# Patient Record
Sex: Male | Born: 1935 | Race: White | Hispanic: No | Smoking: Never smoker
Health system: Southern US, Community
[De-identification: ages and names within clinical notes are randomized; demographics above are authoritative.]

## PROBLEM LIST (undated history)

## (undated) DIAGNOSIS — I6529 Occlusion and stenosis of unspecified carotid artery: Secondary | ICD-10-CM

## (undated) DIAGNOSIS — C7951 Secondary malignant neoplasm of bone: Secondary | ICD-10-CM

## (undated) DIAGNOSIS — K589 Irritable bowel syndrome without diarrhea: Secondary | ICD-10-CM

## (undated) DIAGNOSIS — N400 Enlarged prostate without lower urinary tract symptoms: Secondary | ICD-10-CM

## (undated) DIAGNOSIS — L570 Actinic keratosis: Secondary | ICD-10-CM

## (undated) DIAGNOSIS — K635 Polyp of colon: Secondary | ICD-10-CM

## (undated) DIAGNOSIS — C61 Malignant neoplasm of prostate: Secondary | ICD-10-CM

## (undated) DIAGNOSIS — Z8546 Personal history of malignant neoplasm of prostate: Secondary | ICD-10-CM

## (undated) DIAGNOSIS — H908 Mixed conductive and sensorineural hearing loss, unspecified: Secondary | ICD-10-CM

## (undated) DIAGNOSIS — M1990 Unspecified osteoarthritis, unspecified site: Secondary | ICD-10-CM

## (undated) DIAGNOSIS — E785 Hyperlipidemia, unspecified: Secondary | ICD-10-CM

## (undated) DIAGNOSIS — R972 Elevated prostate specific antigen [PSA]: Secondary | ICD-10-CM

## (undated) HISTORY — DX: Polyp of colon: K63.5

## (undated) HISTORY — DX: Unspecified osteoarthritis, unspecified site: M19.90

## (undated) HISTORY — DX: Irritable bowel syndrome, unspecified: K58.9

## (undated) HISTORY — DX: Benign prostatic hyperplasia without lower urinary tract symptoms: N40.0

## (undated) HISTORY — DX: Actinic keratosis: L57.0

## (undated) HISTORY — DX: Hyperlipidemia, unspecified: E78.5

## (undated) HISTORY — DX: Mixed conductive and sensorineural hearing loss, unspecified: H90.8

## (undated) HISTORY — PX: CARDIOVASCULAR STRESS TEST: SHX262

## (undated) HISTORY — DX: Elevated prostate specific antigen (PSA): R97.20

---

## 2000-07-25 ENCOUNTER — Encounter: Payer: Self-pay | Admitting: Urology

## 2000-07-29 ENCOUNTER — Encounter (INDEPENDENT_AMBULATORY_CARE_PROVIDER_SITE_OTHER): Payer: Self-pay | Admitting: Specialist

## 2000-07-29 ENCOUNTER — Inpatient Hospital Stay (HOSPITAL_COMMUNITY): Admission: RE | Admit: 2000-07-29 | Discharge: 2000-07-31 | Payer: Self-pay | Admitting: Urology

## 2000-07-29 HISTORY — PX: TRANSURETHRAL RESECTION OF PROSTATE: SHX73

## 2004-09-26 ENCOUNTER — Encounter: Admission: RE | Admit: 2004-09-26 | Discharge: 2004-09-26 | Payer: Self-pay | Admitting: Urology

## 2004-10-08 ENCOUNTER — Encounter: Admission: RE | Admit: 2004-10-08 | Discharge: 2004-10-08 | Payer: Self-pay | Admitting: Urology

## 2005-03-19 ENCOUNTER — Ambulatory Visit: Payer: Self-pay | Admitting: Internal Medicine

## 2005-03-29 ENCOUNTER — Ambulatory Visit: Payer: Self-pay | Admitting: Internal Medicine

## 2005-09-20 ENCOUNTER — Ambulatory Visit: Payer: Self-pay | Admitting: Internal Medicine

## 2005-09-27 ENCOUNTER — Ambulatory Visit: Payer: Self-pay | Admitting: Internal Medicine

## 2006-01-17 ENCOUNTER — Ambulatory Visit: Payer: Self-pay | Admitting: Internal Medicine

## 2006-02-11 ENCOUNTER — Inpatient Hospital Stay (HOSPITAL_COMMUNITY): Admission: RE | Admit: 2006-02-11 | Discharge: 2006-02-15 | Payer: Self-pay | Admitting: Orthopedic Surgery

## 2006-02-11 HISTORY — PX: TOTAL KNEE ARTHROPLASTY: SHX125

## 2006-03-20 ENCOUNTER — Encounter
Admission: RE | Admit: 2006-03-20 | Discharge: 2006-06-18 | Payer: Self-pay | Admitting: Physical Medicine & Rehabilitation

## 2006-03-20 ENCOUNTER — Ambulatory Visit: Payer: Self-pay | Admitting: Physical Medicine & Rehabilitation

## 2006-05-13 ENCOUNTER — Ambulatory Visit: Payer: Self-pay | Admitting: Internal Medicine

## 2006-05-20 ENCOUNTER — Ambulatory Visit: Payer: Self-pay | Admitting: Internal Medicine

## 2007-01-01 ENCOUNTER — Ambulatory Visit: Payer: Self-pay | Admitting: Internal Medicine

## 2007-01-01 LAB — CONVERTED CEMR LAB
ALT: 17 units/L (ref 0–40)
AST: 21 units/L (ref 0–37)
Albumin: 4.1 g/dL (ref 3.5–5.2)
Alkaline Phosphatase: 67 units/L (ref 39–117)
BUN: 25 mg/dL — ABNORMAL HIGH (ref 6–23)
GFR calc non Af Amer: 68 mL/min
Glomerular Filtration Rate, Af Am: 82 mL/min/{1.73_m2}
HCT: 39.2 % (ref 39.0–52.0)
MCHC: 35.4 g/dL (ref 30.0–36.0)
Potassium: 4.1 meq/L (ref 3.5–5.1)
RDW: 12.9 % (ref 11.5–14.6)
Sodium: 137 meq/L (ref 135–145)
Total Bilirubin: 0.7 mg/dL (ref 0.3–1.2)
Total Protein: 6.8 g/dL (ref 6.0–8.3)
WBC: 5.9 10*3/uL (ref 4.5–10.5)

## 2007-01-02 ENCOUNTER — Ambulatory Visit: Payer: Self-pay | Admitting: Internal Medicine

## 2007-05-12 ENCOUNTER — Inpatient Hospital Stay (HOSPITAL_COMMUNITY): Admission: RE | Admit: 2007-05-12 | Discharge: 2007-05-15 | Payer: Self-pay | Admitting: Neurosurgery

## 2007-05-12 HISTORY — PX: OTHER SURGICAL HISTORY: SHX169

## 2007-07-07 ENCOUNTER — Ambulatory Visit: Payer: Self-pay | Admitting: Internal Medicine

## 2007-07-08 LAB — CONVERTED CEMR LAB
AST: 21 units/L (ref 0–37)
Albumin: 4.4 g/dL (ref 3.5–5.2)
Amylase: 109 units/L (ref 27–131)
CK-MB: 3.7 ng/mL (ref 0.3–4.0)
Total Bilirubin: 0.8 mg/dL (ref 0.3–1.2)
Total Protein: 7 g/dL (ref 6.0–8.3)

## 2007-07-09 ENCOUNTER — Encounter: Payer: Self-pay | Admitting: Internal Medicine

## 2007-08-19 ENCOUNTER — Ambulatory Visit: Payer: Self-pay | Admitting: Internal Medicine

## 2007-08-19 DIAGNOSIS — M545 Low back pain: Secondary | ICD-10-CM

## 2007-08-19 DIAGNOSIS — N400 Enlarged prostate without lower urinary tract symptoms: Secondary | ICD-10-CM

## 2007-08-19 DIAGNOSIS — E785 Hyperlipidemia, unspecified: Secondary | ICD-10-CM

## 2007-08-19 DIAGNOSIS — K219 Gastro-esophageal reflux disease without esophagitis: Secondary | ICD-10-CM

## 2007-08-19 HISTORY — DX: Hyperlipidemia, unspecified: E78.5

## 2007-12-17 ENCOUNTER — Encounter: Payer: Self-pay | Admitting: Internal Medicine

## 2007-12-18 ENCOUNTER — Ambulatory Visit: Payer: Self-pay | Admitting: Internal Medicine

## 2007-12-18 LAB — CONVERTED CEMR LAB: LDL Goal: 160 mg/dL

## 2007-12-25 ENCOUNTER — Telehealth (INDEPENDENT_AMBULATORY_CARE_PROVIDER_SITE_OTHER): Payer: Self-pay | Admitting: *Deleted

## 2007-12-29 ENCOUNTER — Encounter: Payer: Self-pay | Admitting: Internal Medicine

## 2008-01-13 ENCOUNTER — Telehealth: Payer: Self-pay | Admitting: Internal Medicine

## 2008-01-15 ENCOUNTER — Telehealth: Payer: Self-pay | Admitting: Internal Medicine

## 2008-04-15 ENCOUNTER — Telehealth: Payer: Self-pay | Admitting: Internal Medicine

## 2008-04-15 ENCOUNTER — Ambulatory Visit: Payer: Self-pay | Admitting: Internal Medicine

## 2008-05-31 ENCOUNTER — Ambulatory Visit: Payer: Self-pay | Admitting: Internal Medicine

## 2008-05-31 LAB — CONVERTED CEMR LAB
HDL: 39 mg/dL (ref >39.0)
PSA: 7.88 ng/mL — ABNORMAL HIGH (ref 0.10–4.00)
Total CHOL/HDL Ratio: 4
Triglycerides: 77 mg/dL (ref 0–149)
VLDL: 15 mg/dL (ref 0–40)

## 2008-06-13 ENCOUNTER — Ambulatory Visit: Payer: Self-pay | Admitting: Internal Medicine

## 2008-06-13 DIAGNOSIS — L570 Actinic keratosis: Secondary | ICD-10-CM

## 2008-06-13 HISTORY — DX: Actinic keratosis: L57.0

## 2008-06-16 ENCOUNTER — Encounter: Payer: Self-pay | Admitting: Internal Medicine

## 2008-06-23 ENCOUNTER — Encounter: Payer: Self-pay | Admitting: Internal Medicine

## 2008-10-13 ENCOUNTER — Ambulatory Visit: Payer: Self-pay | Admitting: Internal Medicine

## 2008-12-16 ENCOUNTER — Encounter: Payer: Self-pay | Admitting: Internal Medicine

## 2009-02-10 ENCOUNTER — Ambulatory Visit: Payer: Self-pay | Admitting: Internal Medicine

## 2009-02-10 DIAGNOSIS — C44300 Unspecified malignant neoplasm of skin of unspecified part of face: Secondary | ICD-10-CM | POA: Insufficient documentation

## 2009-06-07 ENCOUNTER — Ambulatory Visit: Payer: Self-pay | Admitting: Internal Medicine

## 2009-06-07 LAB — CONVERTED CEMR LAB
ALT: 21 units/L (ref 0–53)
AST: 24 units/L (ref 0–37)
Albumin: 3.9 g/dL (ref 3.5–5.2)
BUN: 24 mg/dL — ABNORMAL HIGH (ref 6–23)
Basophils Relative: 0 % (ref 0.0–3.0)
Chloride: 111 meq/L (ref 96–112)
Cholesterol: 134 mg/dL (ref 0–200)
Creatinine, Ser: 1 mg/dL (ref 0.4–1.5)
Eosinophils Relative: 1.8 % (ref 0.0–5.0)
GFR calc non Af Amer: 75.49 mL/min (ref >60)
Glucose, Bld: 89 mg/dL (ref 70–99)
HCT: 37.5 % — ABNORMAL LOW (ref 39.0–52.0)
Lymphs Abs: 1.3 10*3/uL (ref 0.7–4.0)
MCHC: 34.8 g/dL (ref 30.0–36.0)
MCV: 90.3 fL (ref 78.0–100.0)
Monocytes Absolute: 0.4 10*3/uL (ref 0.1–1.0)
Neutro Abs: 2.6 10*3/uL (ref 1.4–7.7)
Potassium: 4.1 meq/L (ref 3.5–5.1)
RBC: 4.15 M/uL — ABNORMAL LOW (ref 4.22–5.81)
Triglycerides: 70 mg/dL (ref 0.0–149.0)
WBC: 4.4 10*3/uL — ABNORMAL LOW (ref 4.5–10.5)

## 2009-06-14 ENCOUNTER — Ambulatory Visit: Payer: Self-pay | Admitting: Internal Medicine

## 2009-06-14 LAB — CONVERTED CEMR LAB
LDL Goal: 130 mg/dL
PSA, Free Pct: 8 — ABNORMAL LOW (ref >25)
PSA, Free: 0.8 ng/mL

## 2009-07-25 ENCOUNTER — Telehealth: Payer: Self-pay | Admitting: Internal Medicine

## 2009-07-25 DIAGNOSIS — H908 Mixed conductive and sensorineural hearing loss, unspecified: Secondary | ICD-10-CM | POA: Insufficient documentation

## 2009-07-25 HISTORY — DX: Mixed conductive and sensorineural hearing loss, unspecified: H90.8

## 2009-08-09 ENCOUNTER — Telehealth: Payer: Self-pay | Admitting: *Deleted

## 2009-08-11 ENCOUNTER — Encounter: Payer: Self-pay | Admitting: Internal Medicine

## 2009-11-07 ENCOUNTER — Encounter (INDEPENDENT_AMBULATORY_CARE_PROVIDER_SITE_OTHER): Payer: Self-pay | Admitting: *Deleted

## 2009-12-04 ENCOUNTER — Ambulatory Visit: Payer: Self-pay | Admitting: Internal Medicine

## 2009-12-04 ENCOUNTER — Encounter (INDEPENDENT_AMBULATORY_CARE_PROVIDER_SITE_OTHER): Payer: Self-pay | Admitting: *Deleted

## 2009-12-04 LAB — CONVERTED CEMR LAB
Bilirubin, Direct: 0.2 mg/dL (ref 0.0–0.3)
HDL: 42.8 mg/dL (ref >39.00)
PSA: 10.03 ng/mL — ABNORMAL HIGH (ref 0.10–4.00)
Total Bilirubin: 1.1 mg/dL (ref 0.3–1.2)
Total CHOL/HDL Ratio: 3
Total Protein: 6.8 g/dL (ref 6.0–8.3)
Triglycerides: 89 mg/dL (ref 0.0–149.0)
VLDL: 17.8 mg/dL (ref 0.0–40.0)

## 2009-12-27 ENCOUNTER — Ambulatory Visit: Payer: Self-pay | Admitting: Internal Medicine

## 2010-02-23 ENCOUNTER — Telehealth (INDEPENDENT_AMBULATORY_CARE_PROVIDER_SITE_OTHER): Payer: Self-pay | Admitting: *Deleted

## 2010-06-05 ENCOUNTER — Telehealth: Payer: Self-pay | Admitting: Internal Medicine

## 2010-06-08 ENCOUNTER — Ambulatory Visit: Payer: Self-pay | Admitting: Internal Medicine

## 2010-06-08 LAB — CONVERTED CEMR LAB
Albumin: 4.2 g/dL (ref 3.5–5.2)
Bilirubin, Direct: 0.1 mg/dL (ref 0.0–0.3)
CO2: 31 meq/L (ref 19–32)
Calcium: 9.6 mg/dL (ref 8.4–10.5)
Creatinine, Ser: 1.1 mg/dL (ref 0.4–1.5)
GFR calc non Af Amer: 68.18 mL/min (ref >60)
HDL: 45.9 mg/dL (ref >39.00)
Total CHOL/HDL Ratio: 3
Total Protein: 6.6 g/dL (ref 6.0–8.3)
Triglycerides: 84 mg/dL (ref 0.0–149.0)

## 2010-06-15 ENCOUNTER — Ambulatory Visit: Payer: Self-pay | Admitting: Internal Medicine

## 2010-06-15 DIAGNOSIS — R972 Elevated prostate specific antigen [PSA]: Secondary | ICD-10-CM

## 2010-06-15 HISTORY — DX: Elevated prostate specific antigen (PSA): R97.20

## 2010-06-18 ENCOUNTER — Telehealth: Payer: Self-pay | Admitting: Internal Medicine

## 2010-06-27 ENCOUNTER — Encounter: Payer: Self-pay | Admitting: Internal Medicine

## 2010-09-07 ENCOUNTER — Encounter: Payer: Self-pay | Admitting: Internal Medicine

## 2010-09-10 ENCOUNTER — Telehealth: Payer: Self-pay | Admitting: Internal Medicine

## 2010-09-10 DIAGNOSIS — R0609 Other forms of dyspnea: Secondary | ICD-10-CM

## 2010-09-10 DIAGNOSIS — R0989 Other specified symptoms and signs involving the circulatory and respiratory systems: Secondary | ICD-10-CM

## 2010-09-19 ENCOUNTER — Telehealth (INDEPENDENT_AMBULATORY_CARE_PROVIDER_SITE_OTHER): Payer: Self-pay | Admitting: *Deleted

## 2010-09-20 ENCOUNTER — Encounter: Payer: Self-pay | Admitting: Internal Medicine

## 2010-09-20 ENCOUNTER — Ambulatory Visit: Payer: Self-pay | Admitting: Internal Medicine

## 2010-09-20 ENCOUNTER — Encounter (HOSPITAL_COMMUNITY): Admission: RE | Admit: 2010-09-20 | Discharge: 2010-11-30 | Payer: Self-pay | Admitting: Internal Medicine

## 2010-09-20 ENCOUNTER — Ambulatory Visit: Payer: Self-pay

## 2010-09-21 ENCOUNTER — Encounter: Admission: RE | Admit: 2010-09-21 | Discharge: 2010-09-21 | Payer: Self-pay | Admitting: Neurology

## 2010-09-21 ENCOUNTER — Encounter: Payer: Self-pay | Admitting: Internal Medicine

## 2010-09-24 ENCOUNTER — Encounter: Payer: Self-pay | Admitting: Internal Medicine

## 2010-10-02 ENCOUNTER — Ambulatory Visit: Payer: Self-pay | Admitting: Internal Medicine

## 2010-10-02 DIAGNOSIS — R42 Dizziness and giddiness: Secondary | ICD-10-CM

## 2010-10-04 ENCOUNTER — Encounter: Payer: Self-pay | Admitting: Internal Medicine

## 2010-10-22 ENCOUNTER — Telehealth: Payer: Self-pay | Admitting: Internal Medicine

## 2010-10-23 ENCOUNTER — Telehealth: Payer: Self-pay | Admitting: Internal Medicine

## 2010-11-05 ENCOUNTER — Ambulatory Visit: Payer: Self-pay | Admitting: Internal Medicine

## 2010-12-07 ENCOUNTER — Ambulatory Visit: Payer: Self-pay | Admitting: Internal Medicine

## 2011-01-22 ENCOUNTER — Encounter: Payer: Self-pay | Admitting: *Deleted

## 2011-01-28 ENCOUNTER — Other Ambulatory Visit: Payer: Self-pay | Admitting: Internal Medicine

## 2011-01-28 ENCOUNTER — Ambulatory Visit
Admission: RE | Admit: 2011-01-28 | Discharge: 2011-01-28 | Payer: Self-pay | Source: Home / Self Care | Attending: Internal Medicine | Admitting: Internal Medicine

## 2011-01-28 LAB — BASIC METABOLIC PANEL
BUN: 35 mg/dL — ABNORMAL HIGH (ref 6–23)
Chloride: 101 mEq/L (ref 96–112)
Glucose, Bld: 85 mg/dL (ref 70–99)
Potassium: 4.1 mEq/L (ref 3.5–5.1)

## 2011-01-28 LAB — LIPID PANEL
HDL: 42.9 mg/dL (ref >39.00)
VLDL: 22.2 mg/dL (ref 0.0–40.0)

## 2011-01-28 LAB — HEPATIC FUNCTION PANEL
Bilirubin, Direct: 0.1 mg/dL (ref 0.0–0.3)
Total Bilirubin: 0.7 mg/dL (ref 0.3–1.2)

## 2011-01-29 NOTE — Letter (Signed)
Summary: [facility]  [facility]   Imported By: Maryln Gottron 07/03/2010 09:55:13  _____________________________________________________________________  External Attachment:    Type:   Image     Comment:   External Document

## 2011-01-29 NOTE — Miscellaneous (Signed)
Summary: Appointment Canceled  Appointment status changed to canceled by [employer] on 09/13/2010 10:07 AM.  Cancellation Comments --------------------- crs/ wt 152/780.2/syncope/mt  Appointment Information ----------------------- Appt Type:  CARDIOLOGY NUCLEAR TESTING      Date:  Wednesday, September 19, 2010      Time:  8:30 AM for 15 min   Urgency:  Routine   Made By:  Pearson Grippe  To Visit:  [id number]    Reason:  crs/ wt 152/780.2/syncope/mt  Appt Comments ------------- -- 09/13/10 10:07: (CEMR) CANCELED -- crs/ wt 152/780.2/syncope/mt -- 09/12/10 15:01: (CEMR) BOOKED -- Routine CARDIOLOGY NUCLEAR TESTING at 09/19/2010 8:30 AM for 15 min crs/ wt 152/780.2/syncope/mt -- 09/12/10 14:57: (CEM[facility]) BOOKED -- Routine CARDIOLO

## 2011-01-29 NOTE — Progress Notes (Signed)
  Phone Note Call from Patient   Summary of Call: Refill Jaylyn to [facility]. Initial call taken by: [facility] CMA AAMA,  October 23, 2010 8:57 AM    Prescriptions: JALYN 0.5-0.4 MG CAPS (DUTASTERIDE-TAMSULOSIN HCL) one by mouth daily  #90 x 3   Entered by:   Lynann Beaver CMA AAMA   Authorized by:   Stacie Glaze MD   Signed by:   Lynann Beaver CMA AAMA on 10/23/2010   Method used:   Faxed to ...       [facility] (mail-order)             , [place]         Ph:        Fax: [phone]   RxID:   [id number]  Per pt request.

## 2011-01-29 NOTE — Assessment & Plan Note (Signed)
Summary: 1 month rov/njr   Vital Signs:  Patient profile:   77 year old male Height:      63 inches Weight:      150 pounds BMI:     26.67 Temp:     98.2 degrees F oral Pulse rate:   72 / minute Resp:     14 per minute BP sitting:   130 / 70  (left arm)  Vitals Entered By: Willy Eddy, LPN (November 05, 2010 4:18 PM) CC: roa Is Patient Diabetic? No   Primary Care Provider:  Stacie Glaze MD  CC:  roa.  History of Present Illness: follow up labs for lipids, he has been follow for chronic recurrent "dizzyness" possibly due to medications back pain stable GERD stable prostate symptoms did not respoend to jaylyn and dizzyness worsened  Hyperlipidemia Follow-Up      This is an 77 year old man who presents for Hyperlipidemia follow-up.  The patient denies muscle aches, GI upset, abdominal pain, flushing, itching, constipation, diarrhea, and fatigue.  The patient denies the following symptoms: chest pain/pressure, exercise intolerance, dypsnea, palpitations, syncope, and pedal edema.  Compliance with medications (by patient report) has been near 100%.  Dietary compliance has been good.  The patient reports exercising 3-4X per week.  Adjunctive measures currently used by the patient include fish oil supplements and limiting alcohol consumpton.    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) 7)  Actinic Keratosis, Ear, Left  (ICD-702.0) 8)  Acute Bronchitis  (ICD-466.0) 9)  Benign Prostatic Hypertrophy  (ICD-600.00) 10)  Low Back Pain  (ICD-724.2) 11)  Hyperlipidemia   (ICD-272.4) 12)  Gerd  (ICD-530.81)  Current Problems (verified): 1)  Dizziness, Chronic  (ICD-780.4) 2)  Dyspnea On Exertion  (ICD-786.09) 3)  Syncope  (ICD-780.2) 4)  Prostate Specific Antigen, Elevated, 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 Take 1 Tablet By Mouth Once A Day 2)  Pantoprazole Sodium 40 Mg Tbec (Pantoprazole Sodium) .... One Po  Daily 3)  Celebrex 200 Mg Caps (Celecoxib) .... One By Mouth Daily 4)  Reglan 5 Mg  Tabs (Metoclopramide Hcl) .... Add With Midrin For Headaches As Needed 5)  Midrin 325-65-100 Mg  Caps (Apap-Isometheptene-Dichloral) .... Add With Reglan As Needed Headache 6)  Meclizine Hcl 25 Mg Tabs (Meclizine Hcl) .... One By Mouth Three Times A Day 7)  Carisoprodol 350 Mg Tabs (Carisoprodol) .... One By Mouth Q 8 Hours As Needed Spasm 8)  Jalyn 0.5-0.4 Mg Caps (Dutasteride-Tamsulosin Hcl) .... One By Mouth Daily  Current Medications (verified): 1)  Celebrex 200 Mg Caps (Celecoxib) .... One By Mouth Daily 2)  Midrin 325-65-100 Mg  Caps (Apap-Isometheptene-Dichloral) .... Add With Reglan As  Needed Headache 3)  Meclizine Hcl 25 Mg Tabs (Meclizine Hcl) .... One By Mouth Three Times A Day 4)  Carisoprodol 350 Mg Tabs (Carisoprodol) .... One By Mouth Q 8 Hours As Needed Spasm 5)  Jalyn 0.5-0.4 Mg Caps (Dutasteride-Tamsulosin Hcl) .... One By Mouth Daily  Allergies (verified): No Known Drug Allergies  Past History:  Family History: Last updated: 08/03/2007 Family History of Arthritis Family History Other cancer-Colon, Prostate  Social History: Last updated:  08/03/2007 Never Smoked Alcohol use-no Drug use-no Retired  Risk Factors: Smoking Status: never (11/05/2010)  Past medical, surgical, family and social histories (including risk factors) reviewed, and no changes noted (except as noted below).  Past Medical History: Reviewed history from 10/13/2008 and no changes required. Elevated Psa  ( john wren) Migraine Headaches Lumbar disc dz GERD Hyperlipidemia Low back pain hearing loss prostate cancer  Past Surgical History: Reviewed history from 08/03/2007 and no changes required. Colonoscopy-07/05/2004 Total knee replacement-lt 02/11/2006 Transurethral resection of prostate-1980  Family History: Reviewed history from 08/03/2007 and no changes required. Family History of Arthritis Family History Other cancer-Colon, Prostate  Social History: Reviewed history from 08/03/2007 and no changes required. Never Smoked Alcohol use-no Drug use-no Retired  Review of Systems  The patient denies anorexia, fever, weight loss, weight gain, vision loss, decreased hearing, hoarseness, chest pain, syncope, dyspnea on exertion, peripheral edema, prolonged cough, headaches, hemoptysis, abdominal pain, melena, hematochezia, severe indigestion/heartburn, hematuria, incontinence, genital sores, muscle weakness, suspicious skin lesions, transient blindness, difficulty walking, depression, unusual weight change, abnormal bleeding, enlarged lymph nodes, angioedema, breast masses, and testicular masses.    Physical Exam  General:  Well-developed,well-nourished,in no acute distress; alert,appropriate and cooperative throughout examination Head:  normocephalic and atraumatic.   Eyes:  pupils equal and pupils round.   Ears:  R ear normal and L ear normal.   Nose:  no external deformity and no nasal discharge.   Neck:  No deformities, masses, or tenderness noted. Lungs:  Normal respiratory effort, chest expands symmetrically. Lungs are clear to  auscultation, no crackles or wheezes. Heart:  Normal rate and regular rhythm. S1 and S2 normal without gallop, murmur, click, rub or other extra sounds.   Impression & Recommendations:  Problem # 1:  DIZZINESS, CHRONIC (ICD-780.4)  improved His updated medication list for this problem includes:    Meclizine Hcl 25 Mg Tabs (Meclizine hcl) ..... One by mouth three times a day  Demonstrated maneuvers to self-treat vertigo. Patient to call to be seen if no improvement in 10-14 days, sooner if worse.   Problem # 2:  HYPERLIPIDEMIA (ICD-272.4) Assessment: Unchanged  The following medications were removed from the medication list:    Lipitor 20 Mg Tabs (Atorvastatin calcium) .Marland Kitchen... Take 1 tablet by mouth once a day  Labs Reviewed: SGOT: 21 (06/08/2010)   SGPT: 17 (06/08/2010)  Lipid Goals: Chol Goal: 200 (06/14/2009)   HDL Goal: 40 (06/14/2009)   LDL Goal: 130 (06/14/2009)   TG Goal: 150 (06/14/2009)  Prior 10 Yr Risk Heart Disease: 11 % (06/15/2010)   HDL:45.90 (06/08/2010), 42.80 (12/04/2009)  LDL:94 (06/08/2010), 86 (12/04/2009)  Chol:157 (06/08/2010), 147 (12/04/2009)  Trig:84.0 (06/08/2010), 89.0 (12/04/2009)  Problem # 3:  HYPERLIPIDEMIA (ICD-272.4)  The following medications were removed from the medication list:    Lipitor 20 Mg Tabs (Atorvastatin calcium) .Marland Kitchen... Take 1 tablet by mouth once a day  Labs Reviewed: SGOT: 21 (06/08/2010)   SGPT: 17 (06/08/2010)  Lipid Goals: Chol Goal: 200 (06/14/2009)   HDL Goal: 40 (06/14/2009)  LDL Goal: 130 (06/14/2009)   TG Goal: 150 (06/14/2009)  Prior 10 Yr Risk Heart Disease: 11 % (06/15/2010)   HDL:45.90 (06/08/2010), 42.80 (12/04/2009)  LDL:94 (06/08/2010), 86 (12/04/2009)  Chol:157 (06/08/2010), 147 (12/04/2009)  Trig:84.0 (06/08/2010), 89.0 (12/04/2009)  Problem # 4:  GERD (ICD-530.81)  The following medications were removed from the medication list:    Pantoprazole Sodium 40 Mg Tbec (Pantoprazole sodium) ..... One po   daily  Problem # 5:  LOW BACK PAIN (ICD-724.2)  His updated medication list for this problem includes:    Celebrex 200 Mg Caps (Celecoxib) ..... One by mouth daily    Carisoprodol 350 Mg Tabs (Carisoprodol) ..... One by mouth q 8 hours as needed spasm  Complete Medication List: 1)  Celebrex 200 Mg Caps (Celecoxib) .... One by mouth daily 2)  Midrin 325-65-100 Mg Caps (Apap-isometheptene-dichloral) .... Add with reglan as needed headache 3)  Meclizine Hcl 25 Mg Tabs (Meclizine hcl) .... One by mouth three times a day 4)  Carisoprodol 350 Mg Tabs (Carisoprodol) .... One by mouth q 8 hours as needed spasm 5)  Jalyn 0.5-0.4 Mg Caps (Dutasteride-tamsulosin hcl) .... One by mouth daily  Patient Instructions: 1)  Please schedule a follow-up appointment in 3 months.  Medicare wellness exam fasting 2)  BMP prior to visit, ICD-9:401.90 3)  Hepatic Panel prior to visit, ICD-9:995.20 4)  Lipid Panel prior to visit, ICD-9:272.4 5)  TSH prior to visit, ICD-9:272.4   Orders Added: 1)  Est. Patient Level IV [16109]

## 2011-01-29 NOTE — Letter (Signed)
Summary: [facility]  [facility]   Imported By: Maryln Gottron 09/24/2010 12:35:18  _____________________________________________________________________  External Attachment:    Type:   Image     Comment:   External Document

## 2011-01-29 NOTE — Progress Notes (Signed)
Summary: labs  Phone Note Call from Patient   Caller: Patient Call For: Stacie Glaze MD Complaint: Urinary/GYN Problems Summary of Call: Pt. wants to add a PSA to his labs for Friday, and have it all done at [facility]. Initial call taken by: Lynann Beaver CMA,  June 05, 2010 1:42 PM  Follow-up for Phone Call        please add psa with code of 600.00 Follow-up by: Willy Eddy, LPN,  June 05, 6212 1:46 PM  Additional Follow-up for Phone Call Additional follow up Details #1::        Contacted pt to confirm and appt was changed to [facility] lab for this Friday, 06/08/2010 at 10 am... with PSA added to requested labs.  Additional Follow-up by: Debbra Riding,  June 05, 2010 3:15 PM

## 2011-01-29 NOTE — Progress Notes (Signed)
Summary: copy of labs  Phone Note Call from Patient   Caller: Patient Call For: Stacie Glaze MD Summary of Call: Needs copy of lab results, please. Initial call taken by: Lynann Beaver CMA,  June 18, 2010 10:43 AM  Follow-up for Phone Call        was given at [facility] left message on machine  Follow-up by: Willy Eddy, LPN,  June 18, 2010 12:55 PM

## 2011-01-29 NOTE — Letter (Signed)
Summary: [facility]  [facility]   Imported By: Sherian Rein 11/14/2010 10:00:01  _____________________________________________________________________  External Attachment:    Type:   Image     Comment:   External Document

## 2011-01-29 NOTE — Progress Notes (Signed)
Summary: stress test  Phone Note Call from Patient Call back at Home Phone [phone]   Caller: Patient Call For: Stacie Glaze MD Reason for Call: Refill Medication Summary of Call: Pt wants to see Dr. Lovell Sheehan the week of the 19 th. to discuss scheduling a stress test per Dr. Cherie Ouch suggestion. Initial call taken by: Lynann Beaver CMA,  September 10, 2010 1:48 PM  Follow-up for Phone Call        per dr Tenny Craw- talk with pt and find out his dx and his problem- left message on machine to return cal  Willy Eddy, LPN  September 11, 2010 5:02 PM  Follow-up by: Willy Eddy, LPN,  September 11, 2010 5:02 PM  Additional Follow-up for Phone Call Additional follow up Details #1::        pt is c/o exertional syncope and exertional sob and discussed these signs with dr lewitt and he feels he needs a stress test-pt states he can walk on treadmill-per drjenkins- may have stress myoview Additional Follow-up by: Willy Eddy, LPN,  September 12, 2010 9:07 AM  New Problems: DYSPNEA ON EXERTION (ICD-786.09) SYNCOPE (ICD-780.2)   New Problems: DYSPNEA ON EXERTION (ICD-786.09) SYNCOPE (ICD-780.2)

## 2011-01-29 NOTE — Assessment & Plan Note (Signed)
Summary: follow up/cjr   Vital Signs:  Patient profile:   77 year old male Height:      63 inches Weight:      149 pounds BMI:     26.49 Temp:     98.2 degrees F oral Pulse rate:   72 / minute Resp:     14 per minute BP sitting:   130 / 80  (left arm)  Vitals Entered By: Willy Eddy, LPN (October 02, 2010 4:54 PM) CC: roa--discuss test Is Patient Diabetic? No   Primary Care Octavian Godek:  Stacie Glaze MD  CC:  roa--discuss test.  History of Present Illness: the pt had a slight hypotensive episode post a normal cardiolyte but he is on HCTZ and terozosin for the prostate ( BPH) The pt has episodic dizzyness and vertigo the pt is on hytrin and this may be causing the dizzyness the cardiolyte was to help evaluate the casued of syncopy and presycope     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) 7)  Actinic Keratosis, Ear, Left  (ICD-702.0) 8)  Acute Bronchitis  (ICD-466.0) 9)  Benign Prostatic Hypertrophy  (ICD-600.00) 10)  Low Back Pain  (ICD-724.2) 11)  Hyperlipidemia  (ICD-272.4) 12)  Gerd  (ICD-530.81)  Current Problems (verified): 1)  Dizziness, Chronic  (ICD-780.4) 2)  Dyspnea On Exertion  (ICD-786.09) 3)  Syncope  (ICD-780.2) 4)  Prostate Specific Antigen, Elevated, 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 Take 1 Tablet By Mouth Once A Day 2)  Lipitor 20 Mg  Tabs (Atorvastatin Calcium) .... Take 1 Tablet By Mouth Once A Day 3)  Hytrin 10 Mg  Caps (Terazosin Hcl) .... Take 1 Tablet By Mouth Once A Day 4)  Proscar 5 Mg  Tabs (Finasteride) .... Once Daily 5)  Pantoprazole Sodium 40 Mg Tbec (Pantoprazole Sodium) .... One Po  Daily 6)  Celebrex 200 Mg Caps (Celecoxib) .... One By Mouth Daily 7)  Reglan 5 Mg  Tabs (Metoclopramide Hcl) .... Add With Midrin For Headaches As Needed 8)  Midrin 325-65-100 Mg  Caps (Apap-Isometheptene-Dichloral) .... Add With Reglan As Needed Headache 9)  Terazosin Hcl 10 Mg Caps (Terazosin Hcl) .Marland Kitchen.. 1 Once Daily 10)  Hydroxyzine Hcl 10 Mg Tabs (Hydroxyzine Hcl) .Marland Kitchen.. 1 Once Daily 11)  Carisoprodol 350 Mg Tabs (Carisoprodol) .... One By Mouth Q 8 Hours As Needed Spasm  Current Medications (verified): 1)  Lipitor 20 Mg  Tabs (Atorvastatin Calcium) .... Take 1 Tablet By Mouth Once A Day 2)  Pantoprazole Sodium 40 Mg Tbec (Pantoprazole Sodium) .... One Po  Daily 3)  Celebrex 200 Mg Caps (Celecoxib) .... One By Mouth Daily 4)  Reglan 5 Mg  Tabs (Metoclopramide Hcl) .Marland KitchenMarland KitchenMarland Kitchen  Add With Midrin For Headaches As Needed 5)  Midrin 325-65-100 Mg  Caps (Apap-Isometheptene-Dichloral) .... Add With Reglan As Needed Headache 6)  Meclizine Hcl 25 Mg Tabs (Meclizine Hcl) .... One By Mouth Three Times A Day 7)  Carisoprodol 350 Mg Tabs (Carisoprodol) .... One By Mouth Q 8 Hours As Needed Spasm 8)  Jalyn 0.5-0.4 Mg Caps (Dutasteride-Tamsulosin Hcl) .... One By Mouth Daily  Allergies (verified): No Known Drug Allergies  Past History:  Family History: Last updated: 08/03/2007 Family History of Arthritis Family History Other cancer-Colon, Prostate  Social History: Last updated: 08/03/2007 Never Smoked Alcohol use-no Drug  use-no Retired  Risk Factors: Smoking Status: never (10/02/2010)  Past medical, surgical, family and social histories (including risk factors) reviewed, and no changes noted (except as noted below).  Past Medical History: Reviewed history from 10/13/2008 and no changes required. Elevated Psa  ( john wren) Migraine Headaches Lumbar disc dz GERD Hyperlipidemia Low back pain hearing loss prostate cancer  Past Surgical History: Reviewed history from 08/03/2007 and no changes required. Colonoscopy-07/05/2004 Total knee replacement-lt 02/11/2006 Transurethral resection of prostate-1980  Family History: Reviewed history from 08/03/2007 and no changes required. Family History of Arthritis Family History Other cancer-Colon, Prostate  Social History: Reviewed history from 08/03/2007 and no changes required. Never Smoked Alcohol use-no Drug use-no Retired  Review of Systems  The patient denies anorexia, fever, weight loss, weight gain, vision loss, decreased hearing, hoarseness, chest pain, syncope, dyspnea on exertion, peripheral edema, prolonged cough, headaches, hemoptysis, abdominal pain, melena, hematochezia, severe indigestion/heartburn, hematuria, incontinence, genital sores, muscle weakness, suspicious skin lesions, transient blindness, difficulty walking, depression, unusual weight change, abnormal bleeding, enlarged lymph nodes, angioedema, breast masses, and testicular masses.    Physical Exam  General:  Well-developed,well-nourished,in no acute distress; alert,appropriate and cooperative throughout examination Head:  normocephalic and atraumatic.   Eyes:  pupils equal and pupils round.   Ears:  R ear normal and L ear normal.   Neck:  No deformities, masses, or tenderness noted. Lungs:  Normal respiratory effort, chest expands symmetrically. Lungs are clear to auscultation, no crackles or wheezes. Heart:  Normal rate and regular rhythm. S1 and S2 normal without gallop,  murmur, click, rub or other extra sounds. Abdomen:  soft, non-tender, and normal bowel sounds.   Msk:  no joint swelling and no joint warmth.   Extremities:  No clubbing, cyanosis, edema, or deformity noted with normal full range of motion of all joints.   Neurologic:  alert & oriented X3 and cranial nerves II-XII intact.     Impression & Recommendations:  Problem # 1:  BENIGN PROSTATIC HYPERTROPHY (ICD-600.00)  The following medications were removed from the medication list:    Hytrin 10 Mg Caps (Terazosin hcl) .Marland Kitchen... Take 1 tablet by mouth once a day    Proscar 5 Mg Tabs (Finasteride) ..... Once daily    Terazosin Hcl 10 Mg Caps (Terazosin hcl) .Marland Kitchen... 1 once daily His updated medication list for this problem includes:    Jalyn 0.5-0.4 Mg Caps (Dutasteride-tamsulosin hcl) ..... One by mouth daily  PSA: 12.36 (06/08/2010)   PSA Free: 0.8 (06/14/2009)     Problem # 2:  DIZZINESS, CHRONIC (ICD-780.4)  Demonstrated maneuvers to self-treat vertigo. Patient to call to be seen if no improvement in 10-14 days, sooner if worse.   His updated medication list for this problem includes:    Meclizine Hcl 25 Mg Tabs (Meclizine hcl) ..... One by mouth three times a day  Problem # 3:  HYPERLIPIDEMIA (  ICD-272.4) consider stopping the lipitor, the pt notes the dizzyness  stpped when he stopped the lipitor His updated medication list for this problem includes:    Lipitor 20 Mg Tabs (Atorvastatin calcium) .Marland Kitchen... Take 1 tablet by mouth once a day  Labs Reviewed: SGOT: 21 (06/08/2010)   SGPT: 17 (06/08/2010)  Lipid Goals: Chol Goal: 200 (06/14/2009)   HDL Goal: 40 (06/14/2009)   LDL Goal: 130 (06/14/2009)   TG Goal: 150 (06/14/2009)  Prior 10 Yr Risk Heart Disease: 11 % (06/15/2010)   HDL:45.90 (06/08/2010), 42.80 (12/04/2009)  LDL:94 (06/08/2010), 86 (12/04/2009)  Chol:157 (06/08/2010), 147 (12/04/2009)  Trig:84.0 (06/08/2010), 89.0 (12/04/2009)  Complete Medication List: 1)  Lipitor 20 Mg Tabs  (Atorvastatin calcium) .... Take 1 tablet by mouth once a day 2)  Pantoprazole Sodium 40 Mg Tbec (Pantoprazole sodium) .... One po  daily 3)  Celebrex 200 Mg Caps (Celecoxib) .... One by mouth daily 4)  Reglan 5 Mg Tabs (Metoclopramide hcl) .... Add with midrin for headaches as needed 5)  Midrin 325-65-100 Mg Caps (Apap-isometheptene-dichloral) .... Add with reglan as needed headache 6)  Meclizine Hcl 25 Mg Tabs (Meclizine hcl) .... One by mouth three times a day 7)  Carisoprodol 350 Mg Tabs (Carisoprodol) .... One by mouth q 8 hours as needed spasm 8)  Jalyn 0.5-0.4 Mg Caps (Dutasteride-tamsulosin hcl) .... One by mouth daily  Patient Instructions: 1)  stop the HCTZ and stop the lipitor 2)  change the hytrin adn prosacr to [employer]. 3)  [place] the meclizine three time s day if the dizzyness persists after this intervention 4)  Please schedule a follow-up appointment in 1 month.

## 2011-01-29 NOTE — Progress Notes (Signed)
Summary: leg cramps  Phone Note Call from Patient   Caller: Patient Call For: Stacie Glaze MD Summary of Call: C/o worsening leg cramps. Wants to try cutting Lipitor to every other day- ok with you? ph [phone], lv msg Initial call taken by: Raechel Ache, RN,  February 23, 2010 1:58 PM  Follow-up for Phone Call        may take 1/2 of lipitor-per dr Lovell Sheehan Follow-up by: Willy Eddy, LPN,  February 23, 2010 3:26 PM  Additional Follow-up for Phone Call Additional follow up Details #1::        Phone Call Completed Additional Follow-up by: Raechel Ache, RN,  February 23, 2010 3:34 PM

## 2011-01-29 NOTE — Progress Notes (Signed)
Summary: Nuclear Pre-Procedure  Phone Note Outgoing Call Call back at [facility] Phone [phone]   Call placed by: Stanton Kidney, EMT-P,  September 19, 2010 2:27 PM Call placed to: Patient Action Taken: Phone Call Completed Summary of Call: Reviewed information on [employer] Information Sheet (see scanned document for further details).  Spoke with the Patient.    Nuclear Med Background Indications for Stress Test: Evaluation for Ischemia     Symptoms: DOE, Syncope    Nuclear Pre-Procedure Cardiac Risk Factors: Lipids Height (in): 65

## 2011-01-29 NOTE — Assessment & Plan Note (Signed)
Summary: Cardiology Nuclear Testing  Nuclear Med Background Indications for Stress Test: Evaluation for Ischemia     Symptoms: DOE, Syncope    Nuclear Pre-Procedure Cardiac Risk Factors: Lipids Caffeine/Decaff Intake: None NPO After: 6:00 PM Lungs: clear IV 0.9% NS with Angio Cath: 20g     IV Site: R Hand IV Started by: Doyne Keel, CNMT Chest Size (in) 38     Height (in): 63 Weight (lb): 147 BMI: 26.13 Tech Comments: The patient had some drop in his BP. No other problems with the test. DOD Dr.McLean was consulted and stated that he could go.  Nuclear Med Study 1 or 2 day study:  1 day     Stress Test Type:  Stress Reading MD:  Arvilla Meres, MD     Referring MD:  J.Jenkins Resting Radionuclide:  Technetium 61m Tetrofosmin     Resting Radionuclide Dose:  11.0 mCi  Stress Radionuclide:  Technetium 44m Tetrofosmin     Stress Radionuclide Dose:  32.5 mCi   Stress Protocol Exercise Time (min):  3:00 min     Max HR:  122 bpm     Predicted Max HR:  134 bpm  Max Systolic BP: 177 mm Hg     Percent Max HR:  91.04 %     METS: 4.6 Rate Pressure Product:  16109    Stress Test Technologist:  Milana Na, EMT-P     Nuclear Technologist:  Domenic Polite, CNMT  Rest Procedure  Myocardial perfusion imaging was performed at rest 45 minutes following the intravenous administration of Technetium 46m Tetrofosmin.  Stress Procedure  The patient exercised for 3:00. The patient stopped due to fatigue and denied any chest pain.  There were no significant ST-T wave changes and rare pvcs.  Technetium 89m Tetrofosmin was injected at peak exercise and myocardial perfusion imaging was performed after a brief delay.  QPS Raw Data Images:  Normal; no motion artifact; normal heart/lung ratio. Stress Images:  Normal homogeneous uptake in all areas of the myocardium. Rest Images:  Normal homogeneous uptake in all areas of the myocardium. Subtraction (SDS):  Normal Transient Ischemic  Dilatation:  1.05  (Normal <1.22)  Lung/Heart Ratio:  .22  (Normal <0.45)  Quantitative Gated Spect Images QGS EDV:  78 ml QGS ESV:  25 ml QGS EF:  68 % QGS cine images:  Normal  Findings Normal nuclear study      Overall Impression  Exercise Capacity: Poor exercise capacity. Clinical Symptoms: No chest pain ECG Impression: No significant ST segment change suggestive of ischemia. Overall Impression: Normal stress nuclear study.

## 2011-01-29 NOTE — Assessment & Plan Note (Signed)
Summary: 6 month rov/nrj   Vital Signs:  Patient profile:   77 year old male Height:      65 inches Weight:      152 pounds BMI:     25.39 Temp:     98.2 degrees F oral Pulse rate:   72 / minute Resp:     14 per minute BP sitting:   140 / 64  (left arm)  Vitals Entered By: Willy Eddy, LPN (June 15, 2010 10:48 AM) CC: roa labs, Lipid Management   CC:  roa labs and Lipid Management.  History of Present Illness: pt has been monitering for lipid monitering of labs   Lipid Management History:      Positive NCEP/ATP III risk factors include male age 28 years old or older.  Negative NCEP/ATP III risk factors include non-tobacco-user status, non-hypertensive, no ASHD (atherosclerotic heart disease), no prior stroke/TIA, no peripheral vascular disease, and no history of aortic aneurysm.     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 Take 1 Tablet By Mouth Once A Day 2)  Lipitor 20 Mg  Tabs (Atorvastatin Calcium) .... Take 1 Tablet By Mouth Once A Day 3)  Hytrin 10 Mg  Caps (Terazosin Hcl) .... Take 1 Tablet By Mouth Once A Day 4)  Proscar 5 Mg  Tabs (Finasteride) .... Once Daily 5)  Pantoprazole Sodium 40 Mg Tbec (Pantoprazole Sodium) .... One Po  Daily 6)  Celebrex 200 Mg Caps (Celecoxib) .... One By Mouth Daily 7)  Reglan 5 Mg  Tabs (Metoclopramide Hcl) .... Add With Midrin For Headaches As Needed 8)  Midrin 325-65-100 Mg  Caps (Apap-Isometheptene-Dichloral) .... Add With  Reglan As Needed Headache 9)  Terazosin Hcl 10 Mg Caps (Terazosin Hcl) .Marland Kitchen.. 1 Once Daily 10)  Hydroxyzine Hcl 10 Mg Tabs (Hydroxyzine Hcl) .Marland Kitchen.. 1 Once Daily 11)  Carisoprodol 350 Mg Tabs (Carisoprodol) .... One By Mouth Q 8 Hours As Needed Spasm  Allergies (verified): No Known Drug Allergies  Past History:  Family History: Last updated: 08/03/2007 Family History of Arthritis Family History Other cancer-Colon, Prostate  Social History: Last updated: 08/03/2007 Never Smoked Alcohol use-no Drug use-no Retired  Risk Factors: Smoking Status: never (06/15/2010)  Past medical, surgical, family and social histories (including risk factors) reviewed, and no changes noted (except as noted below).  Past Medical History: Reviewed history from 10/13/2008 and no changes required. Elevated Psa  ( Aron Inge wren) Migraine Headaches Lumbar disc dz GERD Hyperlipidemia Low back pain hearing loss prostate cancer  Past Surgical History: Reviewed history from 08/03/2007 and no changes required. Colonoscopy-07/05/2004 Total knee replacement-lt 02/11/2006 Transurethral resection of prostate-1980  Family History: Reviewed history from 08/03/2007 and no changes required. Family History of Arthritis Family History Other cancer-Colon, Prostate  Social History: Reviewed history from 08/03/2007 and no changes required. Never Smoked Alcohol use-no Drug use-no Retired  Review of Systems  The patient denies anorexia, fever, weight loss, weight gain, vision loss, decreased hearing, hoarseness, chest pain, syncope, dyspnea on exertion, peripheral edema, prolonged cough, headaches, hemoptysis,  abdominal pain, melena, hematochezia, severe indigestion/heartburn, hematuria, incontinence, genital sores, muscle weakness, suspicious skin lesions, transient blindness, difficulty walking, depression, unusual weight change, abnormal bleeding, enlarged lymph nodes, angioedema, and breast masses.    Physical  Exam  General:  Well-developed,well-nourished,in no acute distress; alert,appropriate and cooperative throughout examination Head:  Normocephalic and atraumatic without obvious abnormalities. No apparent alopecia or balding. Eyes:  pupils equal and pupils round.   Ears:  R ear normal and L ear normal.   Nose:  External nasal examination shows no deformity or inflammation. Nasal mucosa are pink and moist without lesions or exudates. Mouth:  Oral mucosa and oropharynx without lesions or exudates.  Teeth in good repair. Neck:  No deformities, masses, or tenderness noted. Lungs:  Normal respiratory effort, chest expands symmetrically. Lungs are clear to auscultation, no crackles or wheezes. Heart:  Normal rate and regular rhythm. S1 and S2 normal without gallop, murmur, click, rub or other extra sounds. Abdomen:  soft, non-tender, and normal bowel sounds.   Msk:  no joint swelling and no joint warmth.   Pulses:  R and L carotid,radial,femoral,dorsalis pedis and posterior tibial pulses are full and equal bilaterally Extremities:  No clubbing, cyanosis, edema, or deformity noted with normal full range of motion of all joints.   Neurologic:  alert & oriented X3 and cranial nerves II-XII intact.     Impression & Recommendations:  Problem # 1:  HYPERLIPIDEMIA (ICD-272.4) Assessment Unchanged  His updated medication list for this problem includes:    Lipitor 20 Mg Tabs (Atorvastatin calcium) .Marland Kitchen... Take 1 tablet by mouth once a day  pt is talking every other day with good results  Labs Reviewed: SGOT: 21 (06/08/2010)   SGPT: 17 (06/08/2010)  Lipid Goals: Chol Goal: 200 (06/14/2009)   HDL Goal: 40 (06/14/2009)   LDL Goal: 130 (06/14/2009)   TG Goal: 150 (06/14/2009)  10 Yr Risk Heart Disease: 11 % Prior 10 Yr Risk Heart Disease: 9 % (06/14/2009)   HDL:45.90 (06/08/2010), 42.80 (12/04/2009)  LDL:94 (06/08/2010), 86 (12/04/2009)  Chol:157 (06/08/2010), 147 (12/04/2009)  Trig:84.0 (06/08/2010),  89.0 (12/04/2009)  Problem # 2:  GERD (ICD-530.81) Assessment: Unchanged  His updated medication list for this problem includes:    Pantoprazole Sodium 40 Mg Tbec (Pantoprazole sodium) ..... One po  daily  Labs Reviewed: Hgb: 13.0 (06/07/2009)   Hct: 37.5 (06/07/2009)  Problem # 3:  PROSTATE SPECIFIC ANTIGEN, ELEVATED, CHRONIC (ICD-790.93) has an appointment when [facility]   PSA: 12.36 (06/08/2010)   PSA Free: 0.8 (06/14/2009)     Problem # 4:  BENIGN PROSTATIC HYPERTROPHY (ICD-600.00)  His updated medication list for this problem includes:    Hytrin 10 Mg Caps (Terazosin hcl) .Marland Kitchen... Take 1 tablet by mouth once a day    Proscar 5 Mg Tabs (Finasteride) ..... Once daily    Terazosin Hcl 10 Mg Caps (Terazosin hcl) .Marland Kitchen... 1 once daily  Complete Medication List: 1)  Hydrochlorothiazide 12.5 Mg Tabs (Hydrochlorothiazide) .... Take 1 tablet by mouth once a day 2)  Lipitor 20 Mg Tabs (Atorvastatin calcium) .... Take 1 tablet by mouth once a day 3)  Hytrin 10 Mg Caps (Terazosin hcl) .... Take 1 tablet by mouth once a day 4)  Proscar 5 Mg Tabs (Finasteride) .... Once daily 5)  Pantoprazole Sodium 40 Mg Tbec (Pantoprazole sodium) .... One po  daily 6)  Celebrex 200 Mg Caps (Celecoxib) .... One by mouth daily 7)  Reglan 5 Mg Tabs (Metoclopramide hcl) .... Add with midrin for headaches as needed  8)  Midrin 325-65-100 Mg Caps (Apap-isometheptene-dichloral) .... Add with reglan as needed headache 9)  Terazosin Hcl 10 Mg Caps (Terazosin hcl) .Marland Kitchen.. 1 once daily 10)  Hydroxyzine Hcl 10 Mg Tabs (Hydroxyzine hcl) .Marland Kitchen.. 1 once daily 11)  Carisoprodol 350 Mg Tabs (Carisoprodol) .... One by mouth q 8 hours as needed spasm  Lipid Assessment/Plan:      Based on NCEP/ATP III, the patient's risk factor category is "0-1 risk factors".  The patient's lipid goals are as follows: Total cholesterol goal is 200; LDL cholesterol goal is 130; HDL cholesterol goal is 40; Triglyceride goal is 150.  His LDL cholesterol goal  has been met.    Patient Instructions: 1)  Please schedule a follow-up appointment in 6 months. Prescriptions: TERAZOSIN HCL 10 MG CAPS (TERAZOSIN HCL) 1 once daily  #90 x 3   Entered by:   Willy Eddy, LPN   Authorized by:   Stacie Glaze MD   Signed by:   Willy Eddy, LPN on 16/09/9603   Method used:   Electronically to        [facility]* (retail)             ,          Ph: [phone]       Fax: [phone]   RxID:   [id number] CELEBREX 200 MG CAPS (CELECOXIB) one by mouth daily  #90 x 3   Entered by:   Willy Eddy, LPN   Authorized by:   Stacie Glaze MD   Signed by:   Willy Eddy, LPN on 32/44/0102   Method used:   Electronically to        [facility]* (retail)             ,          Ph: [phone]       Fax: [phone]   RxID:   [id number] PROSCAR 5 MG  TABS (FINASTERIDE) once daily  #90 x 3   Entered by:   Willy Eddy, LPN   Authorized by:   Stacie Glaze MD   Signed by:   Willy Eddy, LPN on 06/28/1600   Method used:   Electronically to        [facility]* (retail)             ,          Ph: [phone]       Fax: [phone]   RxID:   [id number]

## 2011-01-29 NOTE — Progress Notes (Signed)
Summary: [name]  Phone Note Call from Patient Call back at Home Phone [phone]   Caller: vm Call For: bonnye Reason for Call: Acute Illness Complaint: Breathing Problems, Urinary/GYN Problems Summary of Call: Leonard samples working great.  Request another [name].  Appt not available.  About 10 left.  Please call me.    Initial call taken by: Rudy Jew, RN,  October 22, 2010 9:11 AM  Follow-up for Phone Call        none available today pt informed Follow-up by: Willy Eddy, LPN,  October 22, 2010 1:44 PM  Additional Follow-up for Phone Call Additional follow up Details #1::        refilled thru [facility] on 10-25 Additional Follow-up by: Willy Eddy, LPN,  October 30, 2010 3:32 PM

## 2011-02-04 ENCOUNTER — Ambulatory Visit (INDEPENDENT_AMBULATORY_CARE_PROVIDER_SITE_OTHER): Payer: Medicare Other | Admitting: Internal Medicine

## 2011-02-04 ENCOUNTER — Encounter: Payer: Self-pay | Admitting: Internal Medicine

## 2011-02-04 VITALS — BP 122/80 | HR 76 | Temp 98.1°F | Resp 14 | Ht 67.0 in | Wt 154.0 lb

## 2011-02-04 DIAGNOSIS — T887XXA Unspecified adverse effect of drug or medicament, initial encounter: Secondary | ICD-10-CM

## 2011-02-04 DIAGNOSIS — R972 Elevated prostate specific antigen [PSA]: Secondary | ICD-10-CM

## 2011-02-04 DIAGNOSIS — E785 Hyperlipidemia, unspecified: Secondary | ICD-10-CM

## 2011-02-04 DIAGNOSIS — R42 Dizziness and giddiness: Secondary | ICD-10-CM

## 2011-02-04 DIAGNOSIS — T50905A Adverse effect of unspecified drugs, medicaments and biological substances, initial encounter: Secondary | ICD-10-CM

## 2011-02-04 MED ORDER — ROSUVASTATIN CALCIUM 20 MG PO TABS: 20.0000 mg | ORAL_TABLET | ORAL | Status: DC

## 2011-02-04 NOTE — Progress Notes (Signed)
  Subjective:    Patient ID: [name], male    DOB: January 30, 1922, 77 y.o.   MRN: [id number]  HPI Patient is an 77 year old white male who presents for followup of chronic medical problems in addition to acute labyrinthitis type symptoms with positional dizziness.  The dizzy this has largely resolved the patient presents today for followup on blood work including cholesterol liver panel basic metabolic panel LDL C. and TSH.  Of note is that the PSA rise appears to have stabilized the last PSA 8 months ago was 12.36 and this PSA is 11.88 this indicates a gradual rise in the PSA may or may not be neoplastic in etiology but because of the slowness of the rise it would be appropriate to continue watchful waiting and no urology intervention is indicated.  The patient expressed understanding of this plan.  The patient's liver functions are normal he is glomerular duration is normal for age his cholesterol however is elevated at 225 with an elevated LDL C. of 170.9 we'll discuss treatment of hyperlipidemia and go over his options at this visit.     Review of Systems  Constitutional: Negative for fever and fatigue.  HENT: Negative for hearing loss, congestion, neck pain and postnasal drip.   Eyes: Negative for discharge, redness and visual disturbance.  Respiratory: Negative for cough, shortness of breath and wheezing.   Cardiovascular: Negative for leg swelling.  Gastrointestinal: Negative for abdominal pain, constipation and abdominal distention.  Genitourinary: Negative for urgency and frequency.  Musculoskeletal: Negative for joint swelling and arthralgias.  Skin: Negative for color change and rash.  Neurological: Positive for dizziness. Negative for weakness and light-headedness.  Hematological: Negative for adenopathy.  Psychiatric/Behavioral: Negative for behavioral problems.       Objective:   Physical Exam    vital signs were reviewed his blood pressure was 122/80 his pulse  was 76 his temperature was 98.1 respiratory rate 17 he is in no apparent distress HEENT reveals pupils are equal round reactive to light and accommodation his neck appears supple his lung field are clear to auscultation and percussion heart examination reveals sinus bradycardia with a regular rate and rhythm there are no carotid bruits. His abdomen is soft nontender no masses appreciated extremity examination reveals the edema is appropriate to person place and time and interaction showing no cognitive deficit.     Assessment & Plan:  One. Elevated PSA the patient has a history of elevated PSAs has seen the urologist in the past his velocity is negative at this time with the last PSA being 12.36 and his current PSA being 11.88 we'll continue monitoring this on a six-month basis his liver functions are normal his creatinine is stable at 1.3 his TSH is stable at 1.94 however his cholesterol is elevated at 225 with an LDL C. of 170.9 which warrants intervention. 2. Hyperlipidemia the patient stopped taking the Lipitor and his cholesterol has risen there is risk of course for heart attack and stroke given his overall risk factors I would recommend that we intervene in some way she was intolerant of the Lipitor we may try pulse therapy with Crestor twice a week. Begin Crestor 20 mg every Monday and Friday samples will be given and monitoring in 3 month. 3. Hypothyroidism he is currently stable on replacement 4 chronic intermittent cough which is requesting when necessary medications.

## 2011-03-05 ENCOUNTER — Telehealth: Payer: Self-pay | Admitting: Internal Medicine

## 2011-03-05 NOTE — Telephone Encounter (Signed)
Pt needs  new rx call to [facility] [phone] celebrex 200mg  #90 with 3 refills

## 2011-03-06 MED ORDER — CELECOXIB 200 MG PO CAPS: 200.0000 mg | ORAL_CAPSULE | Freq: Every day | ORAL | Status: DC

## 2011-03-06 NOTE — Telephone Encounter (Signed)
Sent in electronically .  

## 2011-04-30 ENCOUNTER — Other Ambulatory Visit (INDEPENDENT_AMBULATORY_CARE_PROVIDER_SITE_OTHER): Payer: Medicare Other | Admitting: Internal Medicine

## 2011-04-30 DIAGNOSIS — E785 Hyperlipidemia, unspecified: Secondary | ICD-10-CM

## 2011-04-30 DIAGNOSIS — T50905A Adverse effect of unspecified drugs, medicaments and biological substances, initial encounter: Secondary | ICD-10-CM

## 2011-04-30 DIAGNOSIS — T887XXA Unspecified adverse effect of drug or medicament, initial encounter: Secondary | ICD-10-CM

## 2011-04-30 LAB — HEPATIC FUNCTION PANEL
AST: 24 U/L (ref 0–37)
Alkaline Phosphatase: 65 U/L (ref 39–117)
Total Bilirubin: 0.8 mg/dL (ref 0.3–1.2)

## 2011-05-01 LAB — LDL CHOLESTEROL, DIRECT: Direct LDL: 97.3 mg/dL

## 2011-05-07 ENCOUNTER — Ambulatory Visit (INDEPENDENT_AMBULATORY_CARE_PROVIDER_SITE_OTHER): Payer: Medicare Other | Admitting: Internal Medicine

## 2011-05-07 ENCOUNTER — Encounter: Payer: Self-pay | Admitting: Internal Medicine

## 2011-05-07 VITALS — BP 144/72 | HR 72 | Temp 98.2°F | Resp 16 | Ht 62.0 in | Wt 152.0 lb

## 2011-05-07 DIAGNOSIS — R42 Dizziness and giddiness: Secondary | ICD-10-CM

## 2011-05-07 DIAGNOSIS — K219 Gastro-esophageal reflux disease without esophagitis: Secondary | ICD-10-CM

## 2011-05-07 DIAGNOSIS — E785 Hyperlipidemia, unspecified: Secondary | ICD-10-CM

## 2011-05-07 NOTE — Progress Notes (Signed)
Subjective:    Patient ID: [name], male    DOB: 09-28-1922, 77 y.o.   MRN: [id number]  HPI patient is followed for hyperlipidemia with a cholesterol and a liver function for monitoring purposes. Current medications Crestor 20 mg on Monday and Friday evenings.  Prior to this intervention his LDL cholesterol was 170 after this intervention his LDL or bad cholesterol was 97 liver functions are the same.  He has no muscle skeletal side effects from the Crestor.  He is having an increase in his dizziness. Attacks have increased to every couple of days. The episodes the episodes last for approximately one day or less.  They come on without warning he does take a meclizine which seems to help these episodes he feels wiped out, fatigue during and after an episode. He is not vomiting with these episodes if he gets the meclizine. The episodes may be related to reactive hypoglycemia      Review of Systems  Constitutional: Negative for fever and fatigue.  HENT: Negative for hearing loss, congestion, neck pain and postnasal drip.   Eyes: Negative for discharge, redness and visual disturbance.  Respiratory: Negative for cough, shortness of breath and wheezing.   Cardiovascular: Negative for leg swelling.  Gastrointestinal: Negative for abdominal pain, constipation and abdominal distention.  Genitourinary: Negative for urgency and frequency.  Musculoskeletal: Negative for joint swelling and arthralgias.  Skin: Negative for color change and rash.  Neurological: Positive for dizziness, weakness and light-headedness.  Hematological: Negative for adenopathy.  Psychiatric/Behavioral: Negative for behavioral problems.   Past Medical History  Diagnosis Date  . OTH MALIG NEOPLASM SKIN OTH\T\UNSPEC PARTS FACE 02/10/2009  . HYPERLIPIDEMIA 08/19/2007  . 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 GERD 08/19/2007  . BENIGN PROSTATIC HYPERTROPHY 08/19/2007  . ACTINIC KERATOSIS, EAR, LEFT  06/13/2008  . LOW BACK PAIN 08/19/2007  . SYNCOPE 09/10/2010  . DIZZINESS, CHRONIC 10/02/2010  . DYSPNEA ON EXERTION 09/10/2010  . PROSTATE SPECIFIC ANTIGEN, ELEVATED, CHRONIC 06/15/2010   Past Surgical History  Procedure Date  . Total knee arthroplasty 2007    left knee  . Transurethral resection of prostate     reports that he has never smoked. He has never used smokeless tobacco. He reports that he does not drink alcohol or use illicit drugs. family history includes Arthritis in his mother; Cancer in his father; and Prostate cancer in his father. No Known Allergies     Objective:   Physical Exam  Constitutional: He is oriented to person, place, and time. He appears well-developed and well-nourished.  HENT:  Head: Normocephalic and atraumatic.  Eyes: Conjunctivae are normal. Pupils are equal, round, and reactive to light.  Neck: Normal range of motion. Neck supple.  Cardiovascular: Normal rate and regular rhythm.   Pulmonary/Chest: Effort normal and breath sounds normal.  Abdominal: Soft. Bowel sounds are normal.  Musculoskeletal: He exhibits no edema and no tenderness.  Neurological: He is alert and oriented to person, place, and time. He displays normal reflexes. No cranial nerve deficit.  Skin: Skin is warm and dry.          Assessment & Plan:  These episodes of dizziness may be multifactorial he does relate them partially to stress they may be related to food I do not believe they're related to medications although he does have at least 2 medications which have dizziness as a side effect but these been on these chronically. The episodic nature of these could be related to stress and anxiety over  coming into town with a combination of hypoglycemia.  We do recommend that he bring with him some sort of a protein bar when he is traveling when he experienced these these episodes he should eat something with the Antivert when he takes it and see if this makes these episodes go away  more quickly. Blood pressure is very stable today and cholesterol is excellently controlled. He had a workup of his carotids and his CNS vasculature with an MRI which did not show any occlusive disease.

## 2011-05-15 ENCOUNTER — Telehealth: Payer: Self-pay | Admitting: *Deleted

## 2011-05-15 DIAGNOSIS — R42 Dizziness and giddiness: Secondary | ICD-10-CM

## 2011-05-15 NOTE — Telephone Encounter (Signed)
OK PER DR Lovell Sheehan

## 2011-05-15 NOTE — Telephone Encounter (Signed)
Vertigo is getting worse.  Would like a referral to vestibular PT.

## 2011-05-15 NOTE — Telephone Encounter (Signed)
SENT REFERRAL TO [facility]

## 2011-05-17 NOTE — Discharge Summary (Signed)
NAME:  [name], [name] NO.:  [id number]   MEDICAL RECORD NO.:  [id number]          PATIENT TYPE:  INP   LOCATION:  5148                         FACILITY:  [facility]   PHYSICIAN:  Payton Doughty, M.D.      DATE OF BIRTH:  Feb 08, 1922   DATE OF ADMISSION:  05/12/2007  DATE OF DISCHARGE:  05/15/2007                               DISCHARGE SUMMARY   ADMISSION DIAGNOSIS:  Spondylosis L2-3 and L3-4.   DISCHARGE DIAGNOSIS:  Spondylosis L2-3 and L3-4.   PROCEDURE:  L2-3, L3-4 laminotomy foraminotomy done bilaterally.   COMPLICATIONS:  None.   CONDITION ON DISCHARGE:  Well.   HISTORY OF PRESENT ILLNESS:  This is an 77 year old gentleman, whose  history and physical is located on the chart.  He has got claudicatory  complaints and spinal stenosis on an MR.   PAST MEDICAL HISTORY:  Benign.   ALLERGIES:  NONE.   PHYSICAL EXAMINATION:  GENERAL:  Intact.  NEUROLOGIC EXAM:  Intact with claudicatory complaints.  He was admitted  after ascertaining normal laboratory values and underwent a L2-3, L3-4  laminotomy foraminotomy done bilaterally.  Postoperatively he has done  well.  He did not require a Foley.  Legs feel much better.  He is able  to be up and about without difficulty.  His incisions are well-healing.  He had several bouts of nausea which spontaneously resolved.  He is now  being discharged home in the care of his family with full strength,  eating, and voiding normally.  He is to followup with me in the [facility] in about 10 days for sutures.           ______________________________  Payton Doughty, M.D.     MWR/MEDQ  D:  05/15/2007  T:  05/15/2007  Job:  [id number]

## 2011-05-17 NOTE — Group Therapy Note (Signed)
REASON FOR CONSULTATION:  Left knee pain.   HISTORY OF PRESENT ILLNESS:  An 77 year old, semi-retired Public relations account executive  professor who underwent left total knee replacement February 11, 2005,  complains of postoperative left knee pain.  He states that his pain has  actually been improving with time and went down from 12 Vicodin per down to  2 Vicodin per day.  He is doing well with his physical therapy, and is going  twice a week at the outpatient PT Clinic associated with [facility].  He is quite pleased at his progress with that.  His main  concern is pain that his around 4-5/10 in his left lower extremity.  He  states that it interferes with sleep such that his sleep is fair at that  time.  For this reason, he is looking at other pain management options.  He  uses a cane.  He is able to climb steps.  He is able to drive.  He can walk  45 minutes at a time.   PAST MEDICAL HISTORY:  1.  Hypertension.  2.  Benign prostatic hypertrophy.  3.  Hyperlipidemia.   MEDICATIONS:  1.  Hydrochlorothiazide.  2.  Lipitor.  3.  Proscar.  4.  Wellbutrin.  5.  Vicodin b.i.d.   SOCIAL HISTORY:  He is married and semi-retired.   PHYSICAL EXAMINATION:  VITAL SIGNS:  Blood pressure 145/67, pulse 83,  respirations 17, O2 saturation 97% on room air.  GENERAL APPEARANCE:  No acute distress.  Mood and affect appropriate.  EXTREMITIES:  His left knee can be extended fully and his flexion is greater  than 93.  He has a mild effusion of the left knee, and only minimal  quadriceps atrophy.  He has normal sensation in the lower extremities with  exception just around the knee incision, he had some reduction to sensation  to light touch.   There is no erythema and no significant tenderness with palpation of his  left knee.   IMPRESSION:  Left knee OA status post total knee replacement with  postoperative pain.  He is looking for treatment options other than narcotic  analgesics, and for that  reason, we will have a trial of acupuncture.  I did  indicate to patient that usually need about six visits, either once a week  times six weeks or twice a week for three weeks to fully assess the  efficacy.  He would like to proceed with this.  He has been instructed that  this procedure is not covered by Medicare, although, initial consultation  is.   ADDENDUM:  Acupuncture treatment, bilateral KI3, bilateral SP6, bilateral  BL60, as well as, left ST9 were utilized.  Electrical stimulation between  KI3 and SP6 bilaterally at 4 Hertz x20 minutes.  The patient tolerated the  procedure well.  Return next week for two visits.      Erick Colace, M.D.  Electronically Signed     AEK/MedQ  D:  03/20/2006 17:06:45  T:  03/21/2006 11:12:39  Job #:  [id number]   cc:   Madlyn Frankel Charlann Boxer, M.D.  Fax: [phone]

## 2011-05-17 NOTE — Op Note (Signed)
NAME:  [name], BENYO NO.:  [id number]   MEDICAL RECORD NO.:  [id number]          PATIENT TYPE:  INP   LOCATION:  5148                         FACILITY:  [facility]   PHYSICIAN:  Payton Doughty, M.D.      DATE OF BIRTH:  02/03/1922   DATE OF PROCEDURE:  05/12/2007  DATE OF DISCHARGE:                               OPERATIVE REPORT   PREOPERATIVE DIAGNOSES:  Spondylosis with spinal stenosis at L2-3 and L3-  4.   POSTOPERATIVE DIAGNOSES:  Spondylosis with spinal stenosis at L2-3 and  L3-4.   OPERATIVE PROCEDURES:  Laminotomy and foraminotomy done bilaterally.   SURGEON:  Payton Doughty, M.D.   SERVICE:  Neurosurgery.   ANESTHESIA:  General endotracheal.  __________  with alcohol wipe.   COMPLICATIONS:  None.   ASSISTANT:  [place].   BODY OF TEXT:  This is an 77 year old gentleman with neurogenic  claudication and significant spinal stenosis.  He is taken to the  operating room __________  intubated; placed prone on the operating  table.  Following shave, prep and drape in the usual sterile fashion,  the skin was infiltrated with 1% lidocaine with 1:400,000 epinephrine.  Skin was incised from the top of L2 to the top of L4, and the lamina of  L2, L3 and the top of L4 were exposed bilaterally in a subperiosteal  plane.  Intraoperative x-ray confirmed correctness of the level; having  been confirmed, correctness of hemi semilaminectomy was carried out with  a high-speed drill bilaterally.  at each level.  L3-4 had much tighter  stenosis.  Undercutting of the spinous process was accomplished, as well  as a bilateral lateral recess decompression and removal of ligamentum  flavum.  Following complete decompression at each level, the  neuroforamen were carefully explored and found to be open.  Central  canal was also decompressed.  Wound was irrigated.  Hemostasis assured.  Depo-Medrol was placed in the laminotomy defects.  Successive layers of  0 Vicryl, 2-0 Vicryl and  3-0 nylon were used to close.  Betadine and  Telfa dressing was applied.  The patient returned to the recovery room  in good condition.           ______________________________  Payton Doughty, M.D.     MWR/MEDQ  D:  05/12/2007  T:  05/13/2007  Job:  [id number]

## 2011-05-17 NOTE — Op Note (Signed)
[place]  Patient:    [name]                    MRN: [id number] Proc. Date: 07/29/00 Adm. Date:  [id number] Disc. Date: [id number] Attending:  Evlyn Clines CC:         Excell Seltzer. Annabell Howells, M.D.             Stacie Glaze, M.D. [facility]                           Operative Report  PROCEDURE:  Transurethral resection of the prostate.  PREOPERATIVE DIAGNOSIS:  Benign prostatic hypertrophy with outlet obstruction.  POSTOPERATIVE DIAGNOSIS:  Benign prostatic hypertrophy with outlet obstruction.  SURGEON:  Dr. Bjorn Pippin.  ANESTHESIA:  General.  SPECIMEN:  Prostate tissue.  DRAIN:  22 French 3 way Foley catheter.  COMPLICATIONS:  None.  INDICATIONS:  [name] is a 77 year old white male with a history of benign prostatic hypertrophy and progressive symptoms on Hytrin.  Office evaluation revealed an obstructing prostate gland with some nodularity at the anterior bladder neck.  With this nodularity he was felt not to be a good candidate for microwave or tumor therapy.  It was felt that TURP was indicated.  FINDINGS AT PROCEDURE:  The patient was taken to the operating room where a general anesthetic was induced.  He was given p.o. levaquin preoperatively. He was placed in a lithotomy position.  His peritoneum and genitalia were prepped with Betadine solution.  He was draped in the usual sterile fashion.  His urethra was dilated to 30 [place] with [employer] sounds and a 28 french continuous flow resectoscope sheath was inserted without difficulty.  This was fitted with an Wandra Scot handle with a 12 degree lens and 24 loop.  The cutting was set on blend 1.  Examination revealed the ureteral orifices away from the bladder neck.  There was moderate trabeculation in the bladder prior to office cystoscopy that revealed no bladder lesions.  The prostatic urethra was approximately  3 cm in length with bilobar hyperplasia with obstruction.   There is a nodular BPH at the anterior bladder neck that contributed to the obstruction.  Resection was initiated at the bladder neck.  The fibers were exposed from 5 to 7 oclock.  The floor of the prostate was then resected out to and along side the verumontanum.  The right lobe of the prostate was resected from bladder neck to apex out to the capsular fibers.  This was then repeated on the left.  Some residual apical and anterior tissue was resected, the nodularity at the anterior bladder neck was resected as well.  Once the prostate had been thoroughly resected the chips were evacuated and hemostasis was achieved.  Final inspection revealed no active bleeding, no retained chips.  The ureteral orifices were intact as was the external sphincter.  The bladder was left full, the scope was removed.  Pressure on the bladder produced a good stream.  A 22 French 3 way Foley catheter was inserted with the aid of a catheter guide.  This was connected to continuous irrigation and hand irrigated until clear and connected to straight drainage.  At this point the patients anesthetic was reversed and he was taken down from lithotomy position and went to the recovery room in stable condition.  There were no complications. DD:  07/29/00 TD:  07/30/00 Job: [id number]

## 2011-05-17 NOTE — Discharge Summary (Signed)
NAME:  LEMMIE, VANLANEN            ACCOUNT NO.:  [id number]   MEDICAL RECORD NO.:  [id number]          PATIENT TYPE:  INP   LOCATION:  1515                         [facility]ACILITY:  [facility]   PHYSICIAN:  Madlyn Frankel. Charlann Boxer, M.D.  DATE OF BIRTH:  Feb 03, 1922   DATE OF ADMISSION:  02/11/2006  DATE OF DISCHARGE:  02/15/2006                                 DISCHARGE SUMMARY   ADMISSION DIAGNOSES:  1.  Severe osteoarthritis, end-stage, left knee.  2.  Hypertension.  3.  Benign prostatic hypertrophy.   DISCHARGE DIAGNOSES:  1.  Severe osteoarthritis, end-stage, left knee.  2.  Hypertension.  3.  Benign prostatic hypertrophy.  4.  Mild postoperative anemia.   OPERATION:  On February 11, 2006, the patient underwent a left total knee  replacement arthroplasty with all three components cemented.  Jenne Campus, PA-C, assisted.   BRIEF HISTORY:  This 77 year old gentleman was seen by [place] for left knee pain  which had been going on for at least 20 years.  He had tricompartmental  osteophytes with bone-on-bone articulation.  Conservative care at an outside  facility did not provide him with relief and after much discussion,  including the risks and benefits of surgery, he decided to go ahead with the  above procedure.   HOSPITAL COURSE:  The patient tolerated the surgical procedure quite well.  Was placed on Coumadin protocol for the prevention of DVT after surgery.  He  had a considerable amount of bloody drainage postoperatively; however, it  was noted it was leaking primarily from the Hemovac portal.  This was DC'd,  and the portal closed over.  No further bleeding was noted.  Mild  hyponatremia at 133 was seen as well.  It was felt the patient might benefit  from rehabilitation program; however, after he began activities, which he  was doing quite well, he was not a candidate for an inpatient program and  could be managed at home.  Our direction was towards that ultimate setting.   The patient  remained awake, alert and oriented.  Left lower extremity  remained stable without evidence of any neurovascular compromise.  He did  have some mild anemia, but it was totally asymptomatic.  The wound remained  dry without any problems.   Laboratory values in the hospital show a urinalysis negative, and blood  chemistries essentially within normal limits.  His hemoglobin was 13.5 on  admission, and hematocrit was 39.  Final hemoglobin 9.0 with a hematocrit of  25.5.  Blood chemistries were essentially normal, and there was very mild  hyponatremia at 133.   No electrocardiogram or chest x-ray seen on his chart.   CONDITION ON DISCHARGE:  Improved, stable.   PLAN:  The patient was discharged to his home to continue with total knee  protocol.  Weightbearing as tolerated.  Continue with Coumadin for four  weeks after the date of surgery under the direction of the pharmacy.  Vicodin is used for discomfort.  Robaxin as a muscle relaxant.  He will have  Trinsicon as an iron supplement for his anemia.  He is to follow up  with Dr.  Lovell Sheehan here in [place] for any medical problems.      Dooley L. Cherlynn June.      Madlyn Frankel Charlann Boxer, M.D.  Electronically Signed    DLU/MEDQ  D:  02/24/2006  T:  02/24/2006  Job:  [id number]   cc:   Stacie Glaze, M.D. [facility]  [address] [place]  [place] [address]

## 2011-05-17 NOTE — Procedures (Signed)
NAME:  [name], [name] NO.:  [id number]   MEDICAL RECORD NO.:  [id number]          PATIENT TYPE:  REC   LOCATION:  TPC                          FACILITY:  [facility]   PHYSICIAN:  Erick Colace, M.D.DATE OF BIRTH:  [birth year]   DATE OF PROCEDURE:  04/08/2006  DATE OF DISCHARGE:                                 OPERATIVE REPORT   MEDICAL RECORD NUMBER:  [id number].   PROCEDURE:  Acupuncture treatment, #2, his last treatment approximately two  weeks ago. He complains of left knee pain status post knee replacement eight  weeks ago. Also complains of depression and anxiety.   Functionally, he is independent. He is able walk 30 minutes at a time but  does not climb steps. 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   His left knee has a well healed incision. No hypersensitivity.   Needles placed superolateral and superomedial aspect of the bilateral  patella and inferior lateral aspect of left patella. Also points placed at  [facility]. In addition, points placed at [facility] and DU24.5. Electrical stimulation  between the above points. Also put an ear tack in the left thalamus area,  auricular point.   Treatment 30 minutes. See him back on a scheduled appointment, next on April 11, 2006.      Erick Colace, M.D.  Electronically Signed     AEK/MEDQ  D:  04/08/2006 13:32:40  T:  04/08/2006 16:19:55  Job:  [id number]

## 2011-05-17 NOTE — H&P (Signed)
NAME:  [name], [name]            ACCOUNT NO.:  [id number]   MEDICAL RECORD NO.:  [id number]          PATIENT TYPE:  INP   LOCATION:  NA                           FACILITY:  [facility]   PHYSICIAN:  Madlyn Frankel. Charlann Boxer, M.D.  DATE OF BIRTH:  03-24-1922   DATE OF ADMISSION:  02/11/2006  DATE OF DISCHARGE:                                HISTORY & PHYSICAL   This history and physical was performed on February 03, 2006 in Dr. Molli Hazard  Olin's office.   CHIEF COMPLAINT:  Pain in my left knee.   HISTORY OF PRESENT ILLNESS:  This 77 year old male seen by [place] for continuing  problems concerning pain into his left knee.  He has had at least a 20 year  history of increasing pain over the knee.  He had a knee arthroscopy in the  past as well as corticosteroid injections and a Synvisc series. He is a very  active gentleman despite his age and having increasing problems concerning  ambulation and going about his day to day activity.  He is trying to stay  active. It is interfering with his walking program and he has pain at night.  Nonsteroidal anti-inflammatories are used on an occasional basis and really  have not helped his comfort.  We offered further conservative measures,  however, he is quite frustrated with his current situation and is highly  desirable for knee replacement.  The risks and benefits of surgery have been  described to the patient, all questions were encouraged and answered.  He  has decided to go ahead with total knee replacement arthroplasty of the left  knee.   PAST MEDICAL HISTORY:  This gentleman has been in relatively good health  throughout his lifetime.  He had a transurethral resection and an  arthroscope to the left in the past.  Dr. Darryll Capers is his medical  physician and has cleared him for surgery.  Dr. Lovell Sheehan treated him for  benign prostatic hypertrophy and hypertension.   CURRENT MEDICATIONS:  1.  Lipitor 20 mg daily.  2.  Hydrochlorothiazide 12.5 mg daily.  3.  Finasteride 5 mg daily.  4.  Terazosin 10 mg daily.  5.  Etodolac 400 mg b.i.d., (has stopped on February 01, 2006).  6.  He takes Colace three times daily.   ALLERGIES:  He has no medical allergies.   SOCIAL HISTORY:  Noncontributory.   FAMILY HISTORY:  Noncontributory.   REVIEW OF SYSTEMS:  CNS:  No seizure disorder, paralysis, numbness, double  vision. RESPIRATORY:  No productive cough, no hemoptysis, no shortness of  breath. CARDIOVASCULAR:  No chest pain, no angina, no orthopnea.  GASTROINTESTINAL:  Has occasional constipation.  Does well with the Colace.  No melena or bloody stools. GENITOURINARY:  No discharge, dysuria or  hematuria.  MUSCULOSKELETAL:  Primarily in history of present illness.   PHYSICAL EXAMINATION:  GENERAL APPEARANCE: Alert and cooperative, friendly,  full alert and oriented X66 77 year old white male who goes by the name of  [name].  VITAL SIGNS: Blood pressure 130/52, pulse 80, respirations 12.  HEENT:  Normocephalic. Pupils equal, round, reactive to light  and  accommodation.  Extraocular movements intact.  Oropharynx is clear.  CHEST:  Clear to auscultation with no rhonchi, rales or wheezes.  HEART:  With regular rate and rhythm, no murmurs are heard.  ABDOMEN:  Soft, nontender. Liver and spleen not felt.  GENITOURINARY:  Genitalia and rectal examination's not done, not pertinent  to present illness.  EXTREMITIES:  Patient has pain with crepitus on range of motion of the left  knee.   ADMISSION DIAGNOSES:  1.  Severe osteoarthritis, end-stage, left knee.  2.  Hypertension.  3.  Benign prostatic hypertrophy.   PLAN:  Patient will undergo left total knee replacement arthroplasty.  In  discussing with the department of anesthesia what kind of anesthesia to use,  nerve block and spinal have been recommended.  He will probably be in the  hospital for about three days and then have [place] has home health.  During  his physical, all questions were  encouraged and answered concerning his  upcoming surgery and perioperative course.  This history and physical was  performed in our office on February 03, 2006.      Dooley L. Cherlynn June.      Madlyn Frankel Charlann Boxer, M.D.  Electronically Signed    DLU/MEDQ  D:  02/04/2006  T:  02/04/2006  Job:  [id number]   cc:   Stacie Glaze, M.D. [facility]  [address] [place]  [place] [address]

## 2011-05-17 NOTE — Op Note (Signed)
NAME:  [name], [name]            ACCOUNT NO.:  [id number]   MEDICAL RECORD NO.:  [id number]          PATIENT TYPE:  INP   LOCATION:  1515                         FACILITY:  [facility]   PHYSICIAN:  Madlyn Frankel. Charlann Boxer, M.D.  DATE OF BIRTH:  02-Dec-1922   DATE OF PROCEDURE:  02/11/2006  DATE OF DISCHARGE:                                 OPERATIVE REPORT   PREOPERATIVE DIAGNOSIS:  End-stage left knee osteoarthritis.   POSTOPERATIVE DIAGNOSIS:  End-stage left knee osteoarthritis.   PROCEDURE:  Left total knee replacement.   COMPONENTS USED:  DePuy rotating platform posterior stabilized knee system  with size 3 femur, size 3 tibia, 12.5 mm posterior stabilized insert and a  38 patella button.   SURGEON:  Madlyn Frankel. Charlann Boxer, M.D.   ASSISTANTDruscilla Brownie. Cherlynn June.   ANESTHESIA:  Spinal plus MAC.   ESTIMATED BLOOD LOSS:  Minimal.   TOURNIQUET TIME:  50 minutes at 250 mmHg.   COMPLICATIONS:  None.   DRAINS:  Drains x1.   INDICATIONS FOR PROCEDURE:  [name] is a pleasant 77 year old gentleman  who presented to the office with a 20 year history of left knee pain.  Radiographs at that time had indicated bone on bone articulation medially  with tricompartmental osteophytes. After further discussion, he had gone  through conservative care in an outside facility and wished to proceed with  surgical intervention. The risks and benefits were reviewed including the  postoperative recovery including infection, DVT, component dislocation and  failure. Consent was obtained based off these discussions.   DESCRIPTION OF PROCEDURE:  The patient was brought to the operative theater.  Once adequate anesthesia and preoperative antibiotics, 1 gram of Ancef, were  administered, the patient was positioned supine on the operating table. A  proximal thigh tourniquet was placed. The left lower extremity was then  prepped and draped in a sterile fashion following a prescrub.   A midline incision was  made followed by a modified medial parapatellar  (trivector) approach to the knee. Knee exposure was obtained in routine  fashion without eversion of the patella. Osteophytes were debrided to allow  for decompression of the joint. Intramedullary guide was passed and 10 mm of  bone was resected off the distal femur at 5 degrees valgus. I then incised  the femur to be a size 3. The size 3 cutting block was placed. Anterior,  posterior and chamfer cuts were then made. Final box preparation was carried  out placing the femur in the lateral aspect of the distal femur.   At this point, attention was directed to the tibia. Tibial exposure was  obtained in routine fashion. The patient was noted to have significant  ebronated bone in the osteophytes medially. 10 mm of bone was dissected off  the lateral tibia, this amounted to approximately 4 mm of bone medially.  This cut was made and debridement of osteophytes was carried out medially as  well as at this point off of the posterior aspect of the femur both medial  and laterally. Trial reduction was then carried out at this point with a  size 3  femur and initially a size 10 mm posterior stabilized insert. The  balance appeared to be adequate but I put in a 12.5 and the knee came to  full extension and was more stable all the way through to flexion. At this  point, tibial rotation was marked and attnention was directed to the  patella. The patella precut measurement was approximately 24 mm and I  resected down to 14 mm. A 38 patella button was then positioned and drill  holes placed. The patella tracked without any application of pressure. At  this point, the final components were brought onto the field, final tibial  preparation was carried out with the rotation marked with a fixed bearing  prosthesis trial. This included drilling and keel punch preparation for the  rotating platform knee system. Final components were brought into the field  and  cement was mixed. In this interval, I did inject the knee with a  combination of 60 mL of Marcaine with epinephrine and Toradol. I then  irrigated the knee copiously. Once the cement was ready, the components were  cemented in position with the tibia, femur and patella. A 12 mm spacer was  placed in the knee, the knee brought into extension and warm saline was  placed in the knee. The tourniquet was let down at 50 minutes. Excessive  cement was then removed. Once the cement had cured, excessive cement was  debrided. The final 12.5 poly was then positioned following reexamination of  the knee stability and extension through flexion.   At this point, a medium hemovac drain was placed deep, the extensor  mechanism was reapproximated using #1 PDS. The remainder of the wound was  closed in layers with a 4-0 Monocryl. The patient tolerated the procedure  well without complications and was awoken and brought into the recovery room  in stable condition.      Madlyn Frankel Charlann Boxer, M.D.  Electronically Signed     MDO/MEDQ  D:  02/11/2006  T:  02/11/2006  Job:  [id number]

## 2011-05-17 NOTE — H&P (Signed)
[name], [name] NO.:  [id number]   MEDICAL RECORD NO.:  [id number]          PATIENT TYPE:  INP   LOCATION:  2899                         FACILITY:  [facility]   PHYSICIAN:  Payton Doughty, M.D.      DATE OF BIRTH:  [birth year]   DATE OF ADMISSION:  05/12/2007  DATE OF DISCHARGE:                              HISTORY & PHYSICAL   ADMITTING DIAGNOSES:  Spondylosis L2-3, L3-4.   This is a very nice 77 year old right-handed white gentleman, pain and  spasm his buttocks, down his leg and got worse in August 2007, had some  intermittent back pain.  After August, has been able to go to  integrative therapy.  He has got a  Medrol Dosepak, helped a little bit.  Epidural is helpful a little bit.  Visited several places, sought me as  an opinion.  He visited with minimal invasive spine surgeon, was not  very tickled with their ideas and is now admitted for laminotomy,  foraminotomy bilaterally.   MEDICAL HISTORY:  Is benign.  He uses Hytrin, Proscar, Celebrex,  Wellbutrin, hydrochlorothiazide and Lipitor.   ALLERGIES:  None.   SURGICAL HISTORY:  Is a TURP in 1980 and a knee replacement in 2007.   SOCIAL HISTORY:  Does not smoke or drink.  He is a retired Art gallery manager.   FAMILY HISTORY:  Father died of prostate cancer.  Mom died of colon  cancer.   REVIEW OF SYSTEMS:  Remarkable for ulcers in 1952, difficulty in  starting his stream and prostate cancer in the past.  HEENT: Exam normal limits.  He has reasonable range of motion in his  neck.  CHEST:  Clear.  CARDIAC:  Regular rate and rhythm.  ABDOMEN:  Nontender, no hepatosplenomegaly.  EXTREMITIES:  Without clubbing or  cyanosis.  GU:  Exam is deferred.  PULSE:  Peripheral pulses are good.  NEUROLOGICAL:  He is awake, alert and oriented.  His cranial nerves are  intact.  MOTOR: Exam shows 5/5 strength throughout the upper and lower  extremities.  SENSORY: __________ described and non-dermatomal.  Reflexes are two at the  knees, two at the ankles.  Toes downgoing  bilaterally.  Worst position is sitting.  He  has not tried bending  forward to seek relief.   __________ MR that shows severe spinal stenosis at L3-4 and significant  stenosis at 2-3.   CLINICAL IMPRESSION:  Neurogenic claudication secondary to lumbar  spondylosis.   PLAN:  Is for bilateral laminotomy and foraminotomy, L2-3 and L3-4.  The  risks and benefits has been discussed with him.  He wishes to proceed.          ______________________________  Payton Doughty, M.D.    MWR/MEDQ  D:  05/12/2007  T:  05/12/2007  Job:  [id number]

## 2011-06-03 ENCOUNTER — Other Ambulatory Visit: Payer: Self-pay | Admitting: *Deleted

## 2011-06-03 DIAGNOSIS — E785 Hyperlipidemia, unspecified: Secondary | ICD-10-CM

## 2011-06-03 MED ORDER — ROSUVASTATIN CALCIUM 20 MG PO TABS: 20.0000 mg | ORAL_TABLET | ORAL | Status: DC

## 2011-06-10 ENCOUNTER — Ambulatory Visit: Payer: Medicare Other | Attending: Internal Medicine | Admitting: Rehabilitative and Restorative Service Providers"

## 2011-06-10 DIAGNOSIS — R269 Unspecified abnormalities of gait and mobility: Secondary | ICD-10-CM | POA: Insufficient documentation

## 2011-06-10 DIAGNOSIS — R42 Dizziness and giddiness: Secondary | ICD-10-CM | POA: Insufficient documentation

## 2011-06-10 DIAGNOSIS — IMO0001 Reserved for inherently not codable concepts without codable children: Secondary | ICD-10-CM | POA: Insufficient documentation

## 2011-06-25 ENCOUNTER — Ambulatory Visit: Payer: Medicare Other | Admitting: Rehabilitative and Restorative Service Providers"

## 2011-07-05 ENCOUNTER — Ambulatory Visit: Payer: Medicare Other | Admitting: Rehabilitative and Restorative Service Providers"

## 2011-07-09 ENCOUNTER — Ambulatory Visit: Payer: Medicare Other | Attending: Internal Medicine | Admitting: Rehabilitative and Restorative Service Providers"

## 2011-07-09 DIAGNOSIS — IMO0001 Reserved for inherently not codable concepts without codable children: Secondary | ICD-10-CM | POA: Insufficient documentation

## 2011-07-09 DIAGNOSIS — R42 Dizziness and giddiness: Secondary | ICD-10-CM | POA: Insufficient documentation

## 2011-07-09 DIAGNOSIS — R269 Unspecified abnormalities of gait and mobility: Secondary | ICD-10-CM | POA: Insufficient documentation

## 2011-07-26 ENCOUNTER — Other Ambulatory Visit: Payer: Self-pay | Admitting: *Deleted

## 2011-07-26 MED ORDER — DUTASTERIDE-TAMSULOSIN HCL 0.5-0.4 MG PO CAPS: ORAL_CAPSULE | ORAL | Status: DC

## 2011-07-29 ENCOUNTER — Telehealth: Payer: Self-pay | Admitting: *Deleted

## 2011-07-30 NOTE — Telephone Encounter (Signed)
Opened encounter in error  

## 2011-08-12 ENCOUNTER — Other Ambulatory Visit (INDEPENDENT_AMBULATORY_CARE_PROVIDER_SITE_OTHER): Payer: Medicare Other

## 2011-08-12 DIAGNOSIS — E785 Hyperlipidemia, unspecified: Secondary | ICD-10-CM

## 2011-08-12 DIAGNOSIS — T887XXA Unspecified adverse effect of drug or medicament, initial encounter: Secondary | ICD-10-CM

## 2011-08-12 LAB — HEPATIC FUNCTION PANEL
AST: 22 U/L (ref 0–37)
Total Bilirubin: 0.9 mg/dL (ref 0.3–1.2)

## 2011-08-12 LAB — LIPID PANEL
HDL: 48.9 mg/dL (ref >39.00)
Total CHOL/HDL Ratio: 4

## 2011-08-12 LAB — BASIC METABOLIC PANEL
CO2: 27 mEq/L (ref 19–32)
Calcium: 9.3 mg/dL (ref 8.4–10.5)
Creatinine, Ser: 1.1 mg/dL (ref 0.4–1.5)
Glucose, Bld: 98 mg/dL (ref 70–99)

## 2011-08-14 ENCOUNTER — Encounter: Payer: Self-pay | Admitting: Internal Medicine

## 2011-08-14 ENCOUNTER — Ambulatory Visit (INDEPENDENT_AMBULATORY_CARE_PROVIDER_SITE_OTHER): Payer: Medicare Other | Admitting: Internal Medicine

## 2011-08-14 DIAGNOSIS — R42 Dizziness and giddiness: Secondary | ICD-10-CM

## 2011-08-14 DIAGNOSIS — M545 Low back pain, unspecified: Secondary | ICD-10-CM

## 2011-08-14 DIAGNOSIS — N400 Enlarged prostate without lower urinary tract symptoms: Secondary | ICD-10-CM

## 2011-08-14 DIAGNOSIS — E785 Hyperlipidemia, unspecified: Secondary | ICD-10-CM

## 2011-08-14 MED ORDER — CARISOPRODOL 350 MG PO TABS: 350.0000 mg | ORAL_TABLET | Freq: Four times a day (QID) | ORAL | Status: DC | PRN

## 2011-08-14 MED ORDER — ROSUVASTATIN CALCIUM 20 MG PO TABS: 20.0000 mg | ORAL_TABLET | ORAL | Status: DC

## 2011-08-14 NOTE — Assessment & Plan Note (Signed)
Controlled with physical therapy intervention

## 2011-08-14 NOTE — Progress Notes (Signed)
  Subjective:    Patient ID: [name], male    DOB: [birth year], 77 y.o.   MRN: [id number]  HPI  Follow up of lipids and liver labs Follow up dizziness after the PT consult. Much improved reviewed back pain  Stable with soma at night Mood stable crestor tolerated   Review of Systems  Constitutional: Negative for fever and fatigue.  HENT: Negative for hearing loss, congestion, neck pain and postnasal drip.   Eyes: Negative for discharge, redness and visual disturbance.  Respiratory: Negative for cough, shortness of breath and wheezing.   Cardiovascular: Negative for leg swelling.  Gastrointestinal: Negative for abdominal pain, constipation and abdominal distention.  Genitourinary: Negative for urgency and frequency.  Musculoskeletal: Negative for joint swelling and arthralgias.  Skin: Negative for color change and rash.  Neurological: Negative for weakness and light-headedness.  Hematological: Negative for adenopathy.  Psychiatric/Behavioral: Negative for behavioral problems.   Past Medical History  Diagnosis Date  . OTH MALIG NEOPLASM SKIN OTH\T\UNSPEC PARTS FACE 02/10/2009  . HYPERLIPIDEMIA 08/19/2007  . 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 GERD 08/19/2007  . BENIGN PROSTATIC HYPERTROPHY 08/19/2007  . ACTINIC KERATOSIS, EAR, LEFT 06/13/2008  . LOW BACK PAIN 08/19/2007  . SYNCOPE 09/10/2010  . DIZZINESS, CHRONIC 10/02/2010  . DYSPNEA ON EXERTION 09/10/2010  . PROSTATE SPECIFIC ANTIGEN, ELEVATED, CHRONIC 06/15/2010   Past Surgical History  Procedure Date  . Total knee arthroplasty 2007    left knee  . Transurethral resection of prostate     reports that he has never smoked. He has never used smokeless tobacco. He reports that he does not drink alcohol or use illicit drugs. family history includes Arthritis in his mother; Cancer in his father; and Prostate cancer in his father. No Known Allergies      Objective:   Physical Exam  Nursing note and vitals  reviewed. Constitutional: He appears well-developed and well-nourished.  HENT:  Head: Normocephalic and atraumatic.  Eyes: Conjunctivae are normal. Pupils are equal, round, and reactive to light.  Neck: Normal range of motion. Neck supple.  Cardiovascular: Normal rate and regular rhythm.   Pulmonary/Chest: Effort normal and breath sounds normal.  Abdominal: Soft. Bowel sounds are normal.          Assessment & Plan:  The patient is an 77 year old white male who presents for followup of blood work drawn prior to the visit his lipid and LDL direct is at goal on Crestor twice weekly he is tolerating the medication without side effect this is a marked improvement from his prior lipid profile.  The dizziness has improved dramatically with physical therapy he is doing home physical therapy right now and maintaining.  His chronic back pain is stable he takes soma at nighttime and does back stretching exercises

## 2011-08-14 NOTE — Assessment & Plan Note (Signed)
Seeing [name] for elevated PSA

## 2011-08-14 NOTE — Assessment & Plan Note (Signed)
We're on Crestor 20 mg twice weekly with a significant decline in both total cholesterol and LDL direct he is now at goal for his LDL direct

## 2011-08-14 NOTE — Assessment & Plan Note (Signed)
Back pain stable /improved

## 2011-09-09 ENCOUNTER — Other Ambulatory Visit: Payer: Self-pay | Admitting: Internal Medicine

## 2011-09-11 ENCOUNTER — Other Ambulatory Visit: Payer: Self-pay | Admitting: Internal Medicine

## 2011-09-12 ENCOUNTER — Telehealth: Payer: Self-pay | Admitting: *Deleted

## 2011-09-12 NOTE — Telephone Encounter (Signed)
Already sent in by     E-script

## 2011-09-12 NOTE — Telephone Encounter (Signed)
Pt states he needs a refill of Cipro called to [facility] to have on hand for prostatitis.

## 2012-02-11 ENCOUNTER — Encounter: Payer: Self-pay | Admitting: Internal Medicine

## 2012-02-11 ENCOUNTER — Ambulatory Visit (INDEPENDENT_AMBULATORY_CARE_PROVIDER_SITE_OTHER): Payer: Medicare Other | Admitting: Internal Medicine

## 2012-02-11 VITALS — BP 152/78 | HR 72 | Temp 98.1°F | Ht 62.5 in | Wt 154.0 lb

## 2012-02-11 DIAGNOSIS — E785 Hyperlipidemia, unspecified: Secondary | ICD-10-CM

## 2012-02-11 DIAGNOSIS — R0609 Other forms of dyspnea: Secondary | ICD-10-CM

## 2012-02-11 DIAGNOSIS — Z0000 Encounter for general adult medical examination without abnormal findings: Secondary | ICD-10-CM

## 2012-02-11 DIAGNOSIS — I10 Essential (primary) hypertension: Secondary | ICD-10-CM

## 2012-02-11 DIAGNOSIS — T887XXA Unspecified adverse effect of drug or medicament, initial encounter: Secondary | ICD-10-CM

## 2012-02-11 LAB — LIPID PANEL
Cholesterol: 179 mg/dL (ref 0–200)
HDL: 46.4 mg/dL (ref >39.00)
LDL Cholesterol: 115 mg/dL — ABNORMAL HIGH (ref 0–99)
Total CHOL/HDL Ratio: 4
Triglycerides: 90 mg/dL (ref 0.0–149.0)

## 2012-02-11 LAB — HEPATIC FUNCTION PANEL
Bilirubin, Direct: 0.1 mg/dL (ref 0.0–0.3)
Total Bilirubin: 0.9 mg/dL (ref 0.3–1.2)
Total Protein: 6.7 g/dL (ref 6.0–8.3)

## 2012-02-11 LAB — BASIC METABOLIC PANEL
BUN: 22 mg/dL (ref 6–23)
CO2: 29 mEq/L (ref 19–32)
Chloride: 105 mEq/L (ref 96–112)
Creatinine, Ser: 1 mg/dL (ref 0.4–1.5)
Glucose, Bld: 97 mg/dL (ref 70–99)
Potassium: 4.3 mEq/L (ref 3.5–5.1)

## 2012-02-11 LAB — CBC WITH DIFFERENTIAL/PLATELET
Basophils Absolute: 0 10*3/uL (ref 0.0–0.1)
Eosinophils Absolute: 0.1 10*3/uL (ref 0.0–0.7)
Hemoglobin: 14.4 g/dL (ref 13.0–17.0)
Lymphocytes Relative: 32.8 % (ref 12.0–46.0)
Lymphs Abs: 1.8 10*3/uL (ref 0.7–4.0)
MCHC: 33.5 g/dL (ref 30.0–36.0)
MCV: 90.8 fl (ref 78.0–100.0)
Monocytes Absolute: 0.6 10*3/uL (ref 0.1–1.0)
Neutro Abs: 3 10*3/uL (ref 1.4–7.7)
RDW: 13.1 % (ref 11.5–14.6)

## 2012-02-11 NOTE — Patient Instructions (Signed)
The patient is instructed to continue all medications as prescribed. Schedule followup with check out clerk upon leaving the clinic  

## 2012-02-11 NOTE — Progress Notes (Signed)
Subjective:    Patient ID: [name], male    DOB: [birth year], 78 y.o.   MRN: [id number]  HPI  Patient is an 78 year old male who presents for a annual Medicare exam this is his first annual Medicare exam.  He is also followed for hyperlipidemia and we will be monitoring his cholesterol today for gastroesophageal reflux a history of intermittent dizziness which has recently been stable and resolved.  He also has a history of chronic elevation of his PSA which we monitor on a quarterly basis with the urology consult. Current medications are Celebrex and Crestor and he uses soma as needed for muscle relaxant as needed which is rarely The urologist has placed him on jalyn.    Review of Systems  Constitutional: Negative for fever and fatigue.  HENT: Negative for hearing loss, congestion, neck pain and postnasal drip.   Eyes: Negative for discharge, redness and visual disturbance.  Respiratory: Negative for cough, shortness of breath and wheezing.   Cardiovascular: Negative for leg swelling.  Gastrointestinal: Negative for abdominal pain, constipation and abdominal distention.  Genitourinary: Negative for urgency and frequency.  Musculoskeletal: Negative for joint swelling and arthralgias.  Skin: Negative for color change and rash.  Neurological: Negative for weakness and light-headedness.  Hematological: Negative for adenopathy.  Psychiatric/Behavioral: Negative for behavioral problems.   Past Medical History  Diagnosis Date  . OTH MALIG NEOPLASM SKIN OTH\T\UNSPEC PARTS FACE 02/10/2009  . HYPERLIPIDEMIA 08/19/2007  . 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 GERD 08/19/2007  . BENIGN PROSTATIC HYPERTROPHY 08/19/2007  . ACTINIC KERATOSIS, EAR, LEFT 06/13/2008  . LOW BACK PAIN 08/19/2007  . SYNCOPE 09/10/2010  . DIZZINESS, CHRONIC 10/02/2010  . DYSPNEA ON EXERTION 09/10/2010  . PROSTATE SPECIFIC ANTIGEN, ELEVATED, CHRONIC 06/15/2010    History   Social History  . Marital  Status: Married    Spouse Name: N/A    Number of Children: N/A  . Years of Education: N/A   Occupational History  . Not on file.   Social History Main Topics  . Smoking status: Never Smoker   . Smokeless tobacco: Never Used  . Alcohol Use: No  . Drug Use: No  . Sexually Active: Not Currently   Other Topics Concern  . Not on file   Social History Narrative  . No narrative on file    Past Surgical History  Procedure Date  . Total knee arthroplasty 2007    left knee  . Transurethral resection of prostate     Family History  Problem Relation Age of Onset  . Arthritis Mother   . Cancer Father   . Prostate cancer Father     No Known Allergies  Current Outpatient Prescriptions on File Prior to Visit  Medication Sig Dispense Refill  . carisoprodol (SOMA) 350 MG tablet Take 1 tablet (350 mg total) by mouth 4 (four) times daily as needed.  50 tablet  1  . CELEBREX 200 MG capsule TAKE 1 CAPSULE DAILY  90 capsule  3  . rosuvastatin (CRESTOR) 20 MG tablet Take 1 tablet (20 mg total) by mouth as directed. Every Monday night and every Friday night  30 tablet  3    BP 152/78  Pulse 72  Temp(Src) 98.1 F (36.7 C) (Oral)  Ht 5' 2.5" (1.588 m)  Wt 154 lb (69.854 kg)  BMI 27.72 kg/m2       Objective:   Physical Exam  Nursing note and vitals reviewed. Constitutional: He appears well-developed and well-nourished.  HENT:  Head: Normocephalic and atraumatic.  Eyes: Conjunctivae are normal. Pupils are equal, round, and reactive to light.  Neck: Normal range of motion. Neck supple.  Cardiovascular: Normal rate and regular rhythm.   Pulmonary/Chest: Effort normal and breath sounds normal.  Abdominal: Soft. Bowel sounds are normal.          Assessment & Plan:  We will draw a basic metabolic panel today to make sure that his kidney is tolerating the use of the anti-inflammatory.  We will draw liver panel today to monitor the effect of Crestor all of the liver as well as  other medications we'll draw a lipid panel to monitor the effect of Crestor and reaching his prevention goal for hyperlipidemia.  A CBC differential should be drawn for affective drug screening.   Subjective:    [name] is a 78 y.o. male who presents for Medicare Initial preventive examination.   Preventive Screening-Counseling & Management  Tobacco History  Smoking status  . Never Smoker   Smokeless tobacco  . Never Used    Problems Prior to Visit 1.   Current Problems (verified) Patient Active Problem List  Diagnoses  . HYPERLIPIDEMIA  . MIXED HEARING LOSS UNILATERAL  . GERD  . BENIGN PROSTATIC HYPERTROPHY  . ACTINIC KERATOSIS, EAR, LEFT  . LOW BACK PAIN  . DIZZINESS, CHRONIC  . DYSPNEA ON EXERTION  . PROSTATE SPECIFIC ANTIGEN, ELEVATED, CHRONIC  . OTH MALIG NEOPLASM SKIN OTH&UNSPEC PARTS FACE    Medications Prior to Visit Current Outpatient Prescriptions on File Prior to Visit  Medication Sig Dispense Refill  . carisoprodol (SOMA) 350 MG tablet Take 1 tablet (350 mg total) by mouth 4 (four) times daily as needed.  50 tablet  1  . CELEBREX 200 MG capsule TAKE 1 CAPSULE DAILY  90 capsule  3  . rosuvastatin (CRESTOR) 20 MG tablet Take 1 tablet (20 mg total) by mouth as directed. Every Monday night and every Friday night  30 tablet  3    Current Medications (verified) Current Outpatient Prescriptions  Medication Sig Dispense Refill  . carisoprodol (SOMA) 350 MG tablet Take 1 tablet (350 mg total) by mouth 4 (four) times daily as needed.  50 tablet  1  . CELEBREX 200 MG capsule TAKE 1 CAPSULE DAILY  90 capsule  3  . rosuvastatin (CRESTOR) 20 MG tablet Take 1 tablet (20 mg total) by mouth as directed. Every Monday night and every Friday night  30 tablet  3     Allergies (verified) Review of patient's allergies indicates no known allergies.   PAST HISTORY  Family History Family History  Problem Relation Age of Onset  . Arthritis Mother   . Cancer  Father   . Prostate cancer Father     Social History History  Substance Use Topics  . Smoking status: Never Smoker   . Smokeless tobacco: Never Used  . Alcohol Use: No    Are there smokers in your home (other than you)?  No  Risk Factors Current exercise habits: The patient does not participate in regular exercise at present.  Dietary issues discussed: none identified   Cardiac risk factors: advanced age (older than 64 for men, 66 for women).  Depression Screen (Note: if answer to either of the following is "Yes", a more complete depression screening is indicated)   Q1: Over the past two weeks, have you felt down, depressed or hopeless? nlo  Q2: Over the past two weeks, have you felt little interest or pleasure  in doing things? no  Have you lost interest or pleasure in daily life? no  Do you often feel hopeless? no  Do you cry easily over simple problems? no  Activities of Daily Living In your present state of health, do you have any difficulty performing the following activities?:  Driving? No Managing money?  No Feeding yourself? No Getting from bed to chair? No Climbing a flight of stairs? No Preparing food and eating?: No Bathing or showering? No Getting dressed: No Getting to the toilet? No Using the toilet:No Moving around from place to place: No In the past year have you fallen or had a near fall?:No   Are you sexually active?  No  Do you have more than one partner?  No  Hearing Difficulties: No Do you often ask people to speak up or repeat themselves? No Do you experience ringing or noises in your ears? No Do you have difficulty understanding soft or whispered voices? No   Do you feel that you have a problem with memory? Yes  Do you often misplace items? No  Do you feel safe at home?  Yes  Cognitive Testing  Alert? Yes  Normal Appearance?Yes  Oriented to person? Yes  Place? Yes   Time? Yes  Recall of three objects?  Yes  Can perform simple  calculations? Yes  Displays appropriate judgment?Yes  Can read the correct time from a watch face?Yes   Advanced Directives have been discussed with the patient? Yes   List the Names of Other Physician/Practitioners you currently use: 1.    Indicate any recent Medical Services you may have received from other than [facility] providers in the past year (date may be approximate).  Immunization History  Administered Date(s) Administered  . Influenza Whole 12/30/2000, 10/13/2008  . Pneumococcal Polysaccharide 12/30/2000  . Td 12/30/2005  . Zoster 10/13/2008    Screening Tests Health Maintenance  Topic Date Due  . Influenza Vaccine  09/30/2011  . Colonoscopy  07/05/2014  . Tetanus/tdap  12/31/2015  . Pneumococcal Polysaccharide Vaccine Age 7 And Over  Completed  . Zostavax  Completed    All answers were reviewed with the patient and necessary referrals were made:  Carrie Mew, MD   02/11/2012   History reviewed: allergies, current medications, past family history, past medical history, past social history, past surgical history and problem list  Review of Systems A comprehensive review of systems was negative.    Objective:     Vision by Snellen chart: right eye:20/20, left eye:20/20 corrected lenses Blood pressure 152/78, pulse 72, temperature 98.1 F (36.7 C), temperature source Oral, height 5' 2.5" (1.588 m), weight 154 lb (69.854 kg). Body mass index is 27.72 kg/(m^2).  BP 152/78  Pulse 72  Temp(Src) 98.1 F (36.7 C) (Oral)  Ht 5' 2.5" (1.588 m)  Wt 154 lb (69.854 kg)  BMI 27.72 kg/m2  General Appearance:    Alert, cooperative, no distress, appears stated age  Head:    Normocephalic, without obvious abnormality, atraumatic  Eyes:    PERRL, conjunctiva/corneas clear, EOM's intact, fundi    benign, both eyes       Ears:    Normal TM's and external ear canals, both ears  Nose:   Nares normal, septum midline, mucosa normal, no drainage    or sinus tenderness    Throat:   Lips, mucosa, and tongue normal; teeth and gums normal  Neck:   Supple, symmetrical, trachea midline, no adenopathy;       thyroid:  No enlargement/tenderness/nodules; no carotid   bruit or JVD  Back:     Symmetric, no curvature, ROM normal, no CVA tenderness  Lungs:     Clear to auscultation bilaterally, respirations unlabored  Chest wall:    No tenderness or deformity  Heart:    Regular rate and rhythm, S1 and S2 normal, no murmur, rub   or gallop  Abdomen:     Soft, non-tender, bowel sounds active all four quadrants,    no masses, no organomegaly  Genitalia:    Normal male without lesion, discharge or tenderness  Rectal:    Normal tone, normal prostate, no masses or tenderness;   guaiac negative stool  Extremities:   Extremities normal, atraumatic, no cyanosis or edema  Pulses:   2+ and symmetric all extremities  Skin:   Skin color, texture, turgor normal, no rashes or lesions  Lymph nodes:   Cervical, supraclavicular, and axillary nodes normal  Neurologic:   CNII-XII intact. Normal strength, sensation and reflexes      throughout       Assessment:     Patient presents for yearly preventative medicine examination.   all immunizations and health maintenance protocols were reviewed with the patient and they are up to date with these protocols.   screening laboratory values were reviewed with the patient including screening of hyperlipidemia PSA renal function and hepatic function.   There medications past medical history social history problem list and allergies were reviewed in detail.   Goals were established with regard to weight loss exercise diet in compliance with medications      Plan:     During the course of the visit the patient was educated and counseled about appropriate screening and preventive services including:    Influenza vaccine  Td vaccine  Prostate cancer screening  Diet review for nutrition referral? Yes ____  Not Indicated  ____   Patient Instructions (the written plan) was given to the patient.  Medicare Attestation I have personally reviewed: The patient's medical and social history Their use of alcohol, tobacco or illicit drugs Their current medications and supplements The patient's functional ability including ADLs,fall risks, home safety risks, cognitive, and hearing and visual impairment Diet and physical activities Evidence for depression or mood disorders  The patient's weight, height, BMI, and visual acuity have been recorded in the chart.  I have made referrals, counseling, and provided education to the patient based on review of the above and I have provided the patient with a written personalized care plan for preventive services.     Carrie Mew, MD   02/11/2012

## 2012-02-11 NOTE — Progress Notes (Signed)
Addended by: Rita Ohara R on: 02/11/2012 11:15 AM   Modules accepted: Orders

## 2012-04-27 ENCOUNTER — Ambulatory Visit (INDEPENDENT_AMBULATORY_CARE_PROVIDER_SITE_OTHER): Payer: Medicare Other | Admitting: Internal Medicine

## 2012-04-27 ENCOUNTER — Encounter: Payer: Self-pay | Admitting: Internal Medicine

## 2012-04-27 VITALS — BP 140/70 | HR 72 | Temp 98.6°F | Resp 16 | Ht 63.0 in | Wt 152.0 lb

## 2012-04-27 DIAGNOSIS — R413 Other amnesia: Secondary | ICD-10-CM | POA: Insufficient documentation

## 2012-04-27 DIAGNOSIS — F3289 Other specified depressive episodes: Secondary | ICD-10-CM

## 2012-04-27 DIAGNOSIS — E785 Hyperlipidemia, unspecified: Secondary | ICD-10-CM

## 2012-04-27 DIAGNOSIS — F329 Major depressive disorder, single episode, unspecified: Secondary | ICD-10-CM

## 2012-04-27 MED ORDER — SERTRALINE HCL 25 MG PO TABS: 25.0000 mg | ORAL_TABLET | Freq: Every day | ORAL | Status: DC

## 2012-04-27 NOTE — Patient Instructions (Addendum)
You  passed all memory tests with extreme precision your score was 100% and there is absolutely no evidence of a memory disorder Alzheimer's There is absolutely no clinical reason for you to take Aricept or Namenda.  I do believe that you have situational depression. And would like to recommend a very low dose antidepressant called Zoloft 25 mg  I believe that a trip away from [place] to visit with your grandchildren would help with your spirits immensely. I do believe that depression sometimes makes it appear that we are losing  our memory when in reality we are simply closing down our emotions

## 2012-04-27 NOTE — Progress Notes (Signed)
Subjective:    Patient ID: [name], male    DOB: 03/08/1922, 78 y.o.   MRN: [id number]  HPI The patient is under an extreme amount of stress still with his wife who has obsessive-compulsive disorder.  She has diagnosed him with Alzheimer's and wants him to be on both Aricept and Namenda.  We gave him a mental status examination that he passed 100% of all tests including reversal of numbers memory of 3 objects at 5 minutes the clock tests and interpretation of proverbs.  I see no evidence of a memory disorder this gentleman has excellent recently but is under severe stress we talked about the effect of stress and depression   Review of Systems  Constitutional: Negative for fever and fatigue.  HENT: Negative for hearing loss, congestion, neck pain and postnasal drip.   Eyes: Negative for discharge, redness and visual disturbance.  Respiratory: Negative for cough, shortness of breath and wheezing.   Cardiovascular: Negative for leg swelling.  Gastrointestinal: Negative for abdominal pain, constipation and abdominal distention.  Genitourinary: Negative for urgency and frequency.  Musculoskeletal: Negative for joint swelling and arthralgias.  Skin: Negative for color change and rash.  Neurological: Negative for weakness and light-headedness.  Hematological: Negative for adenopathy.  Psychiatric/Behavioral: Negative for behavioral problems.       Past Medical History  Diagnosis Date  . OTH MALIG NEOPLASM SKIN OTH\T\UNSPEC PARTS FACE 02/10/2009  . HYPERLIPIDEMIA 08/19/2007  . 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 GERD 08/19/2007  . BENIGN PROSTATIC HYPERTROPHY 08/19/2007  . ACTINIC KERATOSIS, EAR, LEFT 06/13/2008  . LOW BACK PAIN 08/19/2007  . SYNCOPE 09/10/2010  . DIZZINESS, CHRONIC 10/02/2010  . DYSPNEA ON EXERTION 09/10/2010  . PROSTATE SPECIFIC ANTIGEN, ELEVATED, CHRONIC 06/15/2010    History   Social History  . Marital Status: Married    Spouse Name: N/A   Number of Children: N/A  . Years of Education: N/A   Occupational History  . Not on file.   Social History Main Topics  . Smoking status: Never Smoker   . Smokeless tobacco: Never Used  . Alcohol Use: No  . Drug Use: No  . Sexually Active: Not Currently   Other Topics Concern  . Not on file   Social History Narrative  . No narrative on file    Past Surgical History  Procedure Date  . Total knee arthroplasty 2007    left knee  . Transurethral resection of prostate     Family History  Problem Relation Age of Onset  . Arthritis Mother   . Cancer Father   . Prostate cancer Father     No Known Allergies  Current Outpatient Prescriptions on File Prior to Visit  Medication Sig Dispense Refill  . carisoprodol (SOMA) 350 MG tablet Take 1 tablet (350 mg total) by mouth 4 (four) times daily as needed.  50 tablet  1  . CELEBREX 200 MG capsule TAKE 1 CAPSULE DAILY  90 capsule  3  . rosuvastatin (CRESTOR) 20 MG tablet Take 1 tablet (20 mg total) by mouth as directed. Every Monday night and every Friday night  30 tablet  3  . sertraline (ZOLOFT) 25 MG tablet Take 1 tablet (25 mg total) by mouth daily.  30 tablet  3    BP 140/70  Pulse 72  Temp 98.6 F (37 C)  Resp 16  Ht 5\' 3"  (1.6 m)  Wt 152 lb (68.947 kg)  BMI 26.93 kg/m2    Objective:   Physical  Exam  Nursing note and vitals reviewed. Constitutional: He appears well-developed and well-nourished.  HENT:  Head: Normocephalic and atraumatic.  Eyes: Conjunctivae are normal. Pupils are equal, round, and reactive to light.  Neck: Normal range of motion. Neck supple.  Cardiovascular: Normal rate and regular rhythm.   Pulmonary/Chest: Effort normal and breath sounds normal.  Abdominal: Soft. Bowel sounds are normal.          Assessment & Plan:  I have recommended that Zoloft 25 mg by mouth daily to help with his depression and anxiety have recommended that he is secured we'll cover needs to get away and have  recommended that he take a trip to visit his grandchildren to renew his perspective.  I have also completed psychiatric testing which shows no evidence of a memory disorder

## 2012-07-06 ENCOUNTER — Other Ambulatory Visit: Payer: Self-pay | Admitting: Urology

## 2012-07-07 ENCOUNTER — Ambulatory Visit: Payer: Medicare Other | Admitting: Internal Medicine

## 2012-07-09 ENCOUNTER — Encounter (HOSPITAL_BASED_OUTPATIENT_CLINIC_OR_DEPARTMENT_OTHER): Payer: Self-pay | Admitting: *Deleted

## 2012-07-10 ENCOUNTER — Encounter (HOSPITAL_BASED_OUTPATIENT_CLINIC_OR_DEPARTMENT_OTHER): Payer: Self-pay | Admitting: *Deleted

## 2012-07-10 NOTE — Progress Notes (Signed)
NPO AFTER MN. ARRIVES AT 0600. NEEDS HG . CURRENT EKG W/ CHART. REVIEWED [facility] GUIDELINES , WILL BRING MEDS. WILL DO FLEET ENEMA AM OF SURG.

## 2012-07-15 NOTE — H&P (Signed)
s Problems  1. Balanitis 607.1 2. Benign Prostatic Hypertrophy With Urinary Obstruction 600.01 3. 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 Nocturia 788.43 5. Prostate Cancer 185 6. Prostate Hard Area Or Nodule 600.10 7. PSA,Elevated 790.93 8. Urethral Stricture 598.9 9. Urinary Stream Is Smaller 788.62  History of Present Illness  Mr. [name] returns today in f/u.  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  He was originally placed on finasteride when the PSA got up to 19 and has since been switched to [place] to reduce his med count.  His PSA has been rising slowly on treatment with a PSADT of about 6 years and the PSA prior to his last in October was 13.24 and it was13.92 in April.  He is now complaining of some increased voiding difficulty with intermittancy with 2-4 voids before feeling complete.  He has some increased nocturia 2-3x.  He has no dysuria or hematuria.  Acid foods are causing some increased irritative symptoms.   Past Medical History Problems  1. History of  Arthritis V13.4 2. History of  Delays In Starting Urination (Hesitancy) 788.64 3. History of  Esophageal Reflux 530.81 4. History of  Peptic Ulcer V12.71 5. History of  Prostate Cancer V10.46 6. History of  Prostatitis 601.9 7. History of  Urinary Frequency  Surgical History Problems  1. History of  Back Surgery 2. History of  Biopsy Of The Prostate Needle 3. History of  Knee Replacement 4. History of  Transurethral Resection Of Prostate  Current Meds 1. CeleBREX 200 MG Oral Capsule; Therapy: 30Nov2012 to 2. Crestor 20 MG Oral Tablet; Therapy: 15Aug2012 to 3. Jalyn 0.5-0.4 MG Oral Capsule; Take one capsule q day; Therapy: 28Mar2012 to  (Evaluate:16Jul2013)  Requested for: 17Apr2013; Last Rx:17Apr2013  Allergies Medication  1. No Known Drug Allergies  Family History Problems  1. Family history of  Family Health Status Number Of  Children 3-sons  Social History Problems  1. Caffeine Use 1 per day 2. Marital History - Currently Married 3. Never A Smoker 4. Retired From Work Denied  5. Alcohol Use 6. Tobacco Use   Past and social history reviewed and updated.   Review of Systems  Gastrointestinal: constipation, but no diarrhea.  Constitutional: no recent weight loss.  Musculoskeletal: no bone pain.    Vitals Vital Signs [Data Includes: Last 1 Day]  05Jul2013 01:06PM  Blood Pressure: 130 / 65 Temperature: 98.5 F Heart Rate: 66  Physical Exam Constitutional: Well nourished and well developed . No acute distress.  Pulmonary: No respiratory distress and normal respiratory rhythm and effort.  Cardiovascular: Heart rate and rhythm are normal . No peripheral edema.    Results/Data  Urine [Data Includes: Last 1 Day]   05Jul2013  COLOR YELLOW   APPEARANCE CLEAR   SPECIFIC GRAVITY 1.010   pH 6.5   GLUCOSE NEG mg/dL  BILIRUBIN NEG   KETONE NEG mg/dL  BLOOD NEG   PROTEIN NEG mg/dL  UROBILINOGEN 0.2 mg/dL  NITRITE NEG   LEUKOCYTE ESTERASE NEG    Flow Rate: Voided 204 ml. A peak flow rate of 20ml/s, mean flow rate of 20ml/s and intermittant. flow curve . PSA Flowsheet Tempie Donning Includes: Last 3 Years]   10Apr2013 15Oct2012 09Nov2010  PSA 13.92 ng/mL 13.24 ng/mL 12.50 ng/mL  Free PSA     % Free PSA      03 Jul 2012 12:52 PM   UA With REFLEX  COLOR YELLOW       APPEARANCE CLEAR       SPECIFIC GRAVITY 1.010       pH 6.5       GLUCOSE NEG       BILIRUBIN NEG       KETONE NEG       BLOOD NEG       PROTEIN NEG       UROBILINOGEN 0.2       NITRITE NEG       LEUKOCYTE ESTERASE NEG   Procedure  Procedure: Cystoscopy   Indication: Lower Urinary Tract Symptoms.  Informed Consent: Risks, benefits, and potential adverse events were discussed and informed consent was obtained from the patient.  Prep: The patient was prepped with betadine.  Antibiotic prophylaxis: Ciprofloxacin.  Procedure Note:   Urethral meatus:. No abnormalities.  Anterior urethra:. A mild stricture was present in the membranous urethra and was dilated (to 77fr with scope from about 38fr).  Prostatic urethra:. There was visual obstruction of the prostatic urethra. He has evidence of his prior TURP but does have some regrowth at the bladder neck posteriorly and at the apex anteriorly which is somewhat obstructing.  Bladder: Visulization was clear. The ureteral orifices were in the normal anatomic position bilaterally and had clear efflux of urine. A systematic survey of the bladder demonstrated no bladder tumors or stones. The mucosa was smooth without abnormalities. Examination of the bladder demonstrated moderate trabeculation cellules. The patient tolerated the procedure well.  Complications: None.    Assessment Assessed  1. Benign Prostatic Hypertrophy With Urinary Obstruction 600.01 2. Prostate Cancer 185   He has progressive voiding symptoms with a mild membranous stricture and obstructing prostatic regrowth. His PSA has risen on [place] and he needs a repeat biopsy.   Plan  Bladder Neck Contracture (596.0)  1. Complex Uroflowmetry  Done: 05Jul2013 2. Cysto  Done: 05Jul2013 Urethral Stricture (598.9)  3. Follow-up Schedule Surgery Office  Follow-up  Requested for: 05Jul2013   I am going to set him up for [place] prostate biopsy and TURP and have reviewed the risks in detail today. He has had both procedures previously.

## 2012-07-15 NOTE — Anesthesia Preprocedure Evaluation (Addendum)
Anesthesia Evaluation  Patient identified by MRN, date of birth, ID band Patient awake    Reviewed: Allergy & Precautions, H&P , NPO status , Patient's Chart, lab work & pertinent test results  Airway Mallampati: II TM Distance: >3 FB Neck ROM: full    Dental No notable dental hx. (+) Teeth Intact and Dental Advisory Given   Pulmonary neg pulmonary ROS, shortness of breath and with exertion,  breath sounds clear to auscultation  Pulmonary exam normal       Cardiovascular Exercise Tolerance: Good negative cardio ROS  Rhythm:regular Rate:Normal     Neuro/Psych ICA stenosis 50% negative neurological ROS  negative psych ROS   GI/Hepatic negative GI ROS, Neg liver ROS, GERD-  Controlled,  Endo/Other  negative endocrine ROS  Renal/GU negative Renal ROS  negative genitourinary   Musculoskeletal   Abdominal   Peds  Hematology negative hematology ROS (+)   Anesthesia Other Findings   Reproductive/Obstetrics negative OB ROS                           Anesthesia Physical Anesthesia Plan  ASA: II  Anesthesia Plan: General   Post-op Pain Management:    Induction: Intravenous  Airway Management Planned: LMA  Additional Equipment:   Intra-op Plan:   Post-operative Plan:   Informed Consent: I have reviewed the patients History and Physical, chart, labs and discussed the procedure including the risks, benefits and alternatives for the proposed anesthesia with the patient or authorized representative who has indicated his/her understanding and acceptance.   Dental Advisory Given  Plan Discussed with: CRNA and Surgeon  Anesthesia Plan Comments:         Anesthesia Quick Evaluation

## 2012-07-16 ENCOUNTER — Ambulatory Visit (HOSPITAL_BASED_OUTPATIENT_CLINIC_OR_DEPARTMENT_OTHER): Payer: Medicare Other | Admitting: Anesthesiology

## 2012-07-16 ENCOUNTER — Encounter (HOSPITAL_BASED_OUTPATIENT_CLINIC_OR_DEPARTMENT_OTHER)
Admission: RE | Disposition: A | Discharge status: Home / Self Care | Payer: Self-pay | Source: Ambulatory Visit | Attending: Urology

## 2012-07-16 ENCOUNTER — Ambulatory Visit (HOSPITAL_BASED_OUTPATIENT_CLINIC_OR_DEPARTMENT_OTHER)
Admission: RE | Admit: 2012-07-16 | Discharge: 2012-07-17 | Disposition: A | Discharge status: Home / Self Care | Payer: Medicare Other | Source: Ambulatory Visit | Attending: Urology | Admitting: Urology

## 2012-07-16 ENCOUNTER — Encounter (HOSPITAL_BASED_OUTPATIENT_CLINIC_OR_DEPARTMENT_OTHER): Payer: Self-pay | Admitting: *Deleted

## 2012-07-16 ENCOUNTER — Encounter (HOSPITAL_BASED_OUTPATIENT_CLINIC_OR_DEPARTMENT_OTHER): Payer: Self-pay | Admitting: Anesthesiology

## 2012-07-16 DIAGNOSIS — N400 Enlarged prostate without lower urinary tract symptoms: Secondary | ICD-10-CM

## 2012-07-16 DIAGNOSIS — E785 Hyperlipidemia, unspecified: Secondary | ICD-10-CM

## 2012-07-16 DIAGNOSIS — R413 Other amnesia: Secondary | ICD-10-CM

## 2012-07-16 DIAGNOSIS — Z79899 Other long term (current) drug therapy: Secondary | ICD-10-CM | POA: Insufficient documentation

## 2012-07-16 DIAGNOSIS — C61 Malignant neoplasm of prostate: Secondary | ICD-10-CM | POA: Insufficient documentation

## 2012-07-16 DIAGNOSIS — M545 Low back pain: Secondary | ICD-10-CM

## 2012-07-16 DIAGNOSIS — N320 Bladder-neck obstruction: Secondary | ICD-10-CM | POA: Insufficient documentation

## 2012-07-16 DIAGNOSIS — N138 Other obstructive and reflux uropathy: Secondary | ICD-10-CM | POA: Insufficient documentation

## 2012-07-16 DIAGNOSIS — N401 Enlarged prostate with lower urinary tract symptoms: Secondary | ICD-10-CM | POA: Insufficient documentation

## 2012-07-16 HISTORY — DX: Personal history of malignant neoplasm of prostate: Z85.46

## 2012-07-16 HISTORY — DX: Occlusion and stenosis of unspecified carotid artery: I65.29

## 2012-07-16 HISTORY — PX: TRANSURETHRAL RESECTION OF PROSTATE: SHX73

## 2012-07-16 HISTORY — PX: PROSTATE BIOPSY: SHX241

## 2012-07-16 LAB — POCT HEMOGLOBIN-HEMACUE: Hemoglobin: 14.8 g/dL (ref 13.0–17.0)

## 2012-07-16 SURGERY — BIOPSY, PROSTATE
Anesthesia: General | Site: Prostate

## 2012-07-16 MED ORDER — FENTANYL CITRATE 0.05 MG/ML IJ SOLN
INTRAMUSCULAR | Status: DC | PRN
  Administered 2012-07-16 (×2): 25 ug via INTRAVENOUS

## 2012-07-16 MED ORDER — ZOLPIDEM TARTRATE 5 MG PO TABS: 5.0000 mg | ORAL_TABLET | Freq: Every evening | ORAL | Status: DC | PRN

## 2012-07-16 MED ORDER — SERTRALINE HCL 25 MG PO TABS: 25.0000 mg | ORAL_TABLET | Freq: Every day | ORAL | Status: DC

## 2012-07-16 MED ORDER — PROPOFOL 10 MG/ML IV EMUL
INTRAVENOUS | Status: DC | PRN
  Administered 2012-07-16: 200 mg via INTRAVENOUS

## 2012-07-16 MED ORDER — FLEET ENEMA 7-19 GM/118ML RE ENEM: 1.0000 | ENEMA | Freq: Once | RECTAL | Status: DC

## 2012-07-16 MED ORDER — HYDROCODONE-ACETAMINOPHEN 5-325 MG PO TABS: 1.0000 | ORAL_TABLET | Freq: Four times a day (QID) | ORAL | Status: AC | PRN

## 2012-07-16 MED ORDER — CIPROFLOXACIN HCL 250 MG PO TABS: 250.0000 mg | ORAL_TABLET | Freq: Two times a day (BID) | ORAL | Status: AC

## 2012-07-16 MED ORDER — BELLADONNA ALKALOIDS-OPIUM 16.2-60 MG RE SUPP
RECTAL | Status: DC | PRN
  Administered 2012-07-16: 1 via RECTAL

## 2012-07-16 MED ORDER — ONDANSETRON HCL 4 MG/2ML IJ SOLN: 4.0000 mg | INTRAMUSCULAR | Status: DC | PRN

## 2012-07-16 MED ORDER — KCL IN DEXTROSE-NACL 20-5-0.45 MEQ/L-%-% IV SOLN
INTRAVENOUS | Status: DC
  Administered 2012-07-16 (×2): via INTRAVENOUS

## 2012-07-16 MED ORDER — LACTATED RINGERS IV SOLN: INTRAVENOUS | Status: DC

## 2012-07-16 MED ORDER — CIPROFLOXACIN HCL 250 MG PO TABS
250.0000 mg | ORAL_TABLET | Freq: Two times a day (BID) | ORAL | Status: DC
  Administered 2012-07-16: 250 mg via ORAL

## 2012-07-16 MED ORDER — HYDROMORPHONE HCL PF 1 MG/ML IJ SOLN: 0.5000 mg | INTRAMUSCULAR | Status: DC | PRN

## 2012-07-16 MED ORDER — ACETAMINOPHEN 325 MG PO TABS: 650.0000 mg | ORAL_TABLET | ORAL | Status: DC | PRN

## 2012-07-16 MED ORDER — ATORVASTATIN CALCIUM 10 MG PO TABS: 10.0000 mg | ORAL_TABLET | Freq: Every day | ORAL | Status: DC

## 2012-07-16 MED ORDER — EPHEDRINE SULFATE 50 MG/ML IJ SOLN
INTRAMUSCULAR | Status: DC | PRN
  Administered 2012-07-16 (×2): 10 mg via INTRAVENOUS

## 2012-07-16 MED ORDER — HYOSCYAMINE SULFATE 0.125 MG SL SUBL: 0.1250 mg | SUBLINGUAL_TABLET | SUBLINGUAL | Status: DC | PRN

## 2012-07-16 MED ORDER — DEXTROSE 5 % IV SOLN
1.0000 g | Freq: Once | INTRAVENOUS | Status: AC
  Administered 2012-07-16: 07:00:00 via INTRAVENOUS

## 2012-07-16 MED ORDER — VITAMIN C 500 MG PO TABS: 500.0000 mg | ORAL_TABLET | Freq: Every day | ORAL | Status: DC

## 2012-07-16 MED ORDER — CIPROFLOXACIN IN D5W 400 MG/200ML IV SOLN
400.0000 mg | INTRAVENOUS | Status: AC
  Administered 2012-07-16: 400 mg via INTRAVENOUS

## 2012-07-16 MED ORDER — ONDANSETRON HCL 4 MG/2ML IJ SOLN
INTRAMUSCULAR | Status: DC | PRN
  Administered 2012-07-16: 4 mg via INTRAVENOUS

## 2012-07-16 MED ORDER — SODIUM CHLORIDE 0.9 % IR SOLN
Status: DC | PRN
  Administered 2012-07-16: 3000 mL via INTRAVESICAL

## 2012-07-16 MED ORDER — BISACODYL 10 MG RE SUPP: 10.0000 mg | Freq: Every day | RECTAL | Status: DC | PRN

## 2012-07-16 MED ORDER — HYDROCODONE-ACETAMINOPHEN 5-325 MG PO TABS
1.0000 | ORAL_TABLET | ORAL | Status: DC | PRN
  Administered 2012-07-16 – 2012-07-17 (×6): 1 via ORAL

## 2012-07-16 MED ORDER — LACTATED RINGERS IV SOLN
INTRAVENOUS | Status: DC
  Administered 2012-07-16: 100 mL/h via INTRAVENOUS

## 2012-07-16 MED ORDER — LIDOCAINE HCL (CARDIAC) 20 MG/ML IV SOLN
INTRAVENOUS | Status: DC | PRN
  Administered 2012-07-16: 50 mg via INTRAVENOUS

## 2012-07-16 MED ORDER — CARISOPRODOL 350 MG PO TABS: 350.0000 mg | ORAL_TABLET | Freq: Four times a day (QID) | ORAL | Status: DC | PRN

## 2012-07-16 MED ORDER — FENTANYL CITRATE 0.05 MG/ML IJ SOLN
25.0000 ug | INTRAMUSCULAR | Status: DC | PRN
  Administered 2012-07-16 (×2): 25 ug via INTRAVENOUS

## 2012-07-16 MED ORDER — DOCUSATE SODIUM 100 MG PO CAPS
100.0000 mg | ORAL_CAPSULE | Freq: Two times a day (BID) | ORAL | Status: DC
  Administered 2012-07-16: 100 mg via ORAL

## 2012-07-16 SURGICAL SUPPLY — 37 items
BAG DRAIN URO-CYSTO SKYTR STRL (DRAIN) ×3 IMPLANT
BAG DRN ANRFLXCHMBR STRAP LEK (BAG)
BAG DRN UROCATH (DRAIN) ×2
BAG URINE DRAINAGE (UROLOGICAL SUPPLIES) ×1 IMPLANT
BAG URINE LEG 19OZ MD ST LTX (BAG) IMPLANT
CANISTER SUCT LVC 12 LTR MEDI- (MISCELLANEOUS) ×2 IMPLANT
CATH FOLEY 2WAY SLVR  5CC 20FR (CATHETERS)
CATH FOLEY 2WAY SLVR  5CC 22FR (CATHETERS)
CATH FOLEY 2WAY SLVR 30CC 20FR (CATHETERS) ×3 IMPLANT
CATH FOLEY 2WAY SLVR 30CC 22FR (CATHETERS) ×1 IMPLANT
CATH FOLEY 2WAY SLVR 5CC 20FR (CATHETERS) IMPLANT
CATH FOLEY 2WAY SLVR 5CC 22FR (CATHETERS) IMPLANT
CATH HEMA 3WAY 30CC 24FR COUDE (CATHETERS) IMPLANT
CATH HEMA 3WAY 30CC 24FR RND (CATHETERS) IMPLANT
CLOTH BEACON ORANGE TIMEOUT ST (SAFETY) ×3 IMPLANT
DRAPE CAMERA CLOSED 9X96 (DRAPES) ×3 IMPLANT
DRESSING TELFA 8X3 (GAUZE/BANDAGES/DRESSINGS) ×3 IMPLANT
ELECT BUTTON HF 24-28F 2 30DE (ELECTRODE) IMPLANT
ELECT LOOP HF 26F 30D .35MM (CUTTING LOOP) IMPLANT
ELECT LOOP MED HF 24F 12D (CUTTING LOOP) ×3 IMPLANT
ELECT NEEDLE 45D HF 24-28F 12D (CUTTING LOOP) IMPLANT
ELECT REM PT RETURN 9FT ADLT (ELECTROSURGICAL) ×3
ELECTRODE REM PT RTRN 9FT ADLT (ELECTROSURGICAL) ×2 IMPLANT
GLOVE ECLIPSE 6.5 STRL STRAW (GLOVE) ×1 IMPLANT
GLOVE INDICATOR 6.5 STRL GRN (GLOVE) ×1 IMPLANT
GLOVE SURG SS PI 8.0 STRL IVOR (GLOVE) ×3 IMPLANT
GOWN STRL REIN XL XLG (GOWN DISPOSABLE) ×3 IMPLANT
GOWN SURGICAL LARGE (GOWNS) ×1 IMPLANT
HOLDER FOLEY CATH W/STRAP (MISCELLANEOUS) ×1 IMPLANT
KIT ASPIRATION TUBING (SET/KITS/TRAYS/PACK) ×1 IMPLANT
LOOP CUTTING 24FR OLYMPUS (CUTTING LOOP) IMPLANT
PACK CYSTOSCOPY (CUSTOM PROCEDURE TRAY) ×3 IMPLANT
PLUG CATH AND CAP STER (CATHETERS) IMPLANT
SET ASPIRATION TUBING (TUBING) IMPLANT
SURGILUBE 2OZ TUBE FLIPTOP (MISCELLANEOUS) ×3 IMPLANT
TOWEL OR 17X24 6PK STRL BLUE (TOWEL DISPOSABLE) ×3 IMPLANT
UNDERPAD 30X30 INCONTINENT (UNDERPADS AND DIAPERS) ×3 IMPLANT

## 2012-07-16 NOTE — Progress Notes (Signed)
Patient ID: [name], male   DOB: October 13, 1922, 78 y.o.   MRN: [id number]  He is doing well and his urine is light pink.  Foley out in am if the urine continues to clear.

## 2012-07-16 NOTE — Brief Op Note (Signed)
07/16/2012  8:32 AM  PATIENT:  [name]  78 y.o. male  PRE-OPERATIVE DIAGNOSIS:  BPH WITH BLADDER OUTLET OBSTRUCTION AND HISTORY OF PROSTATE CANCER  POST-OPERATIVE DIAGNOSIS:  BPH WITH BLADDER OUTLET OBSTRUCTION AND HISTORY OF PROSTATE CANCER  PROCEDURE:  Procedure(s) (LRB): PROSTATE [place] and BIOPSY (N/A) TRANSURETHRAL RESECTION OF THE PROSTATE (TURP) ()  SURGEON:  Surgeon(s) and Role:    * Anner Crete, MD - Primary  PHYSICIAN ASSISTANT:   ASSISTANTS: none   ANESTHESIA:   general  EBL:     BLOOD ADMINISTERED:none  DRAINS: Urinary Catheter (Foley)   LOCAL MEDICATIONS USED:  NONE  SPECIMEN:  Source of Specimen:  12 prostate cores and TUR chips.  DISPOSITION OF SPECIMEN:  PATHOLOGY  COUNTS:  YES  TOURNIQUET:  * No tourniquets in log *  DICTATION: .Other Dictation: Dictation Number [id number]  PLAN OF CARE: Admit for overnight observation  PATIENT DISPOSITION:  PACU - hemodynamically stable.   Delay start of Pharmacological VTE agent (>24hrs) due to surgical blood loss or risk of bleeding: yes

## 2012-07-16 NOTE — Anesthesia Procedure Notes (Signed)
Procedure Name: LMA Insertion Date/Time: 07/16/2012 7:36 AM Performed by: Lance Coon Pre-anesthesia Checklist: Patient identified, Timeout performed, Emergency Drugs available, Suction available and Patient being monitored Patient Re-evaluated:Patient Re-evaluated prior to inductionOxygen Delivery Method: Circle system utilized Preoxygenation: Pre-oxygenation with 100% oxygen Intubation Type: IV induction Ventilation: Mask ventilation without difficulty LMA: LMA inserted LMA Size: 4.0 Placement Confirmation: breath sounds checked- equal and bilateral and positive ETCO2 Dental Injury: Teeth and Oropharynx as per pre-operative assessment

## 2012-07-16 NOTE — Interval H&P Note (Signed)
History and Physical Interval Note:  07/16/2012 7:22 AM  [name]  has presented today for surgery, with the diagnosis of BPH WITH BLADDER OUTLET OBSTRUCTION AND HISTORY OF PROSTATE CANCER  The various methods of treatment have been discussed with the patient and family. After consideration of risks, benefits and other options for treatment, the patient has consented to  Procedure(s) (LRB): PROSTATE BIOPSY (N/A) TRANSURETHRAL RESECTION OF THE PROSTATE (TURP) (N/A) as a surgical intervention .  The patient's history has been reviewed, patient examined, no change in status, stable for surgery.  I have reviewed the patients' chart and labs.  Questions were answered to the patient's satisfaction.     [name] J

## 2012-07-16 NOTE — Transfer of Care (Signed)
Immediate Anesthesia Transfer of Care Note  Patient: [name]  Procedure(s) Performed: Procedure(s) (LRB): PROSTATE BIOPSY (N/A) TRANSURETHRAL RESECTION OF THE PROSTATE (TURP) ()  Patient Location: PACU  Anesthesia Type: General  Level of Consciousness: sedated  Airway & Oxygen Therapy: Patient Spontanous Breathing and Patient connected to face mask oxygen  Post-op Assessment: Report given to PACU RN and Post -op Vital signs reviewed and stable  Post vital signs: Reviewed and stable  Complications: No apparent anesthesia complications

## 2012-07-16 NOTE — Anesthesia Postprocedure Evaluation (Signed)
  Anesthesia Post-op Note  Patient: [name]  Procedure(s) Performed: Procedure(s) (LRB): PROSTATE BIOPSY (N/A) TRANSURETHRAL RESECTION OF THE PROSTATE (TURP) ()  Patient Location: PACU  Anesthesia Type: General  Level of Consciousness: awake and alert   Airway and Oxygen Therapy: Patient Spontanous Breathing  Post-op Pain: mild  Post-op Assessment: Post-op Vital signs reviewed, Patient's Cardiovascular Status Stable, Respiratory Function Stable, Patent Airway and No signs of Nausea or vomiting  Post-op Vital Signs: stable  Complications: No apparent anesthesia complications

## 2012-07-17 ENCOUNTER — Encounter (HOSPITAL_BASED_OUTPATIENT_CLINIC_OR_DEPARTMENT_OTHER): Payer: Self-pay | Admitting: Urology

## 2012-07-17 NOTE — Progress Notes (Signed)
Foley catheter D/C'd 700cc of pink tinged urine in drainage bag Pt tolerated procedure well.

## 2012-07-17 NOTE — Progress Notes (Signed)
Patient voided 300cc of pink tinged urine in bathroom.

## 2012-07-17 NOTE — Progress Notes (Signed)
Patient resting quietly HOH easy to arouse skin warm and dry resp non labored  Foley cath patent draining light pink urine. IV patent  Patient denies any pain or discomfort this far. Will continue to monitor.

## 2012-07-17 NOTE — Op Note (Signed)
NAME:  [name], [name]            ACCOUNT NO.:  [id number]  MEDICAL RECORD NO.:  [id number]  LOCATION:                                 FACILITY:  PHYSICIAN:  Excell Seltzer. Annabell Howells, M.D.    DATE OF BIRTH:  01/07/1922  DATE OF PROCEDURE: DATE OF DISCHARGE:                              OPERATIVE REPORT   PROCEDURE:  Prostate ultrasound with ultrasound-guided prostate biopsy, and transurethral resection of the prostate.  PREOPERATIVE DIAGNOSIS:  History of prostate cancer with bladder outlet obstruction.  POSTOPERATIVE DIAGNOSIS:  History of prostate cancer with bladder outlet obstruction.  SURGEON:  Excell Seltzer. Annabell Howells, MD  ANESTHESIA:  General.  SPECIMEN:  Twelve prostate biopsy cores and TUR chips.  DRAINS:  A 22-French Foley catheter.  ESTIMATED BLOOD LOSS:  Minimal.  COMPLICATIONS:  None.  INDICATIONS:  [name] is an 78 year old white male with a history of T1b Gleason 6 adenocarcinoma of the prostate diagnosed at TURP in 2001.  He has been managed with active surveillance and Jalyn, his PSA has risen is approximately 80.  He has had some increased voiding symptoms lately and an office endoscopy demonstrated prostatic regrowth with obstruction and after discussing the options, we have elected to proceed with repeat prostate biopsy and TURP findings.  PROCEDURE IN DETAIL:  He was given Cipro and Rocephin and was taken to the operating room, where general anesthetic was induced.  He was rolled in left lateral decubitus position on the holding room stretcher, and a prostate ultrasound was performed.  The 10 MHz probe was used. Inspection of prostate demonstrated a 24.6 mL prostate with a width of 4.8 cm, height of 2.27 cm, and a length of 4.31 cm.  There were some hypoechoic areas in the left posterior lateral base and left posterior lateral apex.  He did have some prostatic calcifications in the transitional zone and enlarged anterior cyst in the midline.  The seminal  vesicles were unremarkable.  No other significant abnormalities were noted.  Once diagnostic scan had been performed, a 12-core pattern biopsy was obtained with care taken to target hypoechoic areas.  He was then placed on the operating room table in the lithotomy position.  He had been fitted with PAS hose initially as well.  His perineum and genitalia were prepped with Betadine solution.  He was draped in usual sterile fashion.  Cystoscopy was performed using a 22-French scope and 12-degree lens. Examination revealed a normal urethra.  He had a history of a membranous stricture, but it was easily bypassed by the scope.  Inspection of the prostate revealed some anterior regrowth proximally with obstruction and inspection of bladder revealed mild trabeculation.  No tumors or stones were noted.  Ureteral orifices were unremarkable.  A 28-French continuous flow resectoscope sheath was then passed without difficulty.  This was fitted with Wandra Scot handle, 12-degree lens, gyrus loop, and saline was used as an irrigant.  Resection of the obstructing proximal anterior tissue as well as some lateral tissue that was performed, injury into the cyst occurred during the resection.  Once the prostatic urethra was widely patent, the bladder was evacuated free of the chips and hemostasis was secured.  At this point, the scope  was removed and pressure on the bladder produced good stream.  A 22-French Foley catheter was inserted.  Balloon was filled with 30 mL of sterile fluid.  The catheter was then placed to straight drainage.  The patient was taken down from lithotomy position. His anesthetic was reversed.  He was moved to recovery room in stable condition.  There were no complications.     Excell Seltzer. Annabell Howells, M.D.     JJW/MEDQ  D:  07/16/2012  T:  07/17/2012  Job:  [id number]

## 2012-07-17 NOTE — Discharge Summary (Signed)
Physician Discharge Summary  Patient ID: [name] MRN: [id number] DOB/AGE: 07-25-1922 78 y.o.  Admit date: 07/16/2012 Discharge date: 07/17/2012  Admission Diagnoses:  Prostate cancer and BPH with BOO.  Discharge Diagnoses: Same Active Problems:  * No active hospital problems. *    Discharged Condition: good  Hospital Course: Mr. [name] has a history of T1b Gleason 6 prostate cancer on active surveillance.  He has had some increased voiding difficulty and a rising PSA.  He had a prostate biopsy and TURP on 7/18.  His foley was removed this morning and he has voided 300cc of pink urine.  He still has some intermittancy but is ready for discharge.  Consults: None  Significant Diagnostic Studies: Prostate biopsy pending.  Treatments: surgery: as above  Discharge Exam: Blood pressure 137/64, pulse 70, temperature 98.5 F (36.9 C), temperature source Oral, resp. rate 18, height 5\' 4"  (1.626 m), weight 66.225 kg (146 lb), SpO2 95.00%. General appearance: alert and no distress  Disposition: To home  Discharge Orders    Future Orders Please Complete By Expires   Discontinue IV        Medication List  As of 07/17/2012  7:12 AM   STOP taking these medications         sertraline 25 MG tablet         TAKE these medications         carisoprodol 350 MG tablet   Commonly known as: SOMA   Take 1 tablet (350 mg total) by mouth 4 (four) times daily as needed.      CELEBREX 200 MG capsule   Generic drug: celecoxib   TAKE 1 CAPSULE DAILY      ciprofloxacin 250 MG tablet   Commonly known as: CIPRO   Take 1 tablet (250 mg total) by mouth 2 (two) times daily.      HYDROcodone-acetaminophen 5-325 MG per tablet   Commonly known as: NORCO/VICODIN   Take 1 tablet by mouth every 6 (six) hours as needed for pain.      JALYN 0.5-0.4 MG Caps   Generic drug: Dutasteride-Tamsulosin HCl   Take by mouth daily.      rosuvastatin 20 MG tablet   Commonly known as: CRESTOR   Take  1 tablet (20 mg total) by mouth as directed. Every Monday night and every Friday night      vitamin C 500 MG tablet   Commonly known as: ASCORBIC ACID   Take 500 mg by mouth daily.           Follow-up Information    Follow up with Anner Crete, MD. (as scheduled)    Contact information:   [address] [place] [address] [place] [address] [phone]          Signed: Anner Crete 07/17/2012, 7:12 AM

## 2012-07-22 ENCOUNTER — Encounter (HOSPITAL_BASED_OUTPATIENT_CLINIC_OR_DEPARTMENT_OTHER): Payer: Self-pay

## 2012-07-23 ENCOUNTER — Other Ambulatory Visit: Payer: Self-pay | Admitting: Urology

## 2012-07-23 DIAGNOSIS — C61 Malignant neoplasm of prostate: Secondary | ICD-10-CM

## 2012-07-27 ENCOUNTER — Encounter (HOSPITAL_COMMUNITY)
Admission: RE | Admit: 2012-07-27 | Discharge: 2012-07-27 | Disposition: A | Discharge status: Home / Self Care | Payer: Medicare Other | Source: Ambulatory Visit | Attending: Urology | Admitting: Urology

## 2012-07-27 DIAGNOSIS — Z006 Encounter for examination for normal comparison and control in clinical research program: Secondary | ICD-10-CM | POA: Insufficient documentation

## 2012-07-27 DIAGNOSIS — Z96659 Presence of unspecified artificial knee joint: Secondary | ICD-10-CM | POA: Insufficient documentation

## 2012-07-27 DIAGNOSIS — C61 Malignant neoplasm of prostate: Secondary | ICD-10-CM | POA: Insufficient documentation

## 2012-07-27 DIAGNOSIS — M948X9 Other specified disorders of cartilage, unspecified sites: Secondary | ICD-10-CM | POA: Insufficient documentation

## 2012-07-27 MED ORDER — FLUDEOXYGLUCOSE F - 18 (FDG) INJECTION
10.8000 | Freq: Once | INTRAVENOUS | Status: AC | PRN
  Administered 2012-07-27: 10.8 via INTRAVENOUS

## 2012-08-10 ENCOUNTER — Ambulatory Visit: Payer: Medicare Other | Admitting: Internal Medicine

## 2012-08-12 ENCOUNTER — Ambulatory Visit
Admission: RE | Admit: 2012-08-12 | Discharge: 2012-08-12 | Disposition: A | Discharge status: Home / Self Care | Payer: Medicare Other | Source: Ambulatory Visit | Attending: Radiation Oncology | Admitting: Radiation Oncology

## 2012-08-12 ENCOUNTER — Encounter: Payer: Self-pay | Admitting: Radiation Oncology

## 2012-08-12 VITALS — BP 131/66 | HR 71 | Temp 98.1°F | Resp 18 | Ht 62.0 in | Wt 146.9 lb

## 2012-08-12 DIAGNOSIS — C61 Malignant neoplasm of prostate: Secondary | ICD-10-CM

## 2012-08-12 DIAGNOSIS — I6529 Occlusion and stenosis of unspecified carotid artery: Secondary | ICD-10-CM | POA: Insufficient documentation

## 2012-08-12 DIAGNOSIS — Z85828 Personal history of other malignant neoplasm of skin: Secondary | ICD-10-CM | POA: Insufficient documentation

## 2012-08-12 DIAGNOSIS — Z96659 Presence of unspecified artificial knee joint: Secondary | ICD-10-CM | POA: Insufficient documentation

## 2012-08-12 DIAGNOSIS — I658 Occlusion and stenosis of other precerebral arteries: Secondary | ICD-10-CM | POA: Insufficient documentation

## 2012-08-12 DIAGNOSIS — N401 Enlarged prostate with lower urinary tract symptoms: Secondary | ICD-10-CM | POA: Insufficient documentation

## 2012-08-12 DIAGNOSIS — R350 Frequency of micturition: Secondary | ICD-10-CM | POA: Insufficient documentation

## 2012-08-12 DIAGNOSIS — N138 Other obstructive and reflux uropathy: Secondary | ICD-10-CM | POA: Insufficient documentation

## 2012-08-12 DIAGNOSIS — E785 Hyperlipidemia, unspecified: Secondary | ICD-10-CM | POA: Insufficient documentation

## 2012-08-12 NOTE — Progress Notes (Signed)
HERE TODAY FOR CONSULT OF PROSTATE BIOPSY.  HAS FREQUENCY OF URINATION AND NOCTURIA.  GLEASON SCORE..6 AND 7 FROM MULTIPLE CORES    PSA...790.93.  HE WAS GIVEN FIRMAGON 240 MG AND WILL BE ON A 80 MG MAINTENANCE DOSE EVERY 28 DAYS.  SAYS HE IS LOSING WEIGHT AND NOT TRYING, ABOUT 2 LBS A MONTH OVER THE LAST 3 MONTHS FOR A TOTAL LOSS OF 6 LBS   MARRIED  NEVER SMOKED  NO ALCOHOL  RETIRED ENGINEER.

## 2012-08-12 NOTE — Progress Notes (Signed)
Radiation Oncology         [phone]) [phone] ________________________________  Initial outpatient Consultation  Name: [name] MRN: [id number]  Date: 08/12/2012  DOB: 1922/06/16  FA:[id number],HQIO EDWARD, MD  Leonard Crete, MD   REFERRING PHYSICIAN: Anner Crete, MD  DIAGNOSIS: The encounter diagnosis was Malignant neoplasm of prostate.  HISTORY OF PRESENT ILLNESS::[name] is a 78 y.o. male who is seen out of the courtesy of Dr. Bjorn Pippin  for consideration for radiation therapy as part of management of patient's recently diagnosed locally advanced prostate cancer. The patient has a prior history of stage TI B. Gleason's 6 adenocarcinoma prostate diagnosed by TURP in 2001. This was managed with active surveillance and Jalyn.  More recently the patient's PSA had risen to 13. In addition the patient is having increasing voiding problems. The patient was taken to the operating room by Dr. Annabell Howells on 07/16/2012. The patient underwent prostatic ultrasound and Ultrasound guided prostate biopsy as well  transurethral resection of the prostate. The prostate measured 24.6 cubic centimeters on ultrasound.  Pathology showed 8/12 specimens showing adenocarcinoma with total score of 6 or 7 Gleason's. in addition the prostate chips from the TURP revealed adenocarcinoma with a Gleason score 7. Some of the biopsy sites showed up to 80% involvement.  After the patient's TURP his urinary symptoms have begun to slowly improve.  given the above findings patient proceeded to undergo sodium chloride PET scan is no obvious evidence of metastatic spread.  Given the above findings and the patient's excellent performance status he's been referred to radiation oncology for consideration for treatment. It was also recommended patient be considered for short-term androgen ablation.   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    PAST SURGICAL HISTORY: Past Surgical History  Procedure Date  . Total knee arthroplasty 02-11-2006     left knee  oa  . Transurethral resection of prostate 07-29-2000    bph w/ boo  . Lumbar laminotomy and foraminotomy bilaterally 05-12-2007    L2 - 3 ;  SPONDYLOSIS W/ STENOSIS  . Cardiovascular stress test 09-20-2010  DR BENSIMHON    NORMAL NUCLEAR STUDY/ NO ISCHEMIA/ EF 68%  . Prostate biopsy 07/16/2012    Procedure: PROSTATE BIOPSY;  Surgeon: Leonard Crete, MD;  Location: [facility];  Service: Urology;  Laterality: N/A;  WITH ULTRASOUND ([facility] ULTRA SOUND TECH COMING) OWER  . Transurethral resection of prostate 07/16/2012    Procedure: TRANSURETHRAL RESECTION OF THE PROSTATE (TURP);  Surgeon: Leonard Crete, MD;  Location[facility];  Service: Urology;;  with gyrus    FAMILY HISTORY: family history includes Arthritis in his mother; Cancer in his father; and Prostate cancer in his father.  SOCIAL HISTORY:  reports that he has never smoked. He has never used smokeless tobacco. He reports that he does not drink alcohol or use illicit drugs.  ALLERGIES: Review of patient's allergies indicates no known allergies.  MEDICATIONS:  Current Outpatient Prescriptions  Medication Sig Dispense Refill  . degarelix (FIRMAGON)  80 MG injection Inject 80 mg into the skin every 28 (twenty-eight) days.      . carisoprodol (SOMA) 350 MG tablet Take 1 tablet (350 mg total) by mouth 4 (four) times daily as needed.  50 tablet  1   . CELEBREX 200 MG capsule TAKE 1 CAPSULE DAILY  90 capsule  3  . Dutasteride-Tamsulosin HCl (JALYN) 0.5-0.4 MG CAPS Take by mouth daily.      . rosuvastatin (CRESTOR) 20 MG tablet Take 1 tablet (20 mg total) by mouth as directed. Every Monday night and every Friday night  30 tablet  3  . sertraline (ZOLOFT) 25 MG tablet Take 1 tablet (25 mg total) by mouth daily.  30 tablet  3  . vitamin C (ASCORBIC ACID) 500 MG tablet Take 500 mg by mouth daily.        REVIEW OF SYSTEMS:  A 15 point review of systems is documented in the electronic medical record. This was obtained by the nursing staff. However, I reviewed this with the patient to discuss relevant findings and make appropriate changes. Patient denies any bony pain. He is quite functional for 78 year old gentleman actually continues to work part-time with [employer].  He is an Art gallery manager by trade.    PHYSICAL EXAM:  height is 5\' 2"  (1.575 m) and weight is 146 lb 14.4 oz (66.633 kg). His oral temperature is 98.1 F (36.7 C). His blood pressure is 131/66 and his pulse is 71. His respiration is 18.  the pupils are equal round and reactive to light. The extraocular eye movements are intact. The tongue is midline. There is no secondary infection noted in the oral cavity or posterior pharynx. There's no palpable supraclavicular cervical or axillary adenopathy. The lungs are clear to auscultation. The heart has a regular rhythm and rate. The abdomen is soft and nontender with normal bowel sounds. There is no inguinal adenopathy appreciated. Patient preferred not to have a prostate exam in light of his recent surgery.  On neurological examination motor strength is 5 out of 5 in the proximal and distal muscle groups of the upper lower extremities. Peripheral pulses are good. There is no appreciable edema in the extremities.   LABORATORY DATA:  Lab Results  Component Value Date   WBC 5.6 02/11/2012   HGB 14.8 07/16/2012   HCT 43.1 02/11/2012   MCV  90.8 02/11/2012   PLT 145.0* 02/11/2012   Lab Results  Component Value Date   NA 139 02/11/2012   K 4.3 02/11/2012   CL 105 02/11/2012   CO2 29 02/11/2012   Lab Results  Component Value Date   ALT 19 02/11/2012   AST 20 02/11/2012   ALKPHOS 67 02/11/2012   BILITOT 0.9 02/11/2012     RADIOGRAPHY: Nm Pet Mets (nopr) Whole Body (naf)  07/28/2012  *RADIOLOGY REPORT*  Clinical Data: Subsequent treatment strategy for prostate cancer.  NUCLEAR MEDICINE PET WHOLE BODY  Technique:  10.8 mCi F-18 NaF was injected intravenously. CT data was obtained and used for attenuation correction and anatomic localization only.  (This was not acquired as a diagnostic CT examination.) Additional exam technical data entered on technologist worksheet.  Comparison:  09/26/2004  Findings:  Neck: No mass no enlarged lymph nodes identified within the soft tissues of the neck.  Chest:  No hypermetabolic mediastinal or hilar nodes.  No suspicious pulmonary nodules on the CT scan. Prominent coronary artery calcifications identified.  Abdomen/Pelvis:  No abnormal hypermetabolic activity within the liver, pancreas, adrenal  glands, or spleen.  No hypermetabolic lymph nodes in the abdomen or pelvis.  [place]:  Within the mid shaft of the left humerus there is a medium sized focus of increased radiotraceruptake with a corresponding area of subtle increased sclerosis on CT, image 122 of the axial series.  A subtle asymmetric focus of increased uptake along the superior margin of the right acetabulum is identified, image 230 and 231.  Multifocal areas of increased uptake within the lower thoracic and lumbar spine are identified corresponding to areas of degenerative disc disease.  Within the medial compartment of the right knee there is increased radiotracer uptake corresponding to arthropathic change.  Intense radiotracer uptake surrounds the left knee arthroplasty device which likely reflects bone remodeling.  There are multiple foci of  increased uptake within both feet which are also likely arthropathic in etiology.  IMPRESSION:  1.  Increased area of radiotracer uptake within the left mid humerus is again noted and corresponds to abnormal uptake reported on the bone scan from 09/26/2004.  On the corresponding CT images there is a subtle area of increased sclerosis. Asymmetric increased uptake is noted along the superior margin of the right acetabulum. This is nonspecific and may reflect arthropathic changes.  Bone metastasis not excluded.  2.  Multifocal areas of increased uptake within the thoracic and lumbar spine as well as the right knee which are likely due to arthropathic changes. 3.  Increased uptake surrounding the left knee arthroplasty device likely reflects bone remodeling.  Original Report Authenticated By: Rosealee Albee, M.D.      IMPRESSION: Gleason's 7 adenocarcinoma the prostate. As above on biopsies he was found to have diffuse disease throughout the prostate gland . patient does have an advanced age but appears to have an excellent performance status with anticipated life expectancy of at least 10 years.  given these findings I would agree with Dr. Belva Crome recommendation for 6 months of androgen ablation along with radiation therapy.  given the patient's urinary symptoms,  I would recommend holding off starting his radiation therapy for possibly 2-3 months.  PLAN: Reevaluation in 2 months. At that time the patient will be set up to see Dr. Annabell Howells again for placement of gold fiducial markers followed by intensity modulated radiation therapy.  I spent 60 minutes minutes face to face with the patient and more than 50% of that time was spent in counseling and/or coordination of care.   ------------------------------------------------    Billie Lade, PhD, MD

## 2012-08-13 ENCOUNTER — Telehealth: Payer: Self-pay | Admitting: Internal Medicine

## 2012-08-13 ENCOUNTER — Other Ambulatory Visit: Payer: Self-pay | Admitting: Internal Medicine

## 2012-08-13 DIAGNOSIS — C61 Malignant neoplasm of prostate: Secondary | ICD-10-CM

## 2012-08-13 NOTE — Telephone Encounter (Signed)
Pt called and is req to get a referral to Urologist. Pt req call back from [place].

## 2012-08-13 NOTE — Telephone Encounter (Signed)
Referral sent to terri-pt informed

## 2012-08-13 NOTE — Telephone Encounter (Signed)
He already goes to [facility] urology and had an apointment this month- please let him know

## 2012-08-13 NOTE — Telephone Encounter (Signed)
Caller: Heru/Patient; Patient Name: [name]; PCP: Darryll Capers; Best Callback Phone Number: [phone].  Pt reports that he has prostate cancer and urologist (Dr Annabell Howells) wants him to begin radiation. Pt states he wants a second opinion and pt requesting a referral for out of town urologist.  He also wants to know what information he should take to new urologist.

## 2012-08-13 NOTE — Telephone Encounter (Signed)
Pt said that he would rather not go to [facility]. Req to go to [facility] or [facility], etc. Needs second opinion.

## 2012-08-24 ENCOUNTER — Other Ambulatory Visit: Payer: Self-pay | Admitting: Urology

## 2012-09-17 ENCOUNTER — Other Ambulatory Visit: Payer: Self-pay | Admitting: *Deleted

## 2012-09-17 MED ORDER — CELECOXIB 200 MG PO CAPS: 200.0000 mg | ORAL_CAPSULE | Freq: Every day | ORAL | Status: DC

## 2012-09-22 NOTE — Addendum Note (Signed)
Encounter addended by: Delynn Flavin, RN on: 09/22/2012 11:34 AM<BR>     Documentation filed: Charges VN

## 2012-10-14 ENCOUNTER — Ambulatory Visit: Payer: Medicare Other

## 2012-10-14 ENCOUNTER — Ambulatory Visit: Payer: Medicare Other | Admitting: Radiation Oncology

## 2012-10-22 ENCOUNTER — Ambulatory Visit (INDEPENDENT_AMBULATORY_CARE_PROVIDER_SITE_OTHER): Payer: Medicare Other

## 2012-10-22 DIAGNOSIS — Z23 Encounter for immunization: Secondary | ICD-10-CM

## 2012-11-02 ENCOUNTER — Other Ambulatory Visit: Payer: Self-pay | Admitting: Internal Medicine

## 2013-01-04 ENCOUNTER — Ambulatory Visit: Payer: Medicare Other | Admitting: Internal Medicine

## 2013-01-18 ENCOUNTER — Encounter: Payer: Self-pay | Admitting: *Deleted

## 2013-03-19 ENCOUNTER — Ambulatory Visit (INDEPENDENT_AMBULATORY_CARE_PROVIDER_SITE_OTHER): Payer: Medicare Other | Admitting: Internal Medicine

## 2013-03-19 ENCOUNTER — Encounter: Payer: Self-pay | Admitting: Internal Medicine

## 2013-03-19 VITALS — BP 126/70 | HR 68 | Temp 97.5°F | Wt 150.0 lb

## 2013-03-19 DIAGNOSIS — F411 Generalized anxiety disorder: Secondary | ICD-10-CM

## 2013-03-19 DIAGNOSIS — M549 Dorsalgia, unspecified: Secondary | ICD-10-CM

## 2013-03-19 DIAGNOSIS — M545 Low back pain: Secondary | ICD-10-CM

## 2013-03-19 DIAGNOSIS — E785 Hyperlipidemia, unspecified: Secondary | ICD-10-CM

## 2013-03-19 DIAGNOSIS — R413 Other amnesia: Secondary | ICD-10-CM

## 2013-03-19 MED ORDER — ATORVASTATIN CALCIUM 10 MG PO TABS: 10.0000 mg | ORAL_TABLET | Freq: Every day | ORAL | Status: DC

## 2013-03-19 MED ORDER — CARISOPRODOL 350 MG PO TABS: 350.0000 mg | ORAL_TABLET | Freq: Four times a day (QID) | ORAL | Status: DC | PRN

## 2013-03-19 MED ORDER — SERTRALINE HCL 25 MG PO TABS: 25.0000 mg | ORAL_TABLET | Freq: Every day | ORAL | Status: DC

## 2013-03-19 MED ORDER — DIAZEPAM 5 MG PO TABS: 5.0000 mg | ORAL_TABLET | Freq: Two times a day (BID) | ORAL | Status: DC | PRN

## 2013-03-19 NOTE — Progress Notes (Signed)
Subjective:    Patient ID: [name], male    DOB: 11-10-1922, 79 y.o.   MRN: [id number]  HPI Has been treated by urology  ( TURP) and radiation therapy was recommend but went the route of  chemical sterilization with good repose ( the testosterone was low and the PSA dropped significantly The shots are given when the testosterone climbs again. Has male menopause with hot flashes and weakness   Review of Systems  Constitutional: Positive for fatigue. Negative for fever.  HENT: Negative for hearing loss, congestion, neck pain and postnasal drip.   Eyes: Negative for discharge, redness and visual disturbance.  Respiratory: Negative for cough, shortness of breath and wheezing.   Cardiovascular: Negative for leg swelling.  Gastrointestinal: Negative for abdominal pain, constipation and abdominal distention.  Genitourinary: Negative for urgency and frequency.  Musculoskeletal: Negative for joint swelling and arthralgias.  Skin: Negative for color change and rash.  Neurological: Positive for weakness. Negative for light-headedness.  Hematological: Negative for adenopathy.  Psychiatric/Behavioral: Negative for behavioral problems.   Past Medical History  Diagnosis Date  . OTH MALIG NEOPLASM SKIN OTH\T\UNSPEC PARTS FACE 02/10/2009  . HYPERLIPIDEMIA 08/19/2007  . 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 ACTINIC KERATOSIS, EAR, LEFT 06/13/2008  . PROSTATE SPECIFIC ANTIGEN, ELEVATED, CHRONIC 06/15/2010  . History of prostate cancer 2001--  MILD FORM--  NO TX---  FOLLOWED BY DR [facility]  . Nocturia   . Frequency of urination   . Urgency of urination   . BPH with urinary obstruction   . History of BPH   . Carotid stenosis, asymptomatic BILATERAL MILD ICA STENOSIS   < 50%    PER CAROTID DUPLEX  09-21-2010    History   Social History  . Marital Status: Married    Spouse Name: N/A    Number of Children: N/A  . Years of Education: N/A   Occupational History   . Not on file.   Social History Main Topics  . Smoking status: Never Smoker   . Smokeless tobacco: Never Used  . Alcohol Use: No  . Drug Use: No  . Sexually Active: Not on file   Other Topics Concern  . Not on file   Social History Narrative  . No narrative on file    Past Surgical History  Procedure Laterality Date  . Total knee arthroplasty  02-11-2006     left knee  oa  . Transurethral resection of prostate  07-29-2000    bph w/ boo  . Lumbar laminotomy and foraminotomy bilaterally  05-12-2007    L2 - 3 ;  SPONDYLOSIS W/ STENOSIS  . Cardiovascular stress test  09-20-2010  DR BENSIMHON    NORMAL NUCLEAR STUDY/ NO ISCHEMIA/ EF 68%  . Prostate biopsy  07/16/2012    Procedure: PROSTATE BIOPSY;  Surgeon: Anner Crete, MD;  Location: [facility];  Service: Urology;  Laterality: N/A;  WITH ULTRASOUND ([facility] ULTRA SOUND TECH COMING) OWER  . Transurethral resection of prostate  07/16/2012    Procedure: TRANSURETHRAL RESECTION OF THE PROSTATE (TURP);  Surgeon: Anner Crete, MD;  Location: [facility];  Service: Urology;;  with gyrus    Family History  Problem Relation Age of Onset  . Arthritis Mother   . Cancer Father   . Prostate cancer Father     No Known Allergies  Current Outpatient Prescriptions on File Prior to Visit  Medication Sig Dispense Refill  . carisoprodol (  SOMA) 350 MG tablet Take 1 tablet (350 mg total) by mouth 4 (four) times daily as needed.  50 tablet  1  . celecoxib (CELEBREX) 200 MG capsule Take 1 capsule (200 mg total) by mouth daily.  90 capsule  3  . CRESTOR 20 MG tablet TAKE 1 TABLET EVERY MONDAY AND FRIDAY NIGHT.  24 tablet  3  . degarelix (FIRMAGON) 80 MG injection Inject 80 mg into the skin every 28 (twenty-eight) days.      . Dutasteride-Tamsulosin HCl (JALYN) 0.5-0.4 MG CAPS Take by mouth daily.      . sertraline (ZOLOFT) 25 MG tablet Take 1 tablet (25 mg total) by mouth daily.  30 tablet  3  . vitamin C  (ASCORBIC ACID) 500 MG tablet Take 500 mg by mouth daily.       No current facility-administered medications on file prior to visit.    BP 126/70  Pulse 68  Temp(Src) 97.5 F (36.4 C) (Oral)  Wt 150 lb (68.04 kg)  BMI 27.43 kg/m2       Objective:   Physical Exam  Nursing note and vitals reviewed. Constitutional: He appears well-developed and well-nourished.  HENT:  Head: Normocephalic and atraumatic.  Eyes: Conjunctivae are normal. Pupils are equal, round, and reactive to light.  Neck: Normal range of motion. Neck supple.  Cardiovascular: Normal rate and regular rhythm.   Pulmonary/Chest: Effort normal and breath sounds normal.  Abdominal: Soft. Bowel sounds are normal.          Assessment & Plan:  Prostate cancer treatment  Chose chemical sterilization and this has been effective with only fatigue. Mild left hip pain and has improved with heat Stable BP Wife "drives him crazy" Valium helps sleep and treat anxiety

## 2013-04-13 ENCOUNTER — Other Ambulatory Visit: Payer: Self-pay | Admitting: *Deleted

## 2013-04-13 DIAGNOSIS — M545 Low back pain: Secondary | ICD-10-CM

## 2013-04-13 DIAGNOSIS — M549 Dorsalgia, unspecified: Secondary | ICD-10-CM

## 2013-04-13 MED ORDER — CARISOPRODOL 350 MG PO TABS: 350.0000 mg | ORAL_TABLET | Freq: Four times a day (QID) | ORAL | Status: DC | PRN

## 2013-07-07 ENCOUNTER — Ambulatory Visit: Payer: Medicare Other | Admitting: Internal Medicine

## 2013-07-12 ENCOUNTER — Ambulatory Visit: Payer: Medicare Other | Admitting: Internal Medicine

## 2013-08-18 ENCOUNTER — Other Ambulatory Visit: Payer: Self-pay | Admitting: *Deleted

## 2013-08-18 MED ORDER — ROSUVASTATIN CALCIUM 20 MG PO TABS: ORAL_TABLET | ORAL | Status: DC

## 2013-08-24 ENCOUNTER — Other Ambulatory Visit: Payer: Self-pay | Admitting: Internal Medicine

## 2013-09-01 ENCOUNTER — Telehealth: Payer: Self-pay | Admitting: Internal Medicine

## 2013-09-01 MED ORDER — ATORVASTATIN CALCIUM 10 MG PO TABS: 10.0000 mg | ORAL_TABLET | Freq: Every day | ORAL | Status: DC

## 2013-09-01 NOTE — Telephone Encounter (Signed)
Called and spoke with pt and pt states that he started with Crestor 20 mg taking it Tues and Fri and he had trouble remembering to take the medicaiton.  Pt states he is switching over to [facility] and needs a 3 month supply sent to the pharmacy.   Per Padonda ok to refill Lipitor.

## 2013-09-01 NOTE — Telephone Encounter (Signed)
PT is calling to request a refill of his atorvastatin (LIPITOR) 10 MG tablet be sent to [facility]. He would like a 3 month supply. Please assist.

## 2013-11-16 ENCOUNTER — Other Ambulatory Visit: Payer: Self-pay | Admitting: *Deleted

## 2013-11-16 DIAGNOSIS — M545 Low back pain: Secondary | ICD-10-CM

## 2013-11-16 DIAGNOSIS — M549 Dorsalgia, unspecified: Secondary | ICD-10-CM

## 2013-11-16 MED ORDER — CARISOPRODOL 350 MG PO TABS: 350.0000 mg | ORAL_TABLET | Freq: Four times a day (QID) | ORAL | Status: DC | PRN

## 2014-02-03 ENCOUNTER — Other Ambulatory Visit: Payer: Self-pay | Admitting: Family

## 2014-02-07 ENCOUNTER — Telehealth: Payer: Self-pay | Admitting: Internal Medicine

## 2014-02-07 NOTE — Telephone Encounter (Signed)
That is fine and a good idea-please change banner

## 2014-02-07 NOTE — Telephone Encounter (Signed)
Pt would like to switch to dr kim due to dr Arnoldo Morale limited schedule

## 2014-02-08 NOTE — Telephone Encounter (Signed)
Ok with me, please do not change PCP designation until seen by me for a new pt visit (Mondays at 11:15). Please let him know I do not prescribe valium long term in adult patients, but he could see a psychiatrist for this if needed.

## 2014-02-08 NOTE — Telephone Encounter (Signed)
lmom for pt to call back

## 2014-02-09 NOTE — Telephone Encounter (Signed)
Pt states his wife called and requested to change PCP, he does not want to switch providers at this time.  Pt is requesting the following:  1. New script for atorvastatin (LIPITOR) 10 MG tablet #90 w/ 3 refills sent to [facility] 2. An order for labs done to check glucose level asap as he is unable to wait until May to have this done.  He has a cpx scheduled 05/18/14.

## 2014-02-10 ENCOUNTER — Other Ambulatory Visit: Payer: Self-pay | Admitting: *Deleted

## 2014-02-10 MED ORDER — ATORVASTATIN CALCIUM 10 MG PO TABS: ORAL_TABLET | ORAL | Status: DC

## 2014-02-10 NOTE — Telephone Encounter (Signed)
lmom for pt to call back

## 2014-02-10 NOTE — Telephone Encounter (Signed)
Med refilled- ok to have pt to come for fasting b-met

## 2014-02-11 NOTE — Telephone Encounter (Signed)
Pt has been sch for 02-14-14

## 2014-02-14 ENCOUNTER — Other Ambulatory Visit (INDEPENDENT_AMBULATORY_CARE_PROVIDER_SITE_OTHER): Payer: Medicare Other

## 2014-02-14 DIAGNOSIS — Z0000 Encounter for general adult medical examination without abnormal findings: Secondary | ICD-10-CM

## 2014-02-14 LAB — BASIC METABOLIC PANEL
BUN: 29 mg/dL — ABNORMAL HIGH (ref 6–23)
CALCIUM: 9.5 mg/dL (ref 8.4–10.5)
CHLORIDE: 102 meq/L (ref 96–112)
CO2: 26 meq/L (ref 19–32)
Creatinine, Ser: 1 mg/dL (ref 0.4–1.5)
GFR: 73.82 mL/min (ref >60.00)
GLUCOSE: 86 mg/dL (ref 70–99)
Potassium: 4 mEq/L (ref 3.5–5.1)
SODIUM: 136 meq/L (ref 135–145)

## 2014-02-21 ENCOUNTER — Encounter: Payer: Self-pay | Admitting: Internal Medicine

## 2014-03-18 ENCOUNTER — Other Ambulatory Visit: Payer: Self-pay | Admitting: Urology

## 2014-03-18 DIAGNOSIS — C61 Malignant neoplasm of prostate: Secondary | ICD-10-CM

## 2014-03-28 ENCOUNTER — Telehealth: Payer: Self-pay | Admitting: Internal Medicine

## 2014-03-28 NOTE — Telephone Encounter (Signed)
Pt states his

## 2014-03-28 NOTE — Telephone Encounter (Signed)
Pt states his CELEBREX 200 MG capsule needs a prior auth.  pls advise [facility]/ pt has 3 pills left

## 2014-03-29 NOTE — Telephone Encounter (Signed)
PA for Celebrex has been denied.  I called to see if I could get a list of covered medication but it was available. If there is another medication you would like to try, I can call and request a PA.

## 2014-03-30 ENCOUNTER — Telehealth: Payer: Self-pay | Admitting: Internal Medicine

## 2014-03-30 MED ORDER — ETODOLAC 300 MG PO CAPS: 300.0000 mg | ORAL_CAPSULE | Freq: Two times a day (BID) | ORAL | Status: DC

## 2014-03-30 NOTE — Telephone Encounter (Signed)
rx sent in electronically 

## 2014-03-30 NOTE — Telephone Encounter (Signed)
Pt would like to know his lab results.  He states his BUN is high and wants to know what that means.  He said he will be home after 2 pm and to call after that time.

## 2014-03-30 NOTE — Telephone Encounter (Signed)
lodine 300 bid number [phone]  X [phone]

## 2014-03-30 NOTE — Telephone Encounter (Signed)
Left message on machine returning patient's call 

## 2014-03-30 NOTE — Telephone Encounter (Signed)
Drink more water 

## 2014-03-31 ENCOUNTER — Telehealth: Payer: Self-pay | Admitting: Internal Medicine

## 2014-03-31 NOTE — Telephone Encounter (Signed)
Spoke with patient and he is aware of lab results.  He was also inquiring about his prior authorization for Celebrex.  Samples are available for him to pick up.

## 2014-03-31 NOTE — Telephone Encounter (Signed)
Error/gd °

## 2014-04-04 ENCOUNTER — Telehealth: Payer: Self-pay

## 2014-04-04 NOTE — Telephone Encounter (Signed)
Pt said [employer] would not approve celebrex based on the reason that was sent in which was for pain . Pt said reason should be for arthritis

## 2014-04-05 NOTE — Telephone Encounter (Signed)
This pt states that the dx needs to be changed

## 2014-04-06 NOTE — Telephone Encounter (Signed)
Pt called back he is very upset that this has not been done.// [employer] need reason to be for arthritis  Pt said called this number [phone] and give them the reason

## 2014-04-08 ENCOUNTER — Telehealth: Payer: Self-pay | Admitting: Internal Medicine

## 2014-04-08 ENCOUNTER — Other Ambulatory Visit: Payer: Self-pay | Admitting: Internal Medicine

## 2014-04-08 DIAGNOSIS — M479 Spondylosis, unspecified: Secondary | ICD-10-CM

## 2014-04-08 NOTE — Telephone Encounter (Signed)
Patient has osteo arthritis with pain and that is why he was given celebrex

## 2014-04-08 NOTE — Telephone Encounter (Signed)
Can you please update pt's diagnosis code to make osteoarthritis instead of back pain??

## 2014-04-08 NOTE — Telephone Encounter (Signed)
PA for CELEBREX 200 MG capsule, 90 per 90 days has been APPROVED 02/08/14 - 04/09/15.  Can you please update the Diagnosis to reflect Osteoarthritis?

## 2014-04-08 NOTE — Telephone Encounter (Signed)
Dr. Arnoldo Morale added osteoarthritis as a dx

## 2014-04-08 NOTE — Telephone Encounter (Signed)
Dr. Jenkins added osteoarthritis as a dx 

## 2014-05-06 ENCOUNTER — Telehealth: Payer: Self-pay | Admitting: Internal Medicine

## 2014-05-06 NOTE — Telephone Encounter (Signed)
Pt has been a long time pt of dr Arnoldo Morale and would like to know if you will accept him as a patient? Pt states he also lives near your office. Will you accept? Is that ok w/ you dr Arnoldo Morale? Pt would like your approval also!

## 2014-05-06 NOTE — Telephone Encounter (Signed)
Dr Silvio Pate is a great provider

## 2014-05-08 NOTE — Telephone Encounter (Signed)
Okay with me You can set up the appt sometime soon

## 2014-05-09 NOTE — Telephone Encounter (Signed)
I left a message on patient's voice mail that he can switch to Dr.Letvak.

## 2014-05-18 ENCOUNTER — Encounter: Payer: Medicare Other | Admitting: Internal Medicine

## 2014-06-06 ENCOUNTER — Encounter (HOSPITAL_COMMUNITY)
Admission: RE | Admit: 2014-06-06 | Discharge: 2014-06-06 | Disposition: A | Discharge status: Home / Self Care | Payer: Medicare Other | Source: Ambulatory Visit | Attending: Urology | Admitting: Urology

## 2014-06-06 DIAGNOSIS — C61 Malignant neoplasm of prostate: Secondary | ICD-10-CM | POA: Insufficient documentation

## 2014-06-06 DIAGNOSIS — M949 Disorder of cartilage, unspecified: Secondary | ICD-10-CM

## 2014-06-06 DIAGNOSIS — M899 Disorder of bone, unspecified: Secondary | ICD-10-CM | POA: Insufficient documentation

## 2014-06-06 MED ORDER — TECHNETIUM TC 99M MEDRONATE IV KIT
26.0000 | PACK | Freq: Once | INTRAVENOUS | Status: AC | PRN
  Administered 2014-06-06: 26 via INTRAVENOUS

## 2014-06-21 ENCOUNTER — Other Ambulatory Visit: Payer: Self-pay | Admitting: Internal Medicine

## 2014-06-22 ENCOUNTER — Telehealth: Payer: Self-pay

## 2014-06-22 NOTE — Telephone Encounter (Signed)
Pt is requesting refill for soma to [facility]; refill request was sent by pharmacy to Dr Arnoldo Morale; pt has new pt appt scheduled with Dr Silvio Pate on 11/02/14. Transferred pts call to Dr Arnoldo Morale office to inquire about refill.

## 2014-06-24 ENCOUNTER — Telehealth: Payer: Self-pay | Admitting: Internal Medicine

## 2014-06-24 ENCOUNTER — Other Ambulatory Visit: Payer: Self-pay | Admitting: *Deleted

## 2014-06-24 MED ORDER — CARISOPRODOL 350 MG PO TABS: 350.0000 mg | ORAL_TABLET | Freq: Four times a day (QID) | ORAL | Status: DC | PRN

## 2014-06-24 NOTE — Telephone Encounter (Signed)
Pt has an upcoming appt with Dr. Silvio Pate in November.  Left a message making pt aware rx sent to pharmacy.

## 2014-06-24 NOTE — Telephone Encounter (Signed)
Ok per Dr. Arnoldo Morale to fill x 3 refills. Pt needs to est with new pcp.

## 2014-06-24 NOTE — Telephone Encounter (Signed)
Pt needs refill on carisoprodol 350 mg #100 with  3 refills sent to [facility]

## 2014-06-27 ENCOUNTER — Telehealth: Payer: Self-pay | Admitting: Internal Medicine

## 2014-06-27 MED ORDER — ATORVASTATIN CALCIUM 10 MG PO TABS: ORAL_TABLET | ORAL | Status: DC

## 2014-06-27 NOTE — Telephone Encounter (Signed)
Rx sent to pharmacy.  Pt is establishing with Dr. Silvio Pate in November 2015.

## 2014-06-27 NOTE — Telephone Encounter (Signed)
[facility], [address]. Is requesting re-fill on atorvastatin (LIPITOR) 10 MG tablet

## 2014-08-23 ENCOUNTER — Encounter: Payer: Self-pay | Admitting: Internal Medicine

## 2014-08-31 ENCOUNTER — Ambulatory Visit: Payer: Medicare Other | Admitting: *Deleted

## 2014-09-08 ENCOUNTER — Ambulatory Visit (INDEPENDENT_AMBULATORY_CARE_PROVIDER_SITE_OTHER): Payer: Medicare Other | Admitting: *Deleted

## 2014-09-08 DIAGNOSIS — Z23 Encounter for immunization: Secondary | ICD-10-CM

## 2014-09-27 ENCOUNTER — Other Ambulatory Visit: Payer: Self-pay | Admitting: Internal Medicine

## 2014-10-20 ENCOUNTER — Ambulatory Visit (INDEPENDENT_AMBULATORY_CARE_PROVIDER_SITE_OTHER): Payer: Medicare Other | Admitting: Internal Medicine

## 2014-10-20 ENCOUNTER — Encounter: Payer: Self-pay | Admitting: Internal Medicine

## 2014-10-20 VITALS — BP 150/70 | HR 80 | Ht 62.0 in | Wt 149.5 lb

## 2014-10-20 DIAGNOSIS — K648 Other hemorrhoids: Secondary | ICD-10-CM

## 2014-10-20 DIAGNOSIS — K644 Residual hemorrhoidal skin tags: Secondary | ICD-10-CM

## 2014-10-20 DIAGNOSIS — Z86010 Personal history of colonic polyps: Secondary | ICD-10-CM

## 2014-10-20 DIAGNOSIS — K5900 Constipation, unspecified: Secondary | ICD-10-CM

## 2014-10-20 DIAGNOSIS — K5730 Diverticulosis of large intestine without perforation or abscess without bleeding: Secondary | ICD-10-CM

## 2014-10-20 NOTE — Progress Notes (Signed)
Patient ID: BEDFORD WINSOR, male   DOB: [birth year], 80 y.o.   MRN: [id number] HPI: [name] is a 80 year old male with a past mental history of remote colon polyps, left-sided diverticulosis, prostate cancer, hyperlipidemia, hearing loss, BPH, arthritis who is seen to evaluate constipation. He is here alone today. He reports constipation and more infrequent bowel movements over the last several years. Since making the appointment with me today he has added Metamucil to his diet on a daily basis. This has improved his constipation significantly. He is now having more soft stools and feels like he is having a complete evacuation approximately every 3 days. On other days he did have smaller or "partial" bowel movements. Prior to the Metamucil with more significant constipation he was having intermittent hemorrhoidal pain. He denies bleeding. Denies abdominal pain. Good appetite with stable weight. No nausea or vomiting. No heartburn. No early satiety. Family history notable for mother who died of colorectal cancer at age 62. His last colonoscopy was in 2005 with Dr. Velora Heckler.    Past Medical History  Diagnosis Date  . OTH MALIG NEOPLASM SKIN OTH\T\UNSPEC PARTS FACE 02/10/2009  . HYPERLIPIDEMIA 08/19/2007  . 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 ACTINIC KERATOSIS, EAR, LEFT 06/13/2008  . PROSTATE SPECIFIC ANTIGEN, ELEVATED, CHRONIC 06/15/2010  . History of prostate cancer 2001--  MILD FORM--  NO TX---  FOLLOWED BY DR [facility]  . Nocturia   . Frequency of urination   . Urgency of urination   . BPH with urinary obstruction   . History of BPH   . Carotid stenosis, asymptomatic BILATERAL MILD ICA STENOSIS   < 50%    PER CAROTID DUPLEX  09-21-2010  . Arthritis   . Colon polyp   . Irritable bowel syndrome     Past Surgical History  Procedure Laterality Date  . Total knee arthroplasty  02-11-2006     left knee  oa  . Transurethral resection of prostate  07-29-2000   bph w/ boo  . Lumbar laminotomy and foraminotomy bilaterally  05-12-2007    L2 - 3 ;  SPONDYLOSIS W/ STENOSIS  . Cardiovascular stress test  09-20-2010  DR BENSIMHON    NORMAL NUCLEAR STUDY/ NO ISCHEMIA/ EF 68%  . Prostate biopsy  07/16/2012    Procedure: PROSTATE BIOPSY;  Surgeon: Malka So, MD;  Location: [facility];  Service: Urology;  Laterality: N/A;  WITH ULTRASOUND ([facility] ULTRA SOUND TECH COMING) OWER  . Transurethral resection of prostate  07/16/2012    Procedure: TRANSURETHRAL RESECTION OF THE PROSTATE (TURP);  Surgeon: Malka So, MD;  Location: [facility];  Service: Urology;;  with gyrus    Outpatient Prescriptions Prior to Visit  Medication Sig Dispense Refill  . carisoprodol (SOMA) 350 MG tablet Take 1 tablet (350 mg total) by mouth 4 (four) times daily as needed.  100 tablet  3  . CELEBREX 200 MG capsule TAKE 1 CAPSULE DAILY  90 capsule  0  . degarelix (FIRMAGON) 80 MG injection Inject 80 mg into the skin every 28 (twenty-eight) days.      . diazepam (VALIUM) 5 MG tablet Take 1 tablet (5 mg total) by mouth every 12 (twelve) hours as needed for anxiety.  90 tablet  1  . etodolac (LODINE) 300 MG capsule Take 1 capsule (300 mg total) by mouth 2 (two) times daily.  180 capsule  1  . rosuvastatin (CRESTOR) 20 MG tablet Take 1  every Monday and Friday night  30 tablet  3  . atorvastatin (LIPITOR) 10 MG tablet TAKE 1 TABLET DAILY  90 tablet  1  . sertraline (ZOLOFT) 25 MG tablet Take 1 tablet (25 mg total) by mouth daily.  30 tablet  3  . vitamin C (ASCORBIC ACID) 500 MG tablet Take 500 mg by mouth daily.       No facility-administered medications prior to visit.    No Known Allergies  Family History  Problem Relation Age of Onset  . Arthritis Mother   . Cancer Father   . Prostate cancer Father   . Colon cancer Mother     Died at age 60  . Colon polyps Neg Hx     History  Substance Use Topics  . Smoking status: Never Smoker    . Smokeless tobacco: Never Used  . Alcohol Use: No    ROS: As per history of present illness, otherwise negative  BP 150/70  Pulse 80  Ht 5\' 2"  (1.575 m)  Wt 149 lb 8 oz (67.813 kg)  BMI 27.34 kg/m2 Constitutional: Well-developed and well-nourished. No distress. HEENT: Normocephalic and atraumatic. Oropharynx is clear and moist. No oropharyngeal exudate. Conjunctivae are normal.  No scleral icterus. Neck: Neck supple. Trachea midline. Cardiovascular: Normal rate, regular rhythm and intact distal pulses.  Pulmonary/chest: Effort normal and breath sounds normal. No wheezing, rales or rhonchi. Abdominal: Soft, nontender, nondistended. Bowel sounds active throughout. There are no masses palpable. Rectal: Small skin tags without perianal pain or fluctuance, very small external hemorrhoids nontender, bowel with formed to slightly hard brown stool without masses Extremities: no clubbing, cyanosis, or edema Neurological: Alert and oriented to person place and time. Skin: Skin is warm and dry Psychiatric: Normal mood and affect. Behavior is normal.  RELEVANT LABS AND IMAGING: CBC    Component Value Date/Time   WBC 5.6 02/11/2012 1114   RBC 4.75 02/11/2012 1114   HGB 14.8 07/16/2012 0705   HCT 43.1 02/11/2012 1114   PLT 145.0* 02/11/2012 1114   MCV 90.8 02/11/2012 1114   MCHC 33.5 02/11/2012 1114   RDW 13.1 02/11/2012 1114   LYMPHSABS 1.8 02/11/2012 1114   MONOABS 0.6 02/11/2012 1114   EOSABS 0.1 02/11/2012 1114   BASOSABS 0.0 02/11/2012 1114    CMP     Component Value Date/Time   NA 136 02/14/2014 1022   K 4.0 02/14/2014 1022   CL 102 02/14/2014 1022   CO2 26 02/14/2014 1022   GLUCOSE 86 02/14/2014 1022   GLUCOSE 88 01/01/2007 1309   BUN 29* 02/14/2014 1022   CREATININE 1.0 02/14/2014 1022   CALCIUM 9.5 02/14/2014 1022   PROT 6.7 02/11/2012 1114   ALBUMIN 4.2 02/11/2012 1114   AST 20 02/11/2012 1114   ALT 19 02/11/2012 1114   ALKPHOS 67 02/11/2012 1114   BILITOT 0.9 02/11/2012 1114    GFRNONAA 68.18 06/08/2010 1010   Colonoscopy from 2005 reviewed including with the patient.   ASSESSMENT/PLAN: 80 year old male with a past mental history of remote colon polyps, left-sided diverticulosis, prostate cancer, hyperlipidemia, hearing loss, BPH, arthritis who is seen to evaluate constipation.   1. Constipation/diverticulosis/external hemorrhoids with skin tags -- he instituted Metamucil therapy and has had considerable improvement in bowel habits. Constipation has resolved and he is currently happy with his current schedule of bowel movement. There are no alarm symptoms. No active hemorrhoidal disease or symptoms today and we discussed how having more regular bowel movements will likely help hemorrhoids overtime. His  diverticulosis may be partially responsible for constipation as he ages. His last colonoscopy revealed left-sided diverticulosis but no polyps. We discussed how colonoscopy for colorectal cancer screening is in general stopped around age 36 and for him I do not think this test would provide additional benefit. He understands and agrees with this recommendation. For now he will continue Metamucil daily. I asked that he let me know if constipation becomes more of an issue or if he has trouble (pain or bleeding) with his hemorrhoids. He voices understanding. Return as needed

## 2014-10-20 NOTE — Patient Instructions (Signed)
As discussed with Dr. Hilarie Fredrickson, continue your Metamucil daily for constipation.  If this does not help you or your hemorrhoids begin to cause problems, please call our office to let us know.

## 2014-10-21 ENCOUNTER — Ambulatory Visit: Payer: Medicare Other | Admitting: Internal Medicine

## 2014-11-02 ENCOUNTER — Ambulatory Visit: Payer: Medicare Other | Admitting: Internal Medicine

## 2014-11-29 ENCOUNTER — Other Ambulatory Visit: Payer: Self-pay

## 2014-11-29 NOTE — Telephone Encounter (Signed)
Error

## 2014-11-30 ENCOUNTER — Telehealth: Payer: Self-pay | Admitting: *Deleted

## 2014-11-30 NOTE — Telephone Encounter (Signed)
Faxed request for DIAZEPAM 5MG  TABLET take 1 tablet every 12 hours as needed for anxiety, not on active med list last filled 09/04/13 by Dr. Arnoldo Morale. Please advise

## 2014-12-01 MED ORDER — DIAZEPAM 5 MG PO TABS: 2.5000 mg | ORAL_TABLET | Freq: Two times a day (BID) | ORAL | Status: DC

## 2014-12-01 NOTE — Telephone Encounter (Signed)
Spoke with patient and advised results   

## 2014-12-01 NOTE — Telephone Encounter (Signed)
Pt left v/m returning a call and pt request cb on 12/02/14 at [phone] or [phone].

## 2014-12-01 NOTE — Telephone Encounter (Signed)
.  left message to have patient return my call.  

## 2014-12-01 NOTE — Telephone Encounter (Signed)
rx called into pharmacy .left message to have patient return my call.

## 2014-12-01 NOTE — Telephone Encounter (Signed)
Okay to fill 5mg  #30 x 0 This is not a generally recommended med at his age I would recommend at most 1/2 tab bid prn We can discuss this at next appt also

## 2014-12-14 ENCOUNTER — Telehealth: Payer: Self-pay | Admitting: *Deleted

## 2014-12-14 NOTE — Telephone Encounter (Signed)
Received fax from [facility] per pt asking for HCTZ 25 mg because his systolic BP has increased from the 140's to 170's. Patient hasn't been seen here yet, has appt 12/27/14 to transfer care from Dr. Arnoldo Morale. Please advise

## 2014-12-15 NOTE — Telephone Encounter (Signed)
Spoke with patient and advised results, he will discuss at his office visit

## 2014-12-15 NOTE — Telephone Encounter (Signed)
Let him know that his BPs have not been that high in the office and I think we should discuss things before starting medication. At his age, it may be better for his top BP number to be a little higher---like 150-160.  Have him bring in all measurements and if he uses his own cuff, have him bring that in also so we can compare to what we get here

## 2014-12-27 ENCOUNTER — Encounter: Payer: Self-pay | Admitting: Internal Medicine

## 2014-12-27 ENCOUNTER — Ambulatory Visit (INDEPENDENT_AMBULATORY_CARE_PROVIDER_SITE_OTHER): Payer: Medicare Other | Admitting: Internal Medicine

## 2014-12-27 VITALS — BP 140/70 | HR 66 | Temp 97.5°F | Ht 61.5 in | Wt 150.0 lb

## 2014-12-27 DIAGNOSIS — I10 Essential (primary) hypertension: Secondary | ICD-10-CM

## 2014-12-27 DIAGNOSIS — C61 Malignant neoplasm of prostate: Secondary | ICD-10-CM

## 2014-12-27 DIAGNOSIS — E785 Hyperlipidemia, unspecified: Secondary | ICD-10-CM

## 2014-12-27 DIAGNOSIS — F39 Unspecified mood [affective] disorder: Secondary | ICD-10-CM | POA: Insufficient documentation

## 2014-12-27 DIAGNOSIS — Z23 Encounter for immunization: Secondary | ICD-10-CM

## 2014-12-27 DIAGNOSIS — M47896 Other spondylosis, lumbar region: Secondary | ICD-10-CM

## 2014-12-27 DIAGNOSIS — N400 Enlarged prostate without lower urinary tract symptoms: Secondary | ICD-10-CM | POA: Insufficient documentation

## 2014-12-27 LAB — CBC WITH DIFFERENTIAL/PLATELET
BASOS PCT: 0.7 % (ref 0.0–3.0)
Basophils Absolute: 0.1 10*3/uL (ref 0.0–0.1)
EOS PCT: 1.2 % (ref 0.0–5.0)
Eosinophils Absolute: 0.1 10*3/uL (ref 0.0–0.7)
HCT: 40.7 % (ref 39.0–52.0)
Hemoglobin: 13.3 g/dL (ref 13.0–17.0)
LYMPHS ABS: 1.9 10*3/uL (ref 0.7–4.0)
Lymphocytes Relative: 25.5 % (ref 12.0–46.0)
MCHC: 32.8 g/dL (ref 30.0–36.0)
MCV: 88.7 fl (ref 78.0–100.0)
MONO ABS: 0.6 10*3/uL (ref 0.1–1.0)
MONOS PCT: 8.9 % (ref 3.0–12.0)
NEUTROS ABS: 4.7 10*3/uL (ref 1.4–7.7)
Neutrophils Relative %: 63.7 % (ref 43.0–77.0)
Platelets: 187 10*3/uL (ref 150.0–400.0)
RBC: 4.59 Mil/uL (ref 4.22–5.81)
RDW: 13.2 % (ref 11.5–15.5)
WBC: 7.3 10*3/uL (ref 4.0–10.5)

## 2014-12-27 LAB — COMPREHENSIVE METABOLIC PANEL
ALBUMIN: 4.3 g/dL (ref 3.5–5.2)
ALK PHOS: 82 U/L (ref 39–117)
ALT: 17 U/L (ref 0–53)
AST: 22 U/L (ref 0–37)
BILIRUBIN TOTAL: 0.8 mg/dL (ref 0.2–1.2)
BUN: 22 mg/dL (ref 6–23)
CO2: 28 meq/L (ref 19–32)
Calcium: 9.5 mg/dL (ref 8.4–10.5)
Chloride: 99 mEq/L (ref 96–112)
Creatinine, Ser: 1 mg/dL (ref 0.4–1.5)
GFR: 72.84 mL/min (ref >60.00)
Glucose, Bld: 88 mg/dL (ref 70–99)
Potassium: 4.2 mEq/L (ref 3.5–5.1)
Sodium: 135 mEq/L (ref 135–145)
TOTAL PROTEIN: 7.4 g/dL (ref 6.0–8.3)

## 2014-12-27 LAB — LIPID PANEL
CHOL/HDL RATIO: 4
Cholesterol: 150 mg/dL (ref 0–200)
HDL: 40.4 mg/dL (ref >39.00)
LDL CALC: 78 mg/dL (ref 0–99)
NONHDL: 109.6
Triglycerides: 160 mg/dL — ABNORMAL HIGH (ref 0.0–149.0)
VLDL: 32 mg/dL (ref 0.0–40.0)

## 2014-12-27 LAB — T4, FREE: FREE T4: 0.69 ng/dL (ref 0.60–1.60)

## 2014-12-27 MED ORDER — CELECOXIB 200 MG PO CAPS
200.0000 mg | ORAL_CAPSULE | Freq: Every day | ORAL | Status: DC
Start: 2014-12-27 — End: 2014-12-27

## 2014-12-27 MED ORDER — DIAZEPAM 2 MG PO TABS: 2.0000 mg | ORAL_TABLET | Freq: Two times a day (BID) | ORAL | Status: DC | PRN

## 2014-12-27 MED ORDER — CELECOXIB 200 MG PO CAPS
200.0000 mg | ORAL_CAPSULE | Freq: Every day | ORAL | Status: DC
Start: 2014-12-27 — End: 2015-06-28

## 2014-12-27 NOTE — Assessment & Plan Note (Signed)
Mostly anxiety and some dysthymia related to stressful life with his difficult wife Doing well on low dose diazepam without apparent side effects Will continue for now but have him try to limit and use prn only

## 2014-12-27 NOTE — Assessment & Plan Note (Signed)
Does okay on the celebrex and soma---asked him to limit the soma

## 2014-12-27 NOTE — Progress Notes (Signed)
Subjective:    Patient ID: [name], male    DOB: Dec 30, 1922, 80 y.o.   MRN: [id number]  HPI Here to establish--- Leonard Arnoldo Morale not available now  Ongoing Rx for prostate cancer On the lupron and got single shot of firmagon Now going about 9 months between shots Last PSA 2.2 in November Sees [name]  Has some concerns about his blood sugar Recently checked and was okay  BP has been high 741'O systolic at times Hasn't been taking the HCTZ Feels the BP is due to stress--- -wife is bipolar and "toxic to me" BP has been better since taking the diazepam bid regularly Didn't do well with sertraline--didn't help Does feel depressed--wife doesn't let her leave house. Not anhedonic--enjoys getting out when he can Has aide 6 hours once a week--she protests but he has kept this  Some chronic itching Hydroxyzine daily helps this--takes in evening  Chronic back problems Uses the carisoprodol 1/2 tab as needed---usually in evening also Uses the celebrex daily  Current Outpatient Prescriptions on File Prior to Visit  Medication Sig Dispense Refill  . CELEBREX 200 MG capsule TAKE 1 CAPSULE DAILY 90 capsule 0  . diazepam (VALIUM) 5 MG tablet Take 0.5 tablets (2.5 mg total) by mouth 2 (two) times daily. 30 tablet 0  . leuprolide (LUPRON) 3.75 MG injection Inject 3.75 mg into the muscle once. As needed based on PSA     No current facility-administered medications on file prior to visit.    No Known Allergies  Past Medical History  Diagnosis Date  . OTH MALIG NEOPLASM SKIN OTH\T\UNSPEC PARTS FACE 02/10/2009  . HYPERLIPIDEMIA 08/19/2007  . 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 ACTINIC KERATOSIS, EAR, LEFT 06/13/2008  . PROSTATE SPECIFIC ANTIGEN, ELEVATED, CHRONIC 06/15/2010  . History of prostate cancer 2001--  MILD FORM--  NO TX---  FOLLOWED BY [facility]  . BPH (benign prostatic hypertrophy)   . Carotid stenosis, asymptomatic BILATERAL MILD ICA STENOSIS    < 50%    PER CAROTID DUPLEX  09-21-2010  . Arthritis   . Colon polyp   . Irritable bowel syndrome     Past Surgical History  Procedure Laterality Date  . Total knee arthroplasty  02-11-2006     left knee  oa  . Transurethral resection of prostate  07-29-2000    bph w/ boo  . Lumbar laminotomy and foraminotomy bilaterally  05-12-2007    L2 - 3 ;  SPONDYLOSIS W/ STENOSIS  . Cardiovascular stress test  09-20-2010  [facility] BENSIMHON    NORMAL NUCLEAR STUDY/ NO ISCHEMIA/ EF 68%  . Prostate biopsy  07/16/2012    Procedure: PROSTATE BIOPSY;  Surgeon: Malka So, MD;  Location: [facility];  Service: Urology;  Laterality: N/A;  WITH ULTRASOUND ([facility] ULTRA SOUND TECH COMING) OWER  . Transurethral resection of prostate  07/16/2012    Procedure: TRANSURETHRAL RESECTION OF THE PROSTATE (TURP);  Surgeon: Malka So, MD;  Location: [facility];  Service: Urology;;  with gyrus    Family History  Problem Relation Age of Onset  . Arthritis Mother   . Cancer Father   . Prostate cancer Father   . Colon cancer Mother     Died at age 69  . Colon polyps Neg Hx     History   Social History  . Marital Status: Married    Spouse Name: N/A    Number of Children:  3  . Years of Education: N/A   Occupational History  . Civil engineer, contracting     retired   Social History Main Topics  . Smoking status: Never Smoker   . Smokeless tobacco: Never Used  . Alcohol Use: No  . Drug Use: No  . Sexual Activity: Not on file   Other Topics Concern  . Not on file   Social History Narrative   Has living will   Wife is health care POA   Discussed DNR --he requests and form done 12/27/14   No feeding tube if cognitively unaware   Review of Systems Sleeps okay other than nocturia every 2 hours Appetite is fine Weight stable Hasn't been exercising---[facility]    Objective:   Physical Exam  Constitutional: He appears well-developed and  well-nourished. No distress.  Neck: Normal range of motion. Neck supple. No thyromegaly present.  Cardiovascular: Normal rate, regular rhythm, normal heart sounds and intact distal pulses.  Exam reveals no gallop.   No murmur heard. Pulmonary/Chest: Effort normal and breath sounds normal. No respiratory distress. He has no wheezes. He has no rales.  Abdominal: Soft. There is no tenderness.  Musculoskeletal: He exhibits no edema or tenderness.  Lymphadenopathy:    He has no cervical adenopathy.  Skin: No rash noted. No erythema.  Psychiatric: He has a normal mood and affect. His behavior is normal.          Assessment & Plan:

## 2014-12-27 NOTE — Addendum Note (Signed)
Addended by: Despina Hidden on: 12/27/2014 03:30 PM   Modules accepted: Orders

## 2014-12-27 NOTE — Assessment & Plan Note (Signed)
Discussed primary prevention Will stop the statin 

## 2014-12-27 NOTE — Progress Notes (Signed)
Pre visit review using our clinic review tool, if applicable. No additional management support is needed unless otherwise documented below in the visit note. 

## 2014-12-27 NOTE — Assessment & Plan Note (Signed)
Has had some high readings but seems to be related to stress Will keep him off the HCTZ for now

## 2014-12-27 NOTE — Patient Instructions (Signed)
Please stop the atorvastatin Try to use the diazepam only as needed---not twice every day. Try to limit the carisoprodol also.

## 2014-12-27 NOTE — Assessment & Plan Note (Signed)
Continues on lupron as needed Recent PSA only 2.2

## 2015-01-04 ENCOUNTER — Ambulatory Visit (INDEPENDENT_AMBULATORY_CARE_PROVIDER_SITE_OTHER)
Admission: RE | Admit: 2015-01-04 | Discharge: 2015-01-04 | Disposition: A | Discharge status: Home / Self Care | Payer: Medicare Other | Source: Ambulatory Visit | Attending: Internal Medicine | Admitting: Internal Medicine

## 2015-01-04 ENCOUNTER — Encounter: Payer: Self-pay | Admitting: Internal Medicine

## 2015-01-04 ENCOUNTER — Ambulatory Visit (INDEPENDENT_AMBULATORY_CARE_PROVIDER_SITE_OTHER): Payer: Medicare Other | Admitting: Internal Medicine

## 2015-01-04 VITALS — BP 162/82 | HR 64 | Temp 98.2°F | Ht 62.0 in | Wt 154.4 lb

## 2015-01-04 DIAGNOSIS — R079 Chest pain, unspecified: Secondary | ICD-10-CM | POA: Insufficient documentation

## 2015-01-04 DIAGNOSIS — I10 Essential (primary) hypertension: Secondary | ICD-10-CM

## 2015-01-04 DIAGNOSIS — R0789 Other chest pain: Secondary | ICD-10-CM

## 2015-01-04 DIAGNOSIS — J209 Acute bronchitis, unspecified: Secondary | ICD-10-CM

## 2015-01-04 MED ORDER — HYDROCODONE-HOMATROPINE 5-1.5 MG/5ML PO SYRP: 5.0000 mL | ORAL_SOLUTION | Freq: Four times a day (QID) | ORAL | Status: DC | PRN

## 2015-01-04 MED ORDER — LEVOFLOXACIN 250 MG PO TABS: 250.0000 mg | ORAL_TABLET | Freq: Every day | ORAL | Status: DC

## 2015-01-04 NOTE — Patient Instructions (Signed)
Please take all new medication as prescribed - the antibiotic, and cough medicine  Please continue all other medications as before, and refills have been done if requested.  Please have the pharmacy call with any other refills you may need.  Please keep your appointments with your specialists as you may have planned  Please go to the XRAY Department in the Basement (go straight as you get off the elevator) for the x-ray testing  You will be contacted by phone if any changes need to be made immediately.  Otherwise, you will receive a letter about your results with an explanation, but please check with MyChart first.  Please remember to sign up for MyChart if you have not done so, as this will be important to you in the future with finding out test results, communicating by private email, and scheduling acute appointments online when needed.

## 2015-01-04 NOTE — Assessment & Plan Note (Signed)
Suspect related to the cough/msk, but cant r/o pna - for cxr

## 2015-01-04 NOTE — Progress Notes (Signed)
Subjective:    Patient ID: [name], male    DOB: Apr 12, 1922, 81 y.o.   MRN: [id number]  HPI  Here with acute onset mild to mod 2-3 days ST, HA, general weakness and malaise, with prod cough greenish sputum, but Pt denies chest pain, increased sob or doe, wheezing, orthopnea, PND, increased LE swelling, palpitations, dizziness or syncope, except for anterior mild to mod CP achy worse with cough. Past Medical History  Diagnosis Date  . OTH MALIG NEOPLASM SKIN OTH\T\UNSPEC PARTS FACE 02/10/2009  . HYPERLIPIDEMIA 08/19/2007  . 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 ACTINIC KERATOSIS, EAR, LEFT 06/13/2008  . PROSTATE SPECIFIC ANTIGEN, ELEVATED, CHRONIC 06/15/2010  . History of prostate cancer 2001--  MILD FORM--  NO TX---  FOLLOWED BY DR [facility]  . BPH (benign prostatic hypertrophy)   . Carotid stenosis, asymptomatic BILATERAL MILD ICA STENOSIS   < 50%    PER CAROTID DUPLEX  09-21-2010  . Arthritis   . Colon polyp   . Irritable bowel syndrome    Past Surgical History  Procedure Laterality Date  . Total knee arthroplasty  02-11-2006     left knee  oa  . Transurethral resection of prostate  07-29-2000    bph w/ boo  . Lumbar laminotomy and foraminotomy bilaterally  05-12-2007    L2 - 3 ;  SPONDYLOSIS W/ STENOSIS  . Cardiovascular stress test  09-20-2010  DR BENSIMHON    NORMAL NUCLEAR STUDY/ NO ISCHEMIA/ EF 68%  . Prostate biopsy  07/16/2012    Procedure: PROSTATE BIOPSY;  Surgeon: Malka So, MD;  Location[facility];  Service: Urology;  Laterality: N/A;  WITH ULTRASOUND ([facility] ULTRA SOUND TECH COMING) OWER  . Transurethral resection of prostate  07/16/2012    Procedure: TRANSURETHRAL RESECTION OF THE PROSTATE (TURP);  Surgeon: Malka So, MD;  Location: [facility];  Service: Urology;;  with gyrus    reports that he has never smoked. He has never used smokeless tobacco. He reports that he does not drink  alcohol or use illicit drugs. family history includes Arthritis in his mother; Cancer in his father; Colon cancer in his mother; Prostate cancer in his father. There is no history of Colon polyps. No Known Allergies Current Outpatient Prescriptions on File Prior to Visit  Medication Sig Dispense Refill  . carisoprodol (SOMA) 350 MG tablet Take 175-350 mg by mouth daily as needed.     . celecoxib (CELEBREX) 200 MG capsule Take 1 capsule (200 mg total) by mouth daily. 90 capsule 3  . diazepam (VALIUM) 2 MG tablet Take 1 tablet (2 mg total) by mouth 2 (two) times daily as needed for anxiety. 60 tablet 0  . hydrOXYzine (ATARAX/VISTARIL) 10 MG tablet Take 10 mg by mouth daily.    Marland Kitchen leuprolide (LUPRON) 3.75 MG injection Inject 3.75 mg into the muscle once. As needed based on PSA     No current facility-administered medications on file prior to visit.   Review of Systems  Constitutional: Negative for unusual diaphoresis or other sweats  HENT: Negative for ringing in ear Eyes: Negative for double vision or worsening visual disturbance.  Respiratory: Negative for choking and stridor.   Gastrointestinal: Negative for vomiting or other signifcant bowel change Genitourinary: Negative for hematuria or decreased urine volume.  Musculoskeletal: Negative for other MSK pain or swelling Skin: Negative for color change and worsening wound.  Neurological: Negative for tremors and numbness  other than noted  Psychiatric/Behavioral: Negative for decreased concentration or agitation other than above       Objective:   Physical Exam BP 162/82 mmHg  Pulse 64  Temp(Src) 98.2 F (36.8 C) (Oral)  Ht 5\' 2"  (1.575 m)  Wt 154 lb 6 oz (70.024 kg)  BMI 28.23 kg/m2  SpO2 95% VS noted, mild ill Constitutional: Pt appears well-developed, well-nourished.  HENT: Head: NCAT.  Right Ear: External ear normal.  Left Ear: External ear normal.  Bilat tm's with mild erythema.  Max sinus areas non tender.  Pharynx with  mild erythema, no exudate Eyes: . Pupils are equal, round, and reactive to light. Conjunctivae and EOM are normal Neck: Normal range of motion. Neck supple.  Cardiovascular: Normal rate and regular rhythm.   Pulmonary/Chest: Effort normal and breath sounds without rales or wheezing.  Neurological: Pt is alert. Not confused , motor grossly intact Skin: Skin is warm. No rash Psychiatric: Pt behavior is normal. No agitation.   BP Readings from Last 3 Encounters:  01/04/15 162/82  12/27/14 140/70  10/20/14 150/70       Assessment & Plan:

## 2015-01-04 NOTE — Assessment & Plan Note (Signed)
Mild to mod, for antibx course,  to f/u any worsening symptoms or concerns 

## 2015-01-04 NOTE — Progress Notes (Signed)
Pre visit review using our clinic review tool, if applicable. No additional management support is needed unless otherwise documented below in the visit note. 

## 2015-01-04 NOTE — Assessment & Plan Note (Signed)
Elevated more than usual for him,  BP Readings from Last 3 Encounters:  01/04/15 162/82  12/27/14 140/70  10/20/14 150/70   D/w pt - declines further med change after wife insists he has some element of white coat htn

## 2015-01-10 ENCOUNTER — Telehealth: Payer: Self-pay

## 2015-01-10 NOTE — Telephone Encounter (Signed)
Pt left v/m; pt was advised by Dr Silvio Pate to go to [facility] and pt does not location of silver sneakers and pt request cb from [facility] at [phone].

## 2015-01-10 NOTE — Telephone Encounter (Signed)
Isn't this at the [facility]?

## 2015-01-10 NOTE — Telephone Encounter (Signed)
Spoke with patient and advised results   

## 2015-01-10 NOTE — Telephone Encounter (Signed)
Yes, all [facility] have a program

## 2015-01-17 ENCOUNTER — Telehealth: Payer: Self-pay

## 2015-01-17 NOTE — Telephone Encounter (Signed)
Pt left v/m that Celebrex was sent to [facility] and [facility]. Local pharmacy tried to fill med and delayed [facility] sending rx. Pt wants [facility] to be aware cannot send rx to 2 pharmacies.

## 2015-01-17 NOTE — Telephone Encounter (Signed)
.  left message to have patient return my call.  

## 2015-01-17 NOTE — Telephone Encounter (Signed)
Not sure exactly what happened May want to review and see if we need a service recovery and discussion at next staff meeting (not sure what the request was---we often have to fill small amounts due to delay with a mail away--but not usually at the same time)

## 2015-03-09 ENCOUNTER — Telehealth: Payer: Self-pay

## 2015-03-09 NOTE — Telephone Encounter (Signed)
Pt left v/m; last yr prior auth was needed for celebrex; pt wants to know if can initiate PA for celebrex in case another PA is needed. Advised Mrs Seamans that if PA needed pharmacy will contact [facility] with PA request. Ms. Akel voiced understanding.

## 2015-03-27 ENCOUNTER — Other Ambulatory Visit: Payer: Self-pay | Admitting: Urology

## 2015-03-27 DIAGNOSIS — C61 Malignant neoplasm of prostate: Secondary | ICD-10-CM

## 2015-04-11 NOTE — Telephone Encounter (Signed)
Patient dropped off a letter from his insurance company regarding a PA on Celebrex. Form is on your desk.

## 2015-04-12 ENCOUNTER — Encounter (HOSPITAL_COMMUNITY)
Admission: RE | Admit: 2015-04-12 | Discharge: 2015-04-12 | Disposition: A | Discharge status: Home / Self Care | Payer: Medicare Other | Source: Ambulatory Visit | Attending: Urology | Admitting: Urology

## 2015-04-12 DIAGNOSIS — C61 Malignant neoplasm of prostate: Secondary | ICD-10-CM | POA: Diagnosis not present

## 2015-04-12 MED ORDER — TECHNETIUM TC 99M MEDRONATE IV KIT
26.4000 | PACK | Freq: Once | INTRAVENOUS | Status: AC | PRN
  Administered 2015-04-12: 26.4 via INTRAVENOUS

## 2015-04-12 NOTE — Telephone Encounter (Signed)
Spoke with patient and advised that we couldn't do prior auth before the refill is requested, patient understood.

## 2015-04-24 ENCOUNTER — Other Ambulatory Visit (HOSPITAL_COMMUNITY): Payer: Self-pay | Admitting: Oncology

## 2015-04-24 DIAGNOSIS — C61 Malignant neoplasm of prostate: Secondary | ICD-10-CM

## 2015-04-26 ENCOUNTER — Telehealth: Payer: Self-pay | Admitting: Internal Medicine

## 2015-04-26 NOTE — Telephone Encounter (Signed)
Patient would like to transfer to [facility] office with Dr. Vertell Novak.

## 2015-04-26 NOTE — Telephone Encounter (Signed)
That is fine with me I only saw him once

## 2015-05-02 ENCOUNTER — Ambulatory Visit (HOSPITAL_COMMUNITY): Payer: Medicare Other

## 2015-05-03 ENCOUNTER — Encounter (HOSPITAL_COMMUNITY)
Admission: RE | Admit: 2015-05-03 | Discharge: 2015-05-03 | Disposition: A | Discharge status: Home / Self Care | Payer: Medicare Other | Source: Ambulatory Visit | Attending: Oncology | Admitting: Oncology

## 2015-05-03 DIAGNOSIS — C61 Malignant neoplasm of prostate: Secondary | ICD-10-CM | POA: Insufficient documentation

## 2015-05-03 MED ORDER — FLUDEOXYGLUCOSE F - 18 (FDG) INJECTION
10.5000 | Freq: Once | INTRAVENOUS | Status: AC | PRN
  Administered 2015-05-03: 10.5 via INTRAVENOUS

## 2015-05-03 MED ORDER — FLUDEOXYGLUCOSE F - 18 (FDG) INJECTION: 10.5000 | Freq: Once | INTRAVENOUS | Status: DC | PRN

## 2015-05-15 ENCOUNTER — Encounter: Payer: Self-pay | Admitting: Radiation Oncology

## 2015-05-17 ENCOUNTER — Ambulatory Visit
Admission: RE | Admit: 2015-05-17 | Discharge: 2015-05-17 | Disposition: A | Discharge status: Home / Self Care | Payer: Medicare Other | Source: Ambulatory Visit | Attending: Radiation Oncology | Admitting: Radiation Oncology

## 2015-05-17 VITALS — BP 184/68 | HR 64 | Temp 98.5°F | Resp 12 | Wt 146.9 lb

## 2015-05-17 VITALS — BP 187/68 | HR 64 | Temp 98.5°F | Resp 12 | Wt 146.9 lb

## 2015-05-17 DIAGNOSIS — C61 Malignant neoplasm of prostate: Secondary | ICD-10-CM

## 2015-05-17 DIAGNOSIS — Z79899 Other long term (current) drug therapy: Secondary | ICD-10-CM | POA: Insufficient documentation

## 2015-05-17 DIAGNOSIS — E785 Hyperlipidemia, unspecified: Secondary | ICD-10-CM | POA: Diagnosis not present

## 2015-05-17 HISTORY — DX: Malignant neoplasm of prostate: C61

## 2015-05-17 HISTORY — DX: Secondary malignant neoplasm of bone: C79.51

## 2015-05-17 NOTE — Progress Notes (Signed)
Radiation Oncology         [phone] ________________________________  Initial Outpatient Consultation  Name: [name] MRN: [id number]  Date: 05/17/2015  DOB: Sep 06, 1922  FK:[id number] Silvio Pate, MD  Venia Carbon, MD   REFERRING PHYSICIAN: Everardo All  DIAGNOSIS: The encounter diagnosis was Malignant neoplasm of prostate.  HISTORY OF PRESENT ILLNESS::[name] is a 81 y.o. male who presents today for consultation for lesion on iliac bone. The patient was initially seen in consultation 08/12/2012 for consideration for radiation therapy to the pelvis as definitive management of his early stage prostate cancer. At that time the patient elected not to proceed with radiation therapy, only hormonal therapy. The patient has been on intermittent Lupron therapy since that time. He recently was seen by medical oncology in [facility] for consultation. The patient's been having pain in his right hip area. Patient says he had a bone scan which showed diffuse uptake changes. The patient proceeded to undergo a sodium fluoride whole body PET scan. This showed the foci of uptake to be not typical of prostate cancer. One lesion, sclerotic within the inferior right ishium did show some activity more consistent with prostate cancer but still unchanged since July 2013. Marland Kitchen Denies numbness, pain in legs. Says he has pain when sitting for long periods of time. Goes to soma therapy (deep tissue massage) that helps, getting progressively worse. Patient says heat helps with the pain. C/O pain when lying on right side in buttocks area.     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    PAST SURGICAL HISTORY: Past Surgical History  Procedure Laterality Date  . Total knee arthroplasty  02-11-2006     left knee  oa  . Transurethral resection of prostate  07-29-2000    bph w/ boo  . Lumbar laminotomy and foraminotomy bilaterally  05-12-2007    L2 - 3 ;  SPONDYLOSIS W/ STENOSIS  . Cardiovascular stress test  09-20-2010  DR BENSIMHON    NORMAL NUCLEAR STUDY/ NO ISCHEMIA/ EF 68%  . Prostate biopsy  07/16/2012    Procedure: PROSTATE BIOPSY;  Surgeon: Malka So, MD;  Location: [facility];  Service: Urology;  Laterality: N/A;  WITH ULTRASOUND ([facility] ULTRA SOUND TECH COMING) OWER  . Transurethral resection of prostate  07/16/2012    Procedure: TRANSURETHRAL RESECTION OF THE PROSTATE (TURP);  Surgeon: Malka So, MD;  Location: [facility];  Service: Urology;;  with gyrus    FAMILY HISTORY: family history includes Arthritis in his mother; Cancer in his father; Colon cancer in his mother; Prostate cancer in his father. There is no history of Colon polyps.  SOCIAL HISTORY:  reports that he has never smoked. He has never used smokeless tobacco. He reports that he does not drink alcohol or use illicit drugs.  ALLERGIES: Review of patient's allergies indicates no known allergies.  MEDICATIONS:  Current Outpatient Prescriptions  Medication Sig Dispense Refill  . carisoprodol (SOMA) 350 MG tablet Take 175-350 mg by mouth daily as needed.     Marland Kitchen  celecoxib (CELEBREX) 200 MG capsule Take 1 capsule (200 mg total) by mouth daily. 90 capsule 3  . diazepam (VALIUM) 2 MG tablet Take 1 tablet (2 mg total) by mouth 2 (two) times daily as needed for anxiety. 60 tablet 0  . hydrOXYzine (ATARAX/VISTARIL) 10 MG tablet Take 10 mg by  mouth daily.    Marland Kitchen leuprolide (LUPRON) 3.75 MG injection Inject 3.75 mg into the muscle once. As needed based on PSA     No current facility-administered medications for this encounter.    REVIEW OF SYSTEMS:  A 15 point review of systems is documented in the electronic medical record. This was obtained by the nursing staff. However, I reviewed this with the patient to discuss relevant findings and make appropriate changes.    PHYSICAL EXAM:  weight is 146 lb 14.4 oz (66.633 kg). His oral temperature is 98.5 F (36.9 C). His blood pressure is 184/68 and his pulse is 64. His respiration is 12 and oxygen saturation is 99%.   Lungs are clear. Heart has regular rate and rhythm. No palpable cervical, supraclavicular, or axillary adenopathy. Hearing aids bilaterally. Nasal cavity moist and clear. Motor strength 5 out of 5 in the proximal and distal muscle groups of the upper and lower extremities. Patient does walk with a limp.  The patient points to his pain in the right sacrum area. Palpation in this area reveals some mild tenderness. Palpation along the right iliac crest and right greater trochanter area reveals no point tenderness.    ECOG = 1    1 - Symptomatic but completely ambulatory (Restricted in physically strenuous activity but ambulatory and able to carry out work of a light or sedentary nature. For example, light housework, office work) LABORATORY DATA:  Lab Results  Component Value Date   WBC 7.3 12/27/2014   HGB 13.3 12/27/2014   HCT 40.7 12/27/2014   MCV 88.7 12/27/2014   PLT 187.0 12/27/2014   NEUTROABS 4.7 12/27/2014   Lab Results  Component Value Date   NA 135 12/27/2014   K 4.2 12/27/2014   CL 99 12/27/2014   CO2 28 12/27/2014   GLUCOSE 88 12/27/2014   CREATININE 1.0 12/27/2014   CALCIUM 9.5 12/27/2014      RADIOGRAPHY: Nm Pet Mets (nopr) Whole Body (naf)  05/03/2015   CLINICAL DATA:  Prostate cancer.  EXAM: NM PET BONE METS (NOPR) WHOLE BODY (NaF)  TECHNIQUE:  10.5 MCi F-18 FDG was injected intravenously. Full-ring PET imaging was performed from the vertex to the feet after the radiotracer. CT data was obtained and used for attenuation correction and anatomic localization.  COMPARISON:  Whole-body bone scan 04/12/2015, sodium fluoride PET scan 07/27/2012  FINDINGS: Whole-body PET bone scan: There several foci of abnormal radiotracer uptake with associated findings on these CT portion exam; however these are not changed from sodium fluoride PET scan 07/27/2012 and additionally these lesions do not have the typical pattern of prostate cancer sclerosis on the CT imaging.  For example lesion in the left humerus (image 107 of fused data set) has a central sclerotic medullary portion a surrounding lucency not changed from 2013. Lesion in the anterior acetabulum is also lucent and expansile measuring 15 mm on image 191, series 4. This is similar to 13 mm on prior. Sclerotic lesion within the right inferior pubic ramus (image 205) measures 9 mm. This lesion is more typical for prostate cancer metastasis but is unchanged from 2013. Lesion in the posterior right femur on image 221 also  is not typical of the skeletal prostate cancer metastasis.  CT findings: 6 mm pulmonary nodule right middle lobe is not seen on comparison exam. There is focus of ground-glass opacity in the left lower lobe (image 106, series 4 )which is new from prior.  There is no retroperitoneal lymphadenopathy. No central mesenteric adenopathy. No liver lesions on this noncontrast exam.  IMPRESSION: 1. There are several foci of abnormal radiotracer uptake within the skeleton however these lesions do not have typical prostate cancer pattern on the co acquired CT and are also unchanged from 07/27/2012 and therefore unlikely prostate cancer metastasis. 2. One sclerotic lesion in the inferior right ischium is typical of a prostate cancer metastasis but this lesion is also unchanged from 07/27/2012. 3. No evidence of  diffuse skeletal metastasis as suggested on comparison bone scan. 4. There is new ground-glass opacity within the left lower lobe which suggests infection or aspiration pneumonitis. 5. 6 mm nodule in the right middle lobe is not seen on comparison exam. Recommend attention on follow-up.   Electronically Signed   By: Suzy Bouchard M.D.   On: 05/03/2015 19:22      IMPRESSION: Today the patient's pain is located in the right sacrum area which is in the area separate from his sodium fluoride PET scan findings. We discussed these issues in detail. I also showed the patient's bone scan and PET scan images to the patient . At this time I would not recommend radiation therapy to the sodium fluoride positive area on his PET scan as this seems different area from his pain  PLAN: When necessary follow-up in radiation oncology. The patient will continue close follow-up with urology and medical oncology.   This document serves as a record of services personally performed by Gery Pray, MD. It was created on his behalf by Jeralene Peters, a trained medical scribe. The creation of this record is based on the scribe's personal observations and the provider's statements to them. This document has been checked and approved by the attending provider.        ------------------------------------------------  Blair Promise, PhD, MD

## 2015-05-17 NOTE — Progress Notes (Signed)
Histology and Location of Primary Cancer: prostate cancer  Sites of Visceral and Bony Metastatic Disease: inferior right ishium  Location(s) of Symptomatic Metastases: inferior right ischium  Past/Anticipated chemotherapy by medical oncology, if any: receiving depo lupron.  Possible denosumab monthly or every other month.  Pain on a scale of 0-10 is: 4 constant, aching in lower back and right hip.  As needed he is taking Somo and tylenol.   Ambulatory status? Walker? Wheelchair?: Ambulatory but has fallen 12+ times in the last year.  Will wear the yellow wrist band but doesn't want to use a wheelchair.  SAFETY ISSUES:  Prior radiation? no  Pacemaker/ICD? no  Possible current pregnancy? no  Is the patient on methotrexate? no  Current Complaints / other details:  Pt reports he is urinating frequently and multiple times (4 times) at night.  Denies blood in his urine.

## 2015-05-22 ENCOUNTER — Other Ambulatory Visit (HOSPITAL_COMMUNITY): Payer: Self-pay | Admitting: Oncology

## 2015-05-22 DIAGNOSIS — C61 Malignant neoplasm of prostate: Secondary | ICD-10-CM

## 2015-05-23 ENCOUNTER — Encounter: Payer: Self-pay | Admitting: *Deleted

## 2015-05-23 NOTE — Progress Notes (Signed)
[name] Psychosocial Distress Screening Clinical Social Work  Clinical Social Work was referred by distress screening protocol.  The patient scored a 6 on the Psychosocial Distress Thermometer which indicates moderate distress. Clinical Social Worker phoned pt to assess for distress and other psychosocial needs, as no future appointments scheduled at [facility] at this time. CSW awaits pt's return call.   ONCBCN DISTRESS SCREENING 05/17/2015  Screening Type Initial Screening  Distress experienced in past week (1-10) 6  Family Problem type Partner  Emotional problem type Adjusting to illness;Feeling hopeless  Physical Problem type Changes in urination;Skin dry/itchy  Other family problems    Clinical Social Worker follow up needed: Yes.    If yes, follow up plan:  See above [name], [place] Worker [place]  [facility] Phone: [phone] Fax: [phone]

## 2015-05-24 NOTE — Telephone Encounter (Signed)
Fine with me but would need to show up for visit prior to switching PCP and can follow up as scheduled and not early.

## 2015-05-25 ENCOUNTER — Encounter: Payer: Self-pay | Admitting: Internal Medicine

## 2015-06-05 ENCOUNTER — Ambulatory Visit (HOSPITAL_COMMUNITY)
Admission: RE | Admit: 2015-06-05 | Discharge: 2015-06-05 | Disposition: A | Discharge status: Home / Self Care | Payer: Medicare Other | Source: Ambulatory Visit | Attending: Oncology | Admitting: Oncology

## 2015-06-05 DIAGNOSIS — R102 Pelvic and perineal pain: Secondary | ICD-10-CM | POA: Diagnosis not present

## 2015-06-05 DIAGNOSIS — M899 Disorder of bone, unspecified: Secondary | ICD-10-CM | POA: Diagnosis not present

## 2015-06-05 DIAGNOSIS — M47817 Spondylosis without myelopathy or radiculopathy, lumbosacral region: Secondary | ICD-10-CM | POA: Insufficient documentation

## 2015-06-05 DIAGNOSIS — M7061 Trochanteric bursitis, right hip: Secondary | ICD-10-CM | POA: Insufficient documentation

## 2015-06-05 DIAGNOSIS — C61 Malignant neoplasm of prostate: Secondary | ICD-10-CM | POA: Diagnosis present

## 2015-06-05 LAB — POCT I-STAT CREATININE: CREATININE: 1.1 mg/dL (ref 0.61–1.24)

## 2015-06-05 MED ORDER — GADOBENATE DIMEGLUMINE 529 MG/ML IV SOLN
14.0000 mL | Freq: Once | INTRAVENOUS | Status: AC | PRN
  Administered 2015-06-05: 14 mL via INTRAVENOUS

## 2015-06-09 ENCOUNTER — Telehealth: Payer: Self-pay | Admitting: Internal Medicine

## 2015-06-09 NOTE — Telephone Encounter (Signed)
Patient is seeing Dr. Doug Sou for first time on Tuesday and he is going to need an approval for handicap parking. Do you have these forms available. Please advise.

## 2015-06-13 ENCOUNTER — Ambulatory Visit: Payer: Federal, State, Local not specified - PPO | Admitting: Internal Medicine

## 2015-06-15 ENCOUNTER — Encounter: Payer: Medicare Other | Admitting: Internal Medicine

## 2015-06-28 ENCOUNTER — Ambulatory Visit (INDEPENDENT_AMBULATORY_CARE_PROVIDER_SITE_OTHER): Payer: Medicare Other | Admitting: Internal Medicine

## 2015-06-28 ENCOUNTER — Encounter: Payer: Self-pay | Admitting: Internal Medicine

## 2015-06-28 VITALS — BP 160/70 | HR 71 | Temp 98.3°F | Resp 12 | Ht 62.0 in | Wt 149.0 lb

## 2015-06-28 DIAGNOSIS — C61 Malignant neoplasm of prostate: Secondary | ICD-10-CM

## 2015-06-28 DIAGNOSIS — I10 Essential (primary) hypertension: Secondary | ICD-10-CM

## 2015-06-28 DIAGNOSIS — Z0000 Encounter for general adult medical examination without abnormal findings: Secondary | ICD-10-CM | POA: Diagnosis not present

## 2015-06-28 MED ORDER — CELECOXIB 200 MG PO CAPS: 200.0000 mg | ORAL_CAPSULE | Freq: Every day | ORAL | Status: DC

## 2015-06-28 MED ORDER — DONEPEZIL HCL 5 MG PO TABS: 5.0000 mg | ORAL_TABLET | Freq: Every day | ORAL | Status: AC

## 2015-06-28 NOTE — Patient Instructions (Signed)
We will send in the celebrex and we will also start a new medicine for the memory. It is called aricept and you take 1 pill a day. We recommend to take with food as it can irritate the stomach.   We have filled out the parking sticker for you.  It was a pleasure to meet you today and we can see you back in 6 months to check on how the memory is doing.   Memory Compensation Strategies  1. Use "WARM" strategy.  W= write it down  A= associate it  R= repeat it  M= make a mental note  2.   You can keep a Social worker.  Use a 3-ring notebook with sections for the following: calendar, important names and phone numbers,  medications, doctors' names/phone numbers, lists/reminders, and a section to journal what you did  each day.   3.    Use a calendar to write appointments down.  4.    Write yourself a schedule for the day.  This can be placed on the calendar or in a separate section of the Memory Notebook.  Keeping a  regular schedule can help memory.  5.    Use medication organizer with sections for each day or morning/evening pills.  You may need help loading it  6.    Keep a basket, or pegboard by the door.  Place items that you need to take out with you in the basket or on the pegboard.  You may also want to  include a message board for reminders.  7.    Use sticky notes.  Place sticky notes with reminders in a place where the task is performed.  For example: " turn off the  stove" placed by the stove, "lock the door" placed on the door at eye level, " take your medications" on  the bathroom mirror or by the place where you normally take your medications.  8.    Use alarms/timers.  Use while cooking to remind yourself to check on food or as a reminder to take your medicine, or as a  reminder to make a call, or as a reminder to perform another task, etc.

## 2015-06-28 NOTE — Progress Notes (Signed)
Pre visit review using our clinic review tool, if applicable. No additional management support is needed unless otherwise documented below in the visit note. 

## 2015-06-30 ENCOUNTER — Encounter: Payer: Self-pay | Admitting: Internal Medicine

## 2015-06-30 DIAGNOSIS — Z0000 Encounter for general adult medical examination without abnormal findings: Secondary | ICD-10-CM | POA: Insufficient documentation

## 2015-06-30 NOTE — Assessment & Plan Note (Signed)
BP good off medications, will observe.

## 2015-06-30 NOTE — Progress Notes (Signed)
   Subjective:    Patient ID: [name], male    DOB: January 10, 1922, 81 y.o.   MRN: [id number]  HPI Here for medicare wellness, no new complaints. Please see A/P for status and treatment of chronic medical problems.   Diet: heart healthy Physical activity: sedentary Depression/mood screen: negative Hearing: intact to whispered voice Visual acuity: grossly normal ADLs: capable Fall risk: none Home safety: good Cognitive evaluation: intact to orientation, naming, recall and repetition EOL planning: adv directives discussed and in place  I have personally reviewed and have noted 1. The patient's medical and social history - reviewed today no changes 2. Their use of alcohol, tobacco or illicit drugs 3. Their current medications and supplements 4. The patient's functional ability including ADL's, fall risks, home safety risks and hearing or visual impairment. 5. Diet and physical activities 6. Evidence for depression or mood disorders 7. Care team reviewed and updated (available in snapshot)  Review of Systems  Constitutional: Negative for fever, activity change, appetite change and unexpected weight change.  HENT: Negative.   Eyes: Negative.   Respiratory: Negative.   Cardiovascular: Negative.   Gastrointestinal: Negative.   Musculoskeletal: Negative.   Skin: Negative.   Neurological: Negative.   Psychiatric/Behavioral: Negative.       Objective:   Physical Exam  Constitutional: He is oriented to person, place, and time. He appears well-developed and well-nourished.  HENT:  Head: Normocephalic and atraumatic.  Eyes: EOM are normal.  Neck: Normal range of motion.  Cardiovascular: Normal rate and regular rhythm.   Pulmonary/Chest: Effort normal. No respiratory distress. He has no wheezes.  Abdominal: Soft. He exhibits no distension. There is no tenderness. There is no rebound.  Musculoskeletal: He exhibits no edema.  Neurological: He is alert and oriented to person,  place, and time.  Skin: Skin is warm and dry.  Psychiatric: He has a normal mood and affect.   Filed Vitals:   06/28/15 1050  BP: 160/70  Pulse: 71  Temp: 98.3 F (36.8 C)  TempSrc: Oral  Resp: 12  Height: 5\' 2"  (1.575 m)  Weight: 149 lb (67.586 kg)  SpO2: 97%      Assessment & Plan:

## 2015-06-30 NOTE — Assessment & Plan Note (Signed)
Stage 4 with mets in the pelvis. Takes lupron therapy and has been stable for some time. Last PSA with urology stable.

## 2015-06-30 NOTE — Assessment & Plan Note (Signed)
Currently undergoing treatment for metastatic but stable prostate cancer. Has aged out of most screening. Checking labs today. Talked to him about fall prevention and home safety. Given list of screening recommendations.

## 2015-08-11 ENCOUNTER — Other Ambulatory Visit: Payer: Self-pay | Admitting: Internal Medicine

## 2015-08-11 NOTE — Telephone Encounter (Signed)
Please advise in dr Jeraldine Loots absence, thanks

## 2015-08-14 NOTE — Telephone Encounter (Signed)
Called into pharm  

## 2015-10-26 ENCOUNTER — Other Ambulatory Visit: Payer: Self-pay | Admitting: Geriatric Medicine

## 2015-10-26 ENCOUNTER — Telehealth: Payer: Self-pay | Admitting: Internal Medicine

## 2015-10-26 MED ORDER — CELECOXIB 200 MG PO CAPS: 200.0000 mg | ORAL_CAPSULE | Freq: Every day | ORAL | Status: DC

## 2015-10-26 NOTE — Telephone Encounter (Signed)
Sent to pharmacy 

## 2015-10-26 NOTE — Telephone Encounter (Signed)
Pt called in and needs refill on his celecoxib (CELEBREX) 200 MG capsule [[id number]] , he said that it is time for a PA on it.  [facility]  [phone]

## 2015-10-30 ENCOUNTER — Encounter: Payer: Self-pay | Admitting: Internal Medicine

## 2015-10-30 ENCOUNTER — Ambulatory Visit (INDEPENDENT_AMBULATORY_CARE_PROVIDER_SITE_OTHER): Payer: Medicare Other | Admitting: Internal Medicine

## 2015-10-30 ENCOUNTER — Other Ambulatory Visit (INDEPENDENT_AMBULATORY_CARE_PROVIDER_SITE_OTHER): Payer: Medicare Other

## 2015-10-30 VITALS — BP 152/90 | HR 68 | Temp 98.8°F | Resp 16 | Ht 62.0 in | Wt 146.0 lb

## 2015-10-30 DIAGNOSIS — M47896 Other spondylosis, lumbar region: Secondary | ICD-10-CM | POA: Diagnosis not present

## 2015-10-30 DIAGNOSIS — Z23 Encounter for immunization: Secondary | ICD-10-CM

## 2015-10-30 DIAGNOSIS — F39 Unspecified mood [affective] disorder: Secondary | ICD-10-CM

## 2015-10-30 DIAGNOSIS — R413 Other amnesia: Secondary | ICD-10-CM

## 2015-10-30 LAB — COMPREHENSIVE METABOLIC PANEL
ALT: 15 U/L (ref 0–53)
AST: 22 U/L (ref 0–37)
Albumin: 4.2 g/dL (ref 3.5–5.2)
Alkaline Phosphatase: 77 U/L (ref 39–117)
BILIRUBIN TOTAL: 0.4 mg/dL (ref 0.2–1.2)
BUN: 20 mg/dL (ref 6–23)
CALCIUM: 9.5 mg/dL (ref 8.4–10.5)
CO2: 25 meq/L (ref 19–32)
Chloride: 98 mEq/L (ref 96–112)
Creatinine, Ser: 0.9 mg/dL (ref 0.40–1.50)
GFR: 84 mL/min (ref >60.00)
Glucose, Bld: 106 mg/dL — ABNORMAL HIGH (ref 70–99)
Potassium: 3.8 mEq/L (ref 3.5–5.1)
Sodium: 132 mEq/L — ABNORMAL LOW (ref 135–145)
TOTAL PROTEIN: 7 g/dL (ref 6.0–8.3)

## 2015-10-30 LAB — TSH: TSH: 1.16 u[IU]/mL (ref 0.35–4.50)

## 2015-10-30 LAB — FOLATE: FOLATE: 18.6 ng/mL (ref >5.9)

## 2015-10-30 LAB — VITAMIN B12: Vitamin B-12: 702 pg/mL (ref 211–911)

## 2015-10-30 MED ORDER — CELECOXIB 200 MG PO CAPS: 200.0000 mg | ORAL_CAPSULE | Freq: Every day | ORAL | Status: DC

## 2015-10-30 MED ORDER — CARISOPRODOL 350 MG PO TABS: 175.0000 mg | ORAL_TABLET | Freq: Every day | ORAL | Status: DC | PRN

## 2015-10-30 MED ORDER — DIAZEPAM 5 MG PO TABS: 5.0000 mg | ORAL_TABLET | Freq: Two times a day (BID) | ORAL | Status: AC | PRN

## 2015-10-30 NOTE — Patient Instructions (Addendum)
We have increased the valium to 5 mg that you can take twice a day as needed for stress and anxiety.   We have given you the flu shot today. We have sent in the refills and will work on getting the celebrex approved.   We will get you in with the neurologist and are checking the blood work today.

## 2015-10-30 NOTE — Progress Notes (Signed)
Pre visit review using our clinic review tool, if applicable. No additional management support is needed unless otherwise documented below in the visit note. 

## 2015-10-31 NOTE — Assessment & Plan Note (Signed)
Okay to increase the valium to 5 mg BID while he is going through this period of separation. Talked to him about stress management and the need to take care of his own health. He will work on it and call with worsening symptoms or problems.

## 2015-10-31 NOTE — Progress Notes (Signed)
   Subjective:    Patient ID: [name], male    DOB: September 25, 1922, 81 y.o.   MRN: [id number]  HPI The patient is a 81 YO man coming in for follow up of a fall in which he hit his face on some brick. He did trip over something and was not able to catch himself. His wife fell recently and broke several ribs so he has been doing more recently. Taking tylenol which is doing okay for aches. His bruising is gone. Denies LOC or headaches.  Next concern is more stress. He is thinking about filing for separation from his wife as she is very critical and causes him a lot of stress. He is not sure that the valium is strong enough. He does not get any relief. Denies any side effects from it or drowsiness. Denies SI/HI. Does not think that he is depressed but just is struggling against his wife.   Review of Systems  Constitutional: Negative for fever, activity change, appetite change and unexpected weight change.  Respiratory: Negative.   Cardiovascular: Negative.   Gastrointestinal: Negative.   Musculoskeletal: Positive for myalgias.  Skin: Negative.   Neurological: Negative.   Psychiatric/Behavioral: Positive for dysphoric mood, decreased concentration and agitation. The patient is nervous/anxious.       Objective:   Physical Exam  Constitutional: He is oriented to person, place, and time. He appears well-developed and well-nourished.  HENT:  Head: Normocephalic and atraumatic.  No bruising on the face  Eyes: EOM are normal.  Neck: Normal range of motion.  Cardiovascular: Normal rate and regular rhythm.   Pulmonary/Chest: Effort normal. No respiratory distress. He has no wheezes.  Abdominal: Soft. He exhibits no distension. There is no tenderness. There is no rebound.  Musculoskeletal: He exhibits no edema.  Neurological: He is alert and oriented to person, place, and time.  Skin: Skin is warm and dry.  Psychiatric:  Somewhat down during the visit. Slightly anxious.    Filed Vitals:     10/30/15 1546  BP: 152/90  Pulse: 68  Temp: 98.8 F (37.1 C)  TempSrc: Oral  Resp: 16  Height: 5\' 2"  (1.575 m)  Weight: 146 lb (66.225 kg)  SpO2: 97%      Assessment & Plan:  Flu shot given at visit.

## 2015-10-31 NOTE — Assessment & Plan Note (Signed)
Some worsening with the fall but he has used tylenol successfully to help with the pain and is now recovered.

## 2015-11-06 ENCOUNTER — Telehealth: Payer: Self-pay | Admitting: Internal Medicine

## 2015-11-06 ENCOUNTER — Telehealth: Payer: Self-pay

## 2015-11-06 DIAGNOSIS — R413 Other amnesia: Secondary | ICD-10-CM

## 2015-11-06 NOTE — Telephone Encounter (Signed)
Patient is asking that you give him a call. He has a few different items to go over with you, but would not give much detail.

## 2015-11-06 NOTE — Telephone Encounter (Signed)
PA for Celebrex 200 mg capsule started via cover my meds.   Clinic questions answered and resubmitted. Waiting on response.

## 2015-11-07 ENCOUNTER — Telehealth: Payer: Self-pay

## 2015-11-07 MED ORDER — CELECOXIB 200 MG PO CAPS: 200.0000 mg | ORAL_CAPSULE | Freq: Every day | ORAL | Status: DC

## 2015-11-07 MED ORDER — CELECOXIB 200 MG PO CAPS: 200.0000 mg | ORAL_CAPSULE | Freq: Every day | ORAL | Status: AC

## 2015-11-07 NOTE — Telephone Encounter (Signed)
Were you sending this patient to Neurology?

## 2015-11-07 NOTE — Telephone Encounter (Signed)
Sent a 30 day supply to local.

## 2015-11-07 NOTE — Telephone Encounter (Signed)
PA initiated via covermymeds. LOV:[id number]

## 2015-11-07 NOTE — Telephone Encounter (Signed)
Yes.   Referral placed.  

## 2015-11-07 NOTE — Addendum Note (Signed)
Addended by: Lowella Dandy on: 11/07/2015 08:01 AM   Modules accepted: Orders

## 2015-11-07 NOTE — Telephone Encounter (Signed)
Patient called back in to inquire about neurology referral. He states that he was told at his visit about a neurologist that we would send him to. i do not see a referral in the system and dr crawfords note does not mention any neurologist. Please give him a call at the number attached. Thank you.

## 2015-11-07 NOTE — Telephone Encounter (Signed)
Left a message for patient to call back. 

## 2015-11-07 NOTE — Telephone Encounter (Signed)
PA approved. Pt contacted. RX sent to pharmacy.

## 2015-11-08 NOTE — Telephone Encounter (Signed)
I spoke with patient and let him know that the referral has been placed.

## 2015-11-13 ENCOUNTER — Telehealth: Payer: Self-pay | Admitting: Neurology

## 2015-11-13 ENCOUNTER — Telehealth: Payer: Self-pay | Admitting: Internal Medicine

## 2015-11-13 NOTE — Telephone Encounter (Signed)
Pt called to inform that he had two bad falls yesterday/ call back @ [phone]

## 2015-11-13 NOTE — Telephone Encounter (Signed)
Returned call to patient. Per Dr. Delice Lesch since patient isn't established with [place] yet advised to call his pcp about recent falls.

## 2015-11-14 ENCOUNTER — Encounter: Payer: Self-pay | Admitting: Internal Medicine

## 2015-11-14 ENCOUNTER — Ambulatory Visit (INDEPENDENT_AMBULATORY_CARE_PROVIDER_SITE_OTHER): Payer: Medicare Other | Admitting: Internal Medicine

## 2015-11-14 VITALS — BP 152/80 | HR 74 | Temp 98.4°F | Resp 16 | Ht 62.5 in | Wt 146.0 lb

## 2015-11-14 DIAGNOSIS — M159 Polyosteoarthritis, unspecified: Secondary | ICD-10-CM

## 2015-11-14 DIAGNOSIS — R296 Repeated falls: Secondary | ICD-10-CM | POA: Diagnosis not present

## 2015-11-14 DIAGNOSIS — M150 Primary generalized (osteo)arthritis: Secondary | ICD-10-CM | POA: Diagnosis not present

## 2015-11-14 NOTE — Assessment & Plan Note (Signed)
Ordered home health and given ideas about home safety. Advised to work on not doing tasks while bent over or getting onto the ground. Advised to work on exercise and PT.

## 2015-11-14 NOTE — Progress Notes (Signed)
Pre visit review using our clinic review tool, if applicable. No additional management support is needed unless otherwise documented below in the visit note. 

## 2015-11-14 NOTE — Progress Notes (Signed)
   Subjective:    Patient ID: [name], male    DOB: 06-08-1922, 81 y.o.   MRN: [id number]  HPI The patient is a 81 YO man coming in for recent falls. He was down varnishing a door and was unable to get back up. He did fall and then fell again about 2-3 hours later. He was unable to get up again. He denies head injury or LOC. He is still having the memory problems and would like to try to move up his neurology appointment.   Review of Systems  Constitutional: Negative for fever, activity change, appetite change and unexpected weight change.  Respiratory: Negative.   Cardiovascular: Negative.   Gastrointestinal: Negative.   Musculoskeletal: Positive for myalgias and back pain.  Skin: Negative.   Neurological: Negative.   Psychiatric/Behavioral: Positive for decreased concentration. The patient is nervous/anxious.       Objective:   Physical Exam  Constitutional: He appears well-developed and well-nourished.  HENT:  Head: Normocephalic and atraumatic.  Eyes: EOM are normal.  Neck: Normal range of motion.  Cardiovascular: Normal rate and regular rhythm.   Pulmonary/Chest: Effort normal. No respiratory distress. He has no wheezes.  Abdominal: Soft. He exhibits no distension. There is no tenderness. There is no rebound.  Musculoskeletal: He exhibits no edema.  Neurological: He is alert.  Skin: Skin is warm and dry.   Filed Vitals:   11/14/15 1327  BP: 152/80  Pulse: 74  Temp: 98.4 F (36.9 C)  TempSrc: Oral  Resp: 16  Height: 5' 2.5" (1.588 m)  Weight: 146 lb (66.225 kg)  SpO2: 97%      Assessment & Plan:

## 2015-11-14 NOTE — Patient Instructions (Signed)
We will get the physical therapist to come to the home.   We recommend to not drive more than 10 miles from home and only during daylight hours.   We will call to see if we can speed up the neurology appointment.   Fall Prevention in the Home  Falls can cause injuries and can affect people from all age groups. There are many simple things that you can do to make your home safe and to help prevent falls. WHAT CAN I DO ON THE OUTSIDE OF MY HOME?  Regularly repair the edges of walkways and driveways and fix any cracks.  Remove high doorway thresholds.  Trim any shrubbery on the main path into your home.  Use bright outdoor lighting.  Clear walkways of debris and clutter, including tools and rocks.  Regularly check that handrails are securely fastened and in good repair. Both sides of any steps should have handrails.  Install guardrails along the edges of any raised decks or porches.  Have leaves, snow, and ice cleared regularly.  Use sand or salt on walkways during winter months.  In the garage, clean up any spills right away, including grease or oil spills. WHAT CAN I DO IN THE BATHROOM?  Use night lights.  Install grab bars by the toilet and in the tub and shower. Do not use towel bars as grab bars.  Use non-skid mats or decals on the floor of the tub or shower.  If you need to sit down while you are in the shower, use a plastic, non-slip stool.Marland Kitchen  Keep the floor dry. Immediately clean up any water that spills on the floor.  Remove soap buildup in the tub or shower on a regular basis.  Attach bath mats securely with double-sided non-slip rug tape.  Remove throw rugs and other tripping hazards from the floor. WHAT CAN I DO IN THE BEDROOM?  Use night lights.  Make sure that a bedside light is easy to reach.  Do not use oversized bedding that drapes onto the floor.  Have a firm chair that has side arms to use for getting dressed.  Remove throw rugs and other  tripping hazards from the floor. WHAT CAN I DO IN THE KITCHEN?   Clean up any spills right away.  Avoid walking on wet floors.  Place frequently used items in easy-to-reach places.  If you need to reach for something above you, use a sturdy step stool that has a grab bar.  Keep electrical cables out of the way.  Do not use floor polish or wax that makes floors slippery. If you have to use wax, make sure that it is non-skid floor wax.  Remove throw rugs and other tripping hazards from the floor. WHAT CAN I DO IN THE STAIRWAYS?  Do not leave any items on the stairs.  Make sure that there are handrails on both sides of the stairs. Fix handrails that are broken or loose. Make sure that handrails are as long as the stairways.  Check any carpeting to make sure that it is firmly attached to the stairs. Fix any carpet that is loose or worn.  Avoid having throw rugs at the top or bottom of stairways, or secure the rugs with carpet tape to prevent them from moving.  Make sure that you have a light switch at the top of the stairs and the bottom of the stairs. If you do not have them, have them installed. WHAT ARE SOME OTHER FALL PREVENTION TIPS?  Wear closed-toe shoes that fit well and support your feet. Wear shoes that have rubber soles or low heels.  When you use a stepladder, make sure that it is completely opened and that the sides are firmly locked. Have someone hold the ladder while you are using it. Do not climb a closed stepladder.  Add color or contrast paint or tape to grab bars and handrails in your home. Place contrasting color strips on the first and last steps.  Use mobility aids as needed, such as canes, walkers, scooters, and crutches.  Turn on lights if it is dark. Replace any light bulbs that burn out.  Set up furniture so that there are clear paths. Keep the furniture in the same spot.  Fix any uneven floor surfaces.  Choose a carpet design that does not hide the  edge of steps of a stairway.  Be aware of any and all pets.  Review your medicines with your healthcare provider. Some medicines can cause dizziness or changes in blood pressure, which increase your risk of falling. Talk with your health care provider about other ways that you can decrease your risk of falls. This may include working with a physical therapist or trainer to improve your strength, balance, and endurance.   This information is not intended to replace advice given to you by your health care provider. Make sure you discuss any questions you have with your health care provider.   Document Released: 12/06/2002 Document Revised: 05/02/2015 Document Reviewed: 01/20/2015 Elsevier Interactive Patient Education [employer].

## 2015-11-22 ENCOUNTER — Telehealth: Payer: Self-pay | Admitting: Internal Medicine

## 2015-11-22 NOTE — Telephone Encounter (Signed)
Patient called stating that he got a call from you. There are no notes on file about this. He states that he will be available after 1 today.

## 2015-11-27 ENCOUNTER — Encounter (HOSPITAL_COMMUNITY): Payer: Self-pay

## 2015-11-27 ENCOUNTER — Inpatient Hospital Stay (HOSPITAL_COMMUNITY): Payer: Medicare Other

## 2015-11-27 ENCOUNTER — Inpatient Hospital Stay (HOSPITAL_COMMUNITY)
Admission: EM | Admit: 2015-11-27 | Discharge: 2015-12-06 | DRG: 026 | Disposition: A | Discharge status: Skilled Nursing Facility | Payer: Medicare Other | Attending: Neurosurgery | Admitting: Neurosurgery

## 2015-11-27 ENCOUNTER — Emergency Department (HOSPITAL_COMMUNITY): Payer: Medicare Other

## 2015-11-27 DIAGNOSIS — C7951 Secondary malignant neoplasm of bone: Secondary | ICD-10-CM | POA: Diagnosis present

## 2015-11-27 DIAGNOSIS — R40241 Glasgow coma scale score 13-15, unspecified time: Secondary | ICD-10-CM | POA: Diagnosis present

## 2015-11-27 DIAGNOSIS — H9071 Mixed conductive and sensorineural hearing loss, unilateral, right ear, with unrestricted hearing on the contralateral side: Secondary | ICD-10-CM | POA: Diagnosis present

## 2015-11-27 DIAGNOSIS — R29701 NIHSS score 1: Secondary | ICD-10-CM | POA: Diagnosis present

## 2015-11-27 DIAGNOSIS — R531 Weakness: Secondary | ICD-10-CM | POA: Diagnosis present

## 2015-11-27 DIAGNOSIS — S065X9A Traumatic subdural hemorrhage with loss of consciousness of unspecified duration, initial encounter: Secondary | ICD-10-CM

## 2015-11-27 DIAGNOSIS — G9389 Other specified disorders of brain: Secondary | ICD-10-CM | POA: Diagnosis present

## 2015-11-27 DIAGNOSIS — G451 Carotid artery syndrome (hemispheric): Secondary | ICD-10-CM | POA: Diagnosis not present

## 2015-11-27 DIAGNOSIS — I10 Essential (primary) hypertension: Secondary | ICD-10-CM | POA: Diagnosis not present

## 2015-11-27 DIAGNOSIS — W0110XA Fall on same level from slipping, tripping and stumbling with subsequent striking against unspecified object, initial encounter: Secondary | ICD-10-CM | POA: Diagnosis present

## 2015-11-27 DIAGNOSIS — S064XAA Epidural hemorrhage with loss of consciousness status unknown, initial encounter: Secondary | ICD-10-CM | POA: Diagnosis present

## 2015-11-27 DIAGNOSIS — G458 Other transient cerebral ischemic attacks and related syndromes: Secondary | ICD-10-CM

## 2015-11-27 DIAGNOSIS — S065X0A Traumatic subdural hemorrhage without loss of consciousness, initial encounter: Principal | ICD-10-CM | POA: Diagnosis present

## 2015-11-27 DIAGNOSIS — Z8546 Personal history of malignant neoplasm of prostate: Secondary | ICD-10-CM

## 2015-11-27 DIAGNOSIS — Z79818 Long term (current) use of other agents affecting estrogen receptors and estrogen levels: Secondary | ICD-10-CM | POA: Diagnosis not present

## 2015-11-27 DIAGNOSIS — M6289 Other specified disorders of muscle: Secondary | ICD-10-CM | POA: Diagnosis not present

## 2015-11-27 DIAGNOSIS — N400 Enlarged prostate without lower urinary tract symptoms: Secondary | ICD-10-CM | POA: Diagnosis present

## 2015-11-27 DIAGNOSIS — R296 Repeated falls: Secondary | ICD-10-CM | POA: Diagnosis not present

## 2015-11-27 DIAGNOSIS — G459 Transient cerebral ischemic attack, unspecified: Secondary | ICD-10-CM | POA: Diagnosis not present

## 2015-11-27 DIAGNOSIS — S065XAA Traumatic subdural hemorrhage with loss of consciousness status unknown, initial encounter: Secondary | ICD-10-CM

## 2015-11-27 DIAGNOSIS — Z79899 Other long term (current) drug therapy: Secondary | ICD-10-CM

## 2015-11-27 DIAGNOSIS — E871 Hypo-osmolality and hyponatremia: Secondary | ICD-10-CM | POA: Diagnosis present

## 2015-11-27 DIAGNOSIS — Z96652 Presence of left artificial knee joint: Secondary | ICD-10-CM | POA: Diagnosis present

## 2015-11-27 DIAGNOSIS — S064X9A Epidural hemorrhage with loss of consciousness of unspecified duration, initial encounter: Secondary | ICD-10-CM | POA: Diagnosis present

## 2015-11-27 DIAGNOSIS — R4689 Other symptoms and signs involving appearance and behavior: Secondary | ICD-10-CM

## 2015-11-27 DIAGNOSIS — E785 Hyperlipidemia, unspecified: Secondary | ICD-10-CM | POA: Diagnosis present

## 2015-11-27 DIAGNOSIS — R509 Fever, unspecified: Secondary | ICD-10-CM

## 2015-11-27 DIAGNOSIS — S069X4S Unspecified intracranial injury with loss of consciousness of 6 hours to 24 hours, sequela: Secondary | ICD-10-CM | POA: Diagnosis not present

## 2015-11-27 DIAGNOSIS — S064X0A Epidural hemorrhage without loss of consciousness, initial encounter: Secondary | ICD-10-CM | POA: Diagnosis present

## 2015-11-27 DIAGNOSIS — N390 Urinary tract infection, site not specified: Secondary | ICD-10-CM | POA: Diagnosis not present

## 2015-11-27 LAB — I-STAT CHEM 8, ED
BUN: 25 mg/dL — ABNORMAL HIGH (ref 6–20)
CALCIUM ION: 1.18 mmol/L (ref 1.13–1.30)
CREATININE: 1.1 mg/dL (ref 0.61–1.24)
Chloride: 92 mmol/L — ABNORMAL LOW (ref 101–111)
GLUCOSE: 113 mg/dL — AB (ref 65–99)
HCT: 42 % (ref 39.0–52.0)
HEMOGLOBIN: 14.3 g/dL (ref 13.0–17.0)
Potassium: 4.2 mmol/L (ref 3.5–5.1)
Sodium: 129 mmol/L — ABNORMAL LOW (ref 135–145)
TCO2: 27 mmol/L (ref 0–100)

## 2015-11-27 LAB — COMPREHENSIVE METABOLIC PANEL
ALK PHOS: 88 U/L (ref 38–126)
ALT: 19 U/L (ref 17–63)
AST: 31 U/L (ref 15–41)
Albumin: 4.4 g/dL (ref 3.5–5.0)
Anion gap: 10 (ref 5–15)
BUN: 22 mg/dL — AB (ref 6–20)
CALCIUM: 9.9 mg/dL (ref 8.9–10.3)
CHLORIDE: 91 mmol/L — AB (ref 101–111)
CO2: 26 mmol/L (ref 22–32)
CREATININE: 1.07 mg/dL (ref 0.61–1.24)
GFR, EST NON AFRICAN AMERICAN: 59 mL/min — AB (ref >60)
Glucose, Bld: 115 mg/dL — ABNORMAL HIGH (ref 65–99)
Potassium: 4.2 mmol/L (ref 3.5–5.1)
SODIUM: 127 mmol/L — AB (ref 135–145)
Total Bilirubin: 0.7 mg/dL (ref 0.3–1.2)
Total Protein: 6.9 g/dL (ref 6.5–8.1)

## 2015-11-27 LAB — RAPID URINE DRUG SCREEN, HOSP PERFORMED
AMPHETAMINES: NOT DETECTED
BARBITURATES: NOT DETECTED
BENZODIAZEPINES: POSITIVE — AB
Cocaine: NOT DETECTED
Opiates: NOT DETECTED
TETRAHYDROCANNABINOL: NOT DETECTED

## 2015-11-27 LAB — CBC
HEMATOCRIT: 38.7 % — AB (ref 39.0–52.0)
Hemoglobin: 13.4 g/dL (ref 13.0–17.0)
MCH: 29.6 pg (ref 26.0–34.0)
MCHC: 34.6 g/dL (ref 30.0–36.0)
MCV: 85.6 fL (ref 78.0–100.0)
Platelets: 182 10*3/uL (ref 150–400)
RBC: 4.52 MIL/uL (ref 4.22–5.81)
RDW: 12.9 % (ref 11.5–15.5)
WBC: 9.6 10*3/uL (ref 4.0–10.5)

## 2015-11-27 LAB — URINALYSIS, ROUTINE W REFLEX MICROSCOPIC
BILIRUBIN URINE: NEGATIVE
Glucose, UA: NEGATIVE mg/dL
KETONES UR: NEGATIVE mg/dL
Leukocytes, UA: NEGATIVE
NITRITE: NEGATIVE
PROTEIN: NEGATIVE mg/dL
SPECIFIC GRAVITY, URINE: 1.012 (ref 1.005–1.030)
pH: 7.5 (ref 5.0–8.0)

## 2015-11-27 LAB — DIFFERENTIAL
BASOS ABS: 0 10*3/uL (ref 0.0–0.1)
Basophils Relative: 0 %
Eosinophils Absolute: 0 10*3/uL (ref 0.0–0.7)
Eosinophils Relative: 0 %
LYMPHS PCT: 12 %
Lymphs Abs: 1.1 10*3/uL (ref 0.7–4.0)
MONO ABS: 0.8 10*3/uL (ref 0.1–1.0)
MONOS PCT: 9 %
NEUTROS ABS: 7.5 10*3/uL (ref 1.7–7.7)
Neutrophils Relative %: 79 %

## 2015-11-27 LAB — URINE MICROSCOPIC-ADD ON: SQUAMOUS EPITHELIAL / LPF: NONE SEEN

## 2015-11-27 LAB — PROTIME-INR
INR: 1.13 (ref 0.00–1.49)
Prothrombin Time: 14.7 seconds (ref 11.6–15.2)

## 2015-11-27 LAB — I-STAT TROPONIN, ED: Troponin i, poc: 0.04 ng/mL (ref 0.00–0.08)

## 2015-11-27 LAB — APTT: APTT: 34 s (ref 24–37)

## 2015-11-27 LAB — ETHANOL: Alcohol, Ethyl (B): <5 mg/dL (ref <5)

## 2015-11-27 MED ORDER — CALCIUM CARBONATE-VITAMIN D 500-200 MG-UNIT PO TABS
1.0000 | ORAL_TABLET | Freq: Every day | ORAL | Status: DC
  Administered 2015-11-28 – 2015-12-06 (×9): 1 via ORAL
  Filled 2015-11-27 (×11): qty 1

## 2015-11-27 MED ORDER — OXYBUTYNIN CHLORIDE ER 5 MG PO TB24
5.0000 mg | ORAL_TABLET | Freq: Every day | ORAL | Status: DC
  Administered 2015-11-28 (×2): 5 mg via ORAL
  Filled 2015-11-27 (×4): qty 1

## 2015-11-27 MED ORDER — CELECOXIB 200 MG PO CAPS
200.0000 mg | ORAL_CAPSULE | Freq: Every day | ORAL | Status: DC
  Administered 2015-11-28 – 2015-12-06 (×9): 200 mg via ORAL
  Filled 2015-11-27 (×10): qty 1

## 2015-11-27 MED ORDER — DIAZEPAM 5 MG PO TABS
5.0000 mg | ORAL_TABLET | Freq: Two times a day (BID) | ORAL | Status: DC | PRN
  Administered 2015-11-28 – 2015-12-06 (×3): 5 mg via ORAL
  Filled 2015-11-27 (×3): qty 1

## 2015-11-27 MED ORDER — STROKE: EARLY STAGES OF RECOVERY BOOK
Freq: Once | Status: AC
  Administered 2015-11-27: 22:00:00
  Filled 2015-11-27: qty 1

## 2015-11-27 MED ORDER — HYDROXYZINE HCL 10 MG PO TABS: 10.0000 mg | ORAL_TABLET | Freq: Three times a day (TID) | ORAL | Status: DC | PRN

## 2015-11-27 NOTE — ED Notes (Signed)
Pt's son is Nalu Laba. His phone number is [phone]. He states that the pt has been having increased falls recently, and frequently hits his head when he falls.

## 2015-11-27 NOTE — Consult Note (Addendum)
Referring Physician: ED    Chief Complaint: code stroke, confusion, right gaze preference, right leg weakness  HPI:                                                                                                                                         [name] is an 81 y.o. male  right handed, with a past medical history significant for prostate cancer, HLD, BPH, brought in by EMS due to acute onset of the above stated symptoms. Patient was home, sustained a fall, and was noted to be confused with gaze to the right and right leg weakness. EMS was called and his symptoms started to improve while being transported to the ED. He denies HA, vertigo, double vision , slurred speech, language or vision impairment and said that he feels back to normal. NIHSS 1 for sensory in the left. CT brain was personally reviewed and showed a small axial hyperdense material along the right frontal cerebral convexity measuring 6 mm in thickness likely representing acute subdural hemorrhage, but there is an area demonstrating a convex margin which could reflect a epidural hematoma. Slight hyper density of the left tentorium likely representing a small amount of subdural blood.. Available serologies were reviewed: etoh level <5 , PTT 34, INR 1.13, WBC 9.6, platelets 182, Na 127, Cr 1.07  Date last known well: 11/27/15 Time last known well: 6:30 pm tPA Given: no, small acute SDH/epidural.  NIHSS: 1 MRS: 0  Past Medical History  Diagnosis Date  . OTH MALIG NEOPLASM SKIN OTH\T\UNSPEC PARTS FACE 02/10/2009  . HYPERLIPIDEMIA 08/19/2007  . 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 ACTINIC KERATOSIS, EAR, LEFT 06/13/2008  . PROSTATE SPECIFIC ANTIGEN, ELEVATED, CHRONIC 06/15/2010  . History of prostate cancer 2001--  MILD FORM--  NO TX---  FOLLOWED BY DR [facility]  . BPH (benign prostatic hypertrophy)   . Carotid stenosis, asymptomatic BILATERAL MILD ICA STENOSIS   < 50%    PER CAROTID  DUPLEX  09-21-2010  . Arthritis   . Colon polyp   . Irritable bowel syndrome   . Prostate cancer metastatic to bone [facility])     Past Surgical History  Procedure Laterality Date  . Total knee arthroplasty  02-11-2006     left knee  oa  . Transurethral resection of prostate  07-29-2000    bph w/ boo  . Lumbar laminotomy and foraminotomy bilaterally  05-12-2007    L2 - 3 ;  SPONDYLOSIS W/ STENOSIS  . Cardiovascular stress test  09-20-2010  DR BENSIMHON    NORMAL NUCLEAR STUDY/ NO ISCHEMIA/ EF 68%  . Prostate biopsy  07/16/2012    Procedure: PROSTATE BIOPSY;  Surgeon: Malka So, MD;  Location: [facility];  Service: Urology;  Laterality: N/A;  WITH ULTRASOUND ([facility] ULTRA SOUND TECH COMING) OWER  . Transurethral resection of prostate  07/16/2012    Procedure: TRANSURETHRAL RESECTION OF THE PROSTATE (TURP);  Surgeon: Malka So, MD;  Location: [facility];  Service: Urology;;  with gyrus    Family History  Problem Relation Age of Onset  . Arthritis Mother   . Cancer Father   . Prostate cancer Father   . Colon cancer Mother     Died at age 92  . Colon polyps Neg Hx    Family History: no epilepsy, brain tumor, or brain aneurysm Social History:  reports that he has never smoked. He has never used smokeless tobacco. He reports that he does not drink alcohol or use illicit drugs.  Allergies: No Known Allergies  Medications:                                                                                                                           I have reviewed the patient's current medications.   ROS:                                                                                                                                       History obtained from chart review and the patient  General ROS: negative for - chills, fatigue, fever, night sweats, weight gain or weight loss Psychological ROS: negative for - behavioral disorder,  hallucinations, memory difficulties, mood swings or suicidal ideation Ophthalmic ROS: negative for - blurry vision, double vision, eye pain or loss of vision ENT ROS: negative for - epistaxis, nasal discharge, oral lesions, sore throat, tinnitus or vertigo Allergy and Immunology ROS: negative for - hives or itchy/watery eyes Hematological and Lymphatic ROS: negative for - bleeding problems, bruising or swollen lymph nodes Endocrine ROS: negative for - galactorrhea, hair pattern changes, polydipsia/polyuria or temperature intolerance Respiratory ROS: negative for - cough, hemoptysis, shortness of breath or wheezing Cardiovascular ROS: negative for - chest pain, dyspnea on exertion, edema or irregular heartbeat Gastrointestinal ROS: negative for - abdominal pain, diarrhea, hematemesis, nausea/vomiting or stool incontinence Genito-Urinary ROS: negative for - dysuria, hematuria, incontinence or urinary frequency/urgency Musculoskeletal ROS: negative for - joint swelling. Complains of generalized weakness Neurological ROS: as noted in HPI Dermatological ROS: negative for rash and skin lesion changes   Physical exam:  Constitutional: well developed, pleasant male in no apparent distress. Blood pressure 171/76, pulse 76, temperature 98.5 F (36.9 C), temperature source Oral, resp. rate  11, weight 61.961 kg (136 lb 9.6 oz), SpO2 96 %. Eyes: no jaundice or exophthalmos.  Head: normocephalic. Bruise in right temporal skull Neck: supple, no bruits, no JVD. Cardiac: no murmurs. Lungs: clear. Abdomen: soft, no tender, no mass. Extremities: no edema, clubbing, or cyanosis.  Skin: no rash  Neurologic Examination:                                                                                                      General: NAD Mental Status: Alert, oriented, thought content appropriate.  Speech fluent without evidence of aphasia.  Able to follow 3 step commands without difficulty. Cranial Nerves: II:  Discs flat bilaterally; Visual fields grossly normal, pupils equal, round, reactive to light and accommodation III,IV, VI: ptosis not present, extra-ocular motions intact bilaterally V,VII: smile symmetric, facial light touch sensation normal bilaterally VIII: hearing normal bilaterally IX,X: uvula rises symmetrically XI: bilateral shoulder shrug XII: midline tongue extension without atrophy or fasciculations  Motor: Right : Upper extremity   5/5    Left:     Upper extremity   5/5  Lower extremity   5/5     Lower extremity   5/5 Tone and bulk:normal tone throughout; no atrophy noted Sensory: Pinprick and light touch diminished in the left leg Deep Tendon Reflexes:  Right: Upper Extremity   Left: Upper extremity   biceps (C-5 to C-6) 2/4   biceps (C-5 to C-6) 2/4 tricep (C7) 2/4    triceps (C7) 2/4 Brachioradialis (C6) 2/4  Brachioradialis (C6) 2/4  Lower Extremity Lower Extremity  quadriceps (L-2 to L-4) 2/4   quadriceps (L-2 to L-4) 2/4 Achilles (S1) 2/4   Achilles (S1) 2/4  Plantars: Right: downgoing   Left: downgoing Cerebellar: normal finger-to-nose,  normal heel-to-shin test Gait:  No tested due to multiple leads    Results for orders placed or performed during the hospital encounter of 11/27/15 (from the past 48 hour(s))  Ethanol     Status: None   Collection Time: 11/27/15  8:09 PM  Result Value Ref Range   Alcohol, Ethyl (B) <5 <5 mg/dL    Comment:        LOWEST DETECTABLE LIMIT FOR SERUM ALCOHOL IS 5 mg/dL FOR MEDICAL PURPOSES ONLY   Protime-INR     Status: None   Collection Time: 11/27/15  8:09 PM  Result Value Ref Range   Prothrombin Time 14.7 11.6 - 15.2 seconds   INR 1.13 0.00 - 1.49  APTT     Status: None   Collection Time: 11/27/15  8:09 PM  Result Value Ref Range   aPTT 34 24 - 37 seconds  CBC     Status: Abnormal   Collection Time: 11/27/15  8:09 PM  Result Value Ref Range   WBC 9.6 4.0 - 10.5 K/uL   RBC 4.52 4.22 - 5.81 MIL/uL   Hemoglobin  13.4 13.0 - 17.0 g/dL   HCT 38.7 (L) 39.0 - 52.0 %   MCV 85.6 78.0 - 100.0 fL   MCH 29.6 26.0 - 34.0 pg   MCHC 34.6 30.0 -  36.0 g/dL   RDW 12.9 11.5 - 15.5 %   Platelets 182 150 - 400 K/uL  Differential     Status: None   Collection Time: 11/27/15  8:09 PM  Result Value Ref Range   Neutrophils Relative % 79 %   Neutro Abs 7.5 1.7 - 7.7 K/uL   Lymphocytes Relative 12 %   Lymphs Abs 1.1 0.7 - 4.0 K/uL   Monocytes Relative 9 %   Monocytes Absolute 0.8 0.1 - 1.0 K/uL   Eosinophils Relative 0 %   Eosinophils Absolute 0.0 0.0 - 0.7 K/uL   Basophils Relative 0 %   Basophils Absolute 0.0 0.0 - 0.1 K/uL  Comprehensive metabolic panel     Status: Abnormal   Collection Time: 11/27/15  8:09 PM  Result Value Ref Range   Sodium 127 (L) 135 - 145 mmol/L   Potassium 4.2 3.5 - 5.1 mmol/L   Chloride 91 (L) 101 - 111 mmol/L   CO2 26 22 - 32 mmol/L   Glucose, Bld 115 (H) 65 - 99 mg/dL   BUN 22 (H) 6 - 20 mg/dL   Creatinine, Ser 1.07 0.61 - 1.24 mg/dL   Calcium 9.9 8.9 - 10.3 mg/dL   Total Protein 6.9 6.5 - 8.1 g/dL   Albumin 4.4 3.5 - 5.0 g/dL   AST 31 15 - 41 U/L   ALT 19 17 - 63 U/L   Alkaline Phosphatase 88 38 - 126 U/L   Total Bilirubin 0.7 0.3 - 1.2 mg/dL   GFR calc non Af Amer 59 (L) >60 mL/min   GFR calc Af Amer >60 >60 mL/min    Comment: (NOTE) The eGFR has been calculated using the CKD EPI equation. This calculation has not been validated in all clinical situations. eGFR's persistently <60 mL/min signify possible Chronic Kidney Disease.    Anion gap 10 5 - 15  I-stat troponin, ED (not at [facility], [facility])     Status: None   Collection Time: 11/27/15  8:13 PM  Result Value Ref Range   Troponin i, poc 0.04 0.00 - 0.08 ng/mL   Comment 3            Comment: Due to the release kinetics of cTnI, a negative result within the first hours of the onset of symptoms does not rule out myocardial infarction with certainty. If myocardial infarction is still suspected, repeat the test at  appropriate intervals.   I-Stat Chem 8, ED  (not at [facility], [facility])     Status: Abnormal   Collection Time: 11/27/15  8:15 PM  Result Value Ref Range   Sodium 129 (L) 135 - 145 mmol/L   Potassium 4.2 3.5 - 5.1 mmol/L   Chloride 92 (L) 101 - 111 mmol/L   BUN 25 (H) 6 - 20 mg/dL   Creatinine, Ser 1.10 0.61 - 1.24 mg/dL   Glucose, Bld 113 (H) 65 - 99 mg/dL   Calcium, Ion 1.18 1.13 - 1.30 mmol/L   TCO2 27 0 - 100 mmol/L   Hemoglobin 14.3 13.0 - 17.0 g/dL   HCT 42.0 39.0 - 52.0 %   Ct Head Wo Contrast  11/27/2015  CLINICAL DATA:  Right-sided weakness. EXAM: CT HEAD WITHOUT CONTRAST TECHNIQUE: Contiguous axial images were obtained from the base of the skull through the vertex without intravenous contrast. COMPARISON:  PET-CT 05/03/2015 FINDINGS: There is extra-axial hyperdense material along the right frontal cerebral convexity measuring 6 mm in thickness with an area demonstrating a convex margin. There is slight  hyperdensity of the left tentorium. There is 2 mm of right-to-left midline shift. There is no evidence of a cortical-based area of acute infarction. There is generalized cerebral atrophy. There is periventricular white matter low attenuation likely secondary to microangiopathy. The ventricles and sulci are appropriate for the patient's age. The basal cisterns are patent. Visualized portions of the orbits are unremarkable. The visualized portions of the paranasal sinuses and mastoid air cells are unremarkable. Cerebrovascular atherosclerotic calcifications are noted. The osseous structures are unremarkable. There is soft tissue swelling along the right frontotemporal calvarium. IMPRESSION: 1. Extra-axial hyperdense material along the right frontal cerebral convexity measuring 6 mm in thickness likely representing acute subdural hemorrhage, but there is an area demonstrating a convex margin which could reflect a epidural hematoma. Slight hyperdensity of the left tentorium likely representing a small  amount of subdural blood. Critical Value/emergent results were called by telephone at the time of interpretation on 11/27/2015 at 8:44 pm to Dr. Aram Beecham, who verbally acknowledged these results. Electronically Signed   By: Kathreen Devoid   On: 11/27/2015 20:45     Assessment:  81 y/o with HLD and prostate cancer, brought in by EMS as a code stroke after sustaining a fall a home followed by confusion and noted right gaze preference, right leg weakness that had resolved. NIHSS 1 for sensory. CT brain a small axial hyperdense material along the right frontal cerebral convexity measuring 6 mm in thickness likely representing acute subdural hemorrhage, but there is an area demonstrating a convex margin which could reflect a epidural hematoma with slight hyper density of the left tentorium likely representing a small amount of subdural blood which don't match patient reported symptoms of right leg weakness and don't require aggressive intervention by neurosurgery at this time. Will entertain the possibility of a left brain TIA versus small infarct.  Admit to medicine. No antiplatelets. Follow up imagine in the morning. Consult neurosurgery in am or tonight if evidence of change in neuro status.  Stroke Risk Factors - age, HLD  Plan: 1. HgbA1c, fasting lipid panel 2. MRI, MRA  of the brain without contrast 3. Echocardiogram 4. Carotid dopplers 5. Prophylactic therapy-no anticoagulants or antiplatelets 6. Risk factor modification 7. Telemetry monitoring 8. Frequent neuro checks 9. PT/OT SLP  Dorian Pod ,MD [facility] [phone]  11/27/2015, 9:03 PM  ADDENDUM:  MRI brain confirms an cute right holo hemispheric subdural hematoma measuring up to 17 mm in maximal thickness with associated 7 mm of right-to-left shift. Nohydrocephalus. Trace subdural blood extending along the falx, tentorium, and overlying the left cerebral hemisphere as well. No other acute intracranial process. No  infarct. Post traumatic acute SDH, no acute infarct on MRI,  neurosurgery already consulted. Neurology will sign off.  Dorian Pod, MD

## 2015-11-27 NOTE — ED Notes (Signed)
Attempted report 

## 2015-11-27 NOTE — H&P (Signed)
[facility] History and Physical  CIPRIANO HESCOCK [id number] DOB: 1922/08/24 DOA: 11/27/2015  Referring physician: EDP PCP: Hoyt Koch, MD   Chief Complaint: R sided weakness   HPI: [name] is a 81 y.o. male who presents to the ED with R sided weakness onset acutely this evening.  Resolved on arrival to the ED.  Evaluated by neurology as code stroke, CT scan of brain shows acute SDH vs EDH.  Patient states he did fall and hit the R side of his head, but this was at least a week ago he believes (wife states she thinks this was 3-4 weeks ago).  They note that after the fall he had significant bruising on that side of his head and face that has now improved / resolved.  Review of Systems: Systems reviewed.  As above, otherwise negative  Past Medical History  Diagnosis Date  . OTH MALIG NEOPLASM SKIN OTH\T\UNSPEC PARTS FACE 02/10/2009  . HYPERLIPIDEMIA 08/19/2007  . 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 ACTINIC KERATOSIS, EAR, LEFT 06/13/2008  . PROSTATE SPECIFIC ANTIGEN, ELEVATED, CHRONIC 06/15/2010  . History of prostate cancer 2001--  MILD FORM--  NO TX---  FOLLOWED BY DR [facility]  . BPH (benign prostatic hypertrophy)   . Carotid stenosis, asymptomatic BILATERAL MILD ICA STENOSIS   < 50%    PER CAROTID DUPLEX  09-21-2010  . Arthritis   . Colon polyp   . Irritable bowel syndrome   . Prostate cancer metastatic to bone [facility])    Past Surgical History  Procedure Laterality Date  . Total knee arthroplasty  02-11-2006     left knee  oa  . Transurethral resection of prostate  07-29-2000    bph w/ boo  . Lumbar laminotomy and foraminotomy bilaterally  05-12-2007    L2 - 3 ;  SPONDYLOSIS W/ STENOSIS  . Cardiovascular stress test  09-20-2010  DR BENSIMHON    NORMAL NUCLEAR STUDY/ NO ISCHEMIA/ EF 68%  . Prostate biopsy  07/16/2012    Procedure: PROSTATE BIOPSY;  Surgeon: Malka So, MD;  Location: [facility];   Service: Urology;  Laterality: N/A;  WITH ULTRASOUND ([facility] ULTRA SOUND TECH COMING) OWER  . Transurethral resection of prostate  07/16/2012    Procedure: TRANSURETHRAL RESECTION OF THE PROSTATE (TURP);  Surgeon: Malka So, MD;  Location: [facility];  Service: Urology;;  with gyrus   Social History:  reports that he has never smoked. He has never used smokeless tobacco. He reports that he does not drink alcohol or use illicit drugs.  No Known Allergies  Family History  Problem Relation Age of Onset  . Arthritis Mother   . Cancer Father   . Prostate cancer Father   . Colon cancer Mother     Died at age 83  . Colon polyps Neg Hx      Prior to Admission medications   Medication Sig Start Date End Date Taking? Authorizing Provider  calcium-vitamin D (OSCAL WITH D) 500-200 MG-UNIT tablet Take 1 tablet by mouth daily with breakfast.   Yes Historical Provider, MD  carisoprodol (SOMA) 350 MG tablet Take 0.5-1 tablets (175-350 mg total) by mouth daily as needed. Patient taking differently: Take 175-350 mg by mouth daily as needed for muscle spasms.  10/30/15  Yes Hoyt Koch, MD  celecoxib (CELEBREX) 200 MG capsule Take 1 capsule (200 mg total) by mouth daily. 11/07/15  Yes Hoyt Koch,  MD  diazepam (VALIUM) 5 MG tablet Take 1 tablet (5 mg total) by mouth every 12 (twelve) hours as needed for anxiety. 10/30/15  Yes Hoyt Koch, MD  hydrOXYzine (ATARAX/VISTARIL) 10 MG tablet Take 10 mg by mouth 3 (three) times daily as needed for itching. Take two daily   Yes Historical Provider, MD  leuprolide (LUPRON) 3.75 MG injection Inject 3.75 mg into the muscle once. As needed based on PSA   Yes Historical Provider, MD  oxybutynin (DITROPAN-XL) 5 MG 24 hr tablet Take 5 mg by mouth at bedtime.  08/24/15  Yes Historical Provider, MD  donepezil (ARICEPT) 5 MG tablet Take 1 tablet (5 mg total) by mouth at bedtime. Patient not taking: Reported on 10/30/2015  06/28/15   Hoyt Koch, MD   Physical Exam: Filed Vitals:   11/27/15 2145 11/27/15 2200  BP: 163/76 187/95  Pulse: 82 86  Temp:    Resp: 13 15    BP 187/95 mmHg  Pulse 86  Temp(Src) 98.5 F (36.9 C) (Oral)  Resp 15  Wt 61.961 kg (136 lb 9.6 oz)  SpO2 97%  General Appearance:    Alert, oriented, no distress, appears stated age  Head:    Normocephalic, atraumatic  Eyes:    PERRL, EOMI, sclera non-icteric        Nose:   Nares without drainage or epistaxis. Mucosa, turbinates normal  Throat:   Moist mucous membranes. Oropharynx without erythema or exudate.  Neck:   Supple. No carotid bruits.  No thyromegaly.  No lymphadenopathy.   Back:     No CVA tenderness, no spinal tenderness  Lungs:     Clear to auscultation bilaterally, without wheezes, rhonchi or rales  Chest wall:    No tenderness to palpitation  Heart:    Regular rate and rhythm without murmurs, gallops, rubs  Abdomen:     Soft, non-tender, nondistended, normal bowel sounds, no organomegaly  Genitalia:    deferred  Rectal:    deferred  Extremities:   No clubbing, cyanosis or edema.  Pulses:   2+ and symmetric all extremities  Skin:   Skin color, texture, turgor normal, no rashes or lesions  Lymph nodes:   Cervical, supraclavicular, and axillary nodes normal  Neurologic:   CNII-XII intact. Normal strength, sensation and reflexes      throughout    Labs on Admission:  Basic Metabolic Panel:  Recent Labs Lab 11/27/15 2009 11/27/15 2015  NA 127* 129*  K 4.2 4.2  CL 91* 92*  CO2 26  --   GLUCOSE 115* 113*  BUN 22* 25*  CREATININE 1.07 1.10  CALCIUM 9.9  --    Liver Function Tests:  Recent Labs Lab 11/27/15 2009  AST 31  ALT 19  ALKPHOS 88  BILITOT 0.7  PROT 6.9  ALBUMIN 4.4   No results for input(s): LIPASE, AMYLASE in the last 168 hours. No results for input(s): AMMONIA in the last 168 hours. CBC:  Recent Labs Lab 11/27/15 2009 11/27/15 2015  WBC 9.6  --   NEUTROABS 7.5  --   HGB  13.4 14.3  HCT 38.7* 42.0  MCV 85.6  --   PLT 182  --    Cardiac Enzymes: No results for input(s): CKTOTAL, CKMB, CKMBINDEX, TROPONINI in the last 168 hours.  BNP (last 3 results) No results for input(s): PROBNP in the last 8760 hours. CBG: No results for input(s): GLUCAP in the last 168 hours.  Radiological Exams on Admission: Ct Head Wo  Contrast  11/27/2015  CLINICAL DATA:  Right-sided weakness. EXAM: CT HEAD WITHOUT CONTRAST TECHNIQUE: Contiguous axial images were obtained from the base of the skull through the vertex without intravenous contrast. COMPARISON:  PET-CT 05/03/2015 FINDINGS: There is extra-axial hyperdense material along the right frontal cerebral convexity measuring 6 mm in thickness with an area demonstrating a convex margin. There is slight hyperdensity of the left tentorium. There is 2 mm of right-to-left midline shift. There is no evidence of a cortical-based area of acute infarction. There is generalized cerebral atrophy. There is periventricular white matter low attenuation likely secondary to microangiopathy. The ventricles and sulci are appropriate for the patient's age. The basal cisterns are patent. Visualized portions of the orbits are unremarkable. The visualized portions of the paranasal sinuses and mastoid air cells are unremarkable. Cerebrovascular atherosclerotic calcifications are noted. The osseous structures are unremarkable. There is soft tissue swelling along the right frontotemporal calvarium. IMPRESSION: 1. Extra-axial hyperdense material along the right frontal cerebral convexity measuring 6 mm in thickness likely representing acute subdural hemorrhage, but there is an area demonstrating a convex margin which could reflect a epidural hematoma. Slight hyperdensity of the left tentorium likely representing a small amount of subdural blood. Critical Value/emergent results were called by telephone at the time of interpretation on 11/27/2015 at 8:44 pm to Dr.  Aram Beecham, who verbally acknowledged these results. Electronically Signed   By: Kathreen Devoid   On: 11/27/2015 20:45    EKG: Independently reviewed.  Assessment/Plan Principal Problem:   Epidural hematoma (HCC) Active Problems:   HTN (hypertension)   Frequent falls   Right sided weakness   1. Epidural hematoma - suspicious that this may be less acute than initially believed, no fall today per patient and wife, last fall was some time ago although he did hit right on that area of his head. 2. R sided weakness 1. Stroke work up ordered 2. MRI pending 3. Needs repeat CT in AM.    Code Status: Full  Family Communication: Wife at bedside Disposition Plan: Admit to inpatient   Time spent: 14 min  GARDNER, JARED M. [facility] Pager [phone]  If 7AM-7PM, please contact the day team taking care of the patient [email or web address] Password [facility] 11/27/2015, 10:09 PM

## 2015-11-27 NOTE — Progress Notes (Signed)
Code Stroke called on 81 y.o male, LSN 26. Patient was in his normal state of health when he became acutely confused and had a witnessed fall at home in yard. During fall Pt did hit his head.  Per EMS he he was confused with a right sided gaze at the scene. Upon arrival to [facility] Pt alert oriented x 4, no gaze preference assessed. Pertinent PMX includes HTN, hyperlipidemia, recent falls. CT completed STAT  Neurologist Dr. Aram Beecham positive for small subdural hematoma.Marland Kitchen NIHSS completed yielding 1 for sensory deficit on left side. At this time Pt is not a candidate for TPA as his symptoms are mild and he is positive for a bleed.

## 2015-11-27 NOTE — ED Notes (Signed)
Attempted Report 

## 2015-11-27 NOTE — ED Provider Notes (Signed)
CSN: [id number]     Arrival date & time 11/27/15  1958 History   First MD Initiated Contact with Patient 11/27/15 1959     Chief Complaint  Patient presents with  . Code Stroke     (Consider location/radiation/quality/duration/timing/severity/associated sxs/prior Treatment) HPI  Patient is a 81 year old male with past medical history significant for metastatic prostate cancer, who presents to the emergency department with right-sided weakness with confusion noted by his wife.  Last known normal 2 hours ago. Patient states that his weakness resolved prior to arrival. Denies headache, change in vision, dizziness, difficulty with speech, trouble swallowing, diplopia, extremity numbness or weakness. Denies recent fevers or chills. Recent change of medications. Patient states that he currently has no complaints. Does report a fall about a month ago, states he hit the right side of his head but was not evaluated at that time. Wife states that he has been at his baseline until today. Denies seizure-like activity.   Past Medical History  Diagnosis Date  . OTH MALIG NEOPLASM SKIN OTH\T\UNSPEC PARTS FACE 02/10/2009  . HYPERLIPIDEMIA 08/19/2007  . 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 ACTINIC KERATOSIS, EAR, LEFT 06/13/2008  . PROSTATE SPECIFIC ANTIGEN, ELEVATED, CHRONIC 06/15/2010  . History of prostate cancer 2001--  MILD FORM--  NO TX---  FOLLOWED BY DR [facility]  . BPH (benign prostatic hypertrophy)   . Carotid stenosis, asymptomatic BILATERAL MILD ICA STENOSIS   < 50%    PER CAROTID DUPLEX  09-21-2010  . Arthritis   . Colon polyp   . Irritable bowel syndrome   . Prostate cancer metastatic to bone [name])    Past Surgical History  Procedure Laterality Date  . Total knee arthroplasty  02-11-2006     left knee  oa  . Transurethral resection of prostate  07-29-2000    bph w/ boo  . Lumbar laminotomy and foraminotomy bilaterally  05-12-2007    L2 - 3 ;  SPONDYLOSIS W/  STENOSIS  . Cardiovascular stress test  09-20-2010  DR BENSIMHON    NORMAL NUCLEAR STUDY/ NO ISCHEMIA/ EF 68%  . Prostate biopsy  07/16/2012    Procedure: PROSTATE BIOPSY;  Surgeon: Malka So, MD;  Location: [facility];  Service: Urology;  Laterality: N/A;  WITH ULTRASOUND ([facility] ULTRA SOUND TECH COMING) OWER  . Transurethral resection of prostate  07/16/2012    Procedure: TRANSURETHRAL RESECTION OF THE PROSTATE (TURP);  Surgeon: Malka So, MD;  Location: [facility];  Service: Urology;;  with gyrus   Family History  Problem Relation Age of Onset  . Arthritis Mother   . Cancer Father   . Prostate cancer Father   . Colon cancer Mother     Died at age 51  . Colon polyps Neg Hx    Social History  Substance Use Topics  . Smoking status: Never Smoker   . Smokeless tobacco: Never Used  . Alcohol Use: No    Review of Systems  Constitutional: Negative for fever and appetite change.  HENT: Negative for congestion, facial swelling, trouble swallowing and voice change.   Eyes: Negative for visual disturbance.  Respiratory: Negative for chest tightness and shortness of breath.   Cardiovascular: Negative for chest pain and palpitations.  Gastrointestinal: Negative for nausea, vomiting, abdominal pain and blood in stool.  Genitourinary: Negative for dysuria, hematuria, flank pain and decreased urine volume.  Musculoskeletal: Negative for back pain and neck pain.  Skin: Negative for  rash and wound.  Neurological: Positive for weakness. Negative for dizziness, seizures, syncope, facial asymmetry, light-headedness, numbness and headaches.  Psychiatric/Behavioral: Positive for confusion. Negative for behavioral problems.      Allergies  Review of patient's allergies indicates no known allergies.  Home Medications   Prior to Admission medications   Medication Sig Start Date End Date Taking? Authorizing Provider  calcium-vitamin D (OSCAL WITH  D) 500-200 MG-UNIT tablet Take 1 tablet by mouth daily with breakfast.   Yes Historical Provider, MD  carisoprodol (SOMA) 350 MG tablet Take 0.5-1 tablets (175-350 mg total) by mouth daily as needed. Patient taking differently: Take 175-350 mg by mouth daily as needed for muscle spasms.  10/30/15  Yes Hoyt Koch, MD  celecoxib (CELEBREX) 200 MG capsule Take 1 capsule (200 mg total) by mouth daily. 11/07/15  Yes Hoyt Koch, MD  diazepam (VALIUM) 5 MG tablet Take 1 tablet (5 mg total) by mouth every 12 (twelve) hours as needed for anxiety. 10/30/15  Yes Hoyt Koch, MD  hydrOXYzine (ATARAX/VISTARIL) 10 MG tablet Take 10 mg by mouth 3 (three) times daily as needed for itching. Take two daily   Yes Historical Provider, MD  leuprolide (LUPRON) 3.75 MG injection Inject 3.75 mg into the muscle once. As needed based on PSA   Yes Historical Provider, MD  oxybutynin (DITROPAN-XL) 5 MG 24 hr tablet Take 5 mg by mouth at bedtime.  08/24/15  Yes Historical Provider, MD  donepezil (ARICEPT) 5 MG tablet Take 1 tablet (5 mg total) by mouth at bedtime. Patient not taking: Reported on 10/30/2015 06/28/15   Hoyt Koch, MD   BP 167/77 mmHg  Pulse 73  Temp(Src) 98.2 F (36.8 C) (Oral)  Resp 10  Ht 5\' 2"  (1.575 m)  Wt 65.3 kg  BMI 26.32 kg/m2  SpO2 98% Physical Exam  Constitutional: He is oriented to person, place, and time. He appears well-developed and well-nourished. No distress.  HENT:  Head: Atraumatic.  Mouth/Throat: Oropharynx is clear and moist.  Eyes: Conjunctivae and EOM are normal. Pupils are equal, round, and reactive to light.  Neck: Normal range of motion. Neck supple. No JVD present. No tracheal deviation present.  Cardiovascular: Normal rate, regular rhythm, normal heart sounds and intact distal pulses.   Pulmonary/Chest: Effort normal and breath sounds normal. No respiratory distress.  Abdominal: Soft. Bowel sounds are normal. He exhibits no distension.  There is no tenderness. There is no rebound and no guarding.  Musculoskeletal: Normal range of motion.  Neurological: He is alert and oriented to person, place, and time. He has normal strength and normal reflexes. He displays no tremor. No cranial nerve deficit or sensory deficit. He displays no seizure activity. Coordination normal. GCS eye subscore is 4. GCS verbal subscore is 5. GCS motor subscore is 6. He displays no Babinski's sign on the right side. He displays no Babinski's sign on the left side.  Skin: Skin is warm.  Psychiatric: He has a normal mood and affect.  Nursing note and vitals reviewed.   ED Course  Procedures (including critical care time) Labs Review Labs Reviewed  CBC - Abnormal; Notable for the following:    HCT 38.7 (*)    All other components within normal limits  COMPREHENSIVE METABOLIC PANEL - Abnormal; Notable for the following:    Sodium 127 (*)    Chloride 91 (*)    Glucose, Bld 115 (*)    BUN 22 (*)    GFR calc non Af Wyvonnia Lora  59 (*)    All other components within normal limits  URINE RAPID DRUG SCREEN, HOSP PERFORMED - Abnormal; Notable for the following:    Benzodiazepines POSITIVE (*)    All other components within normal limits  URINALYSIS, ROUTINE W REFLEX MICROSCOPIC (NOT AT [facility]) - Abnormal; Notable for the following:    APPearance CLOUDY (*)    Hgb urine dipstick TRACE (*)    All other components within normal limits  LIPID PANEL - Abnormal; Notable for the following:    Cholesterol 227 (*)    LDL Cholesterol 149 (*)    All other components within normal limits  URINE MICROSCOPIC-ADD ON - Abnormal; Notable for the following:    Bacteria, UA RARE (*)    All other components within normal limits  I-STAT CHEM 8, ED - Abnormal; Notable for the following:    Sodium 129 (*)    Chloride 92 (*)    BUN 25 (*)    Glucose, Bld 113 (*)    All other components within normal limits  ETHANOL  PROTIME-INR  APTT  DIFFERENTIAL  HEMOGLOBIN A1C  I-STAT  TROPOININ, ED    Imaging Review Ct Head Wo Contrast  11/27/2015  CLINICAL DATA:  Right-sided weakness. EXAM: CT HEAD WITHOUT CONTRAST TECHNIQUE: Contiguous axial images were obtained from the base of the skull through the vertex without intravenous contrast. COMPARISON:  PET-CT 05/03/2015 FINDINGS: There is extra-axial hyperdense material along the right frontal cerebral convexity measuring 6 mm in thickness with an area demonstrating a convex margin. There is slight hyperdensity of the left tentorium. There is 2 mm of right-to-left midline shift. There is no evidence of a cortical-based area of acute infarction. There is generalized cerebral atrophy. There is periventricular white matter low attenuation likely secondary to microangiopathy. The ventricles and sulci are appropriate for the patient's age. The basal cisterns are patent. Visualized portions of the orbits are unremarkable. The visualized portions of the paranasal sinuses and mastoid air cells are unremarkable. Cerebrovascular atherosclerotic calcifications are noted. The osseous structures are unremarkable. There is soft tissue swelling along the right frontotemporal calvarium. IMPRESSION: 1. Extra-axial hyperdense material along the right frontal cerebral convexity measuring 6 mm in thickness likely representing acute subdural hemorrhage, but there is an area demonstrating a convex margin which could reflect a epidural hematoma. Slight hyperdensity of the left tentorium likely representing a small amount of subdural blood. Critical Value/emergent results were called by telephone at the time of interpretation on 11/27/2015 at 8:44 pm to Dr. Aram Beecham, who verbally acknowledged these results. Electronically Signed   By: Kathreen Devoid   On: 11/27/2015 20:45   Mr Jodene Nam Head Wo Contrast  11/28/2015  CLINICAL DATA:  Initial evaluation for acute trauma, fall, transient right leg weakness. EXAM: MRI HEAD WITHOUT CONTRAST MRA HEAD WITHOUT CONTRAST  TECHNIQUE: Multiplanar, multiecho pulse sequences of the brain and surrounding structures were obtained without intravenous contrast. Angiographic images of the head were obtained using MRA technique without contrast. COMPARISON:  Prior CT from earlier the same day. FINDINGS: MRI HEAD FINDINGS Study is degraded by motion artifact. Diffuse prominence of the CSF containing spaces is compatible with generalized age-related cerebral atrophy. Patchy and confluent T2/FLAIR hyperintensity within the periventricular and deep white matter both cerebral hemispheres most consistent with chronic small vessel ischemic changes. No evidence for acute infarct. Normal intravascular flow voids are maintained. Acute subdural hematoma overlying the right cerebral convexity again seen, measuring up to 17 mm. Small amount of subdural blood extends along the  falx and tentorium. Blood extends around the right cerebellar hemisphere. Trace subdural blood overlies the left cerebral convexity as well. There is associated 7 mm of right-to-left shift. Partial effacement of the right lateral ventricle. Basilar cisterns remain patent. No hydrocephalus. No mass lesion identified. Craniocervical junction within normal limits. Multilevel degenerative spondylolysis present within the visualized upper cervical spine. Degenerative thickening present within the tectorial membrane. Pituitary gland within normal limits. No acute abnormality about the orbits. Sequelae of prior bilateral lens extraction. Scattered mucosal thickening within the paranasal sinuses. No mastoid effusion. Inner ear structures within normal limits. Bone marrow signal intensity normal. No scalp soft tissue abnormality. MRA HEAD FINDINGS ANTERIOR CIRCULATION: Visualized distal cervical segments of the internal carotid arteries are widely patent with antegrade flow. The petrous, cavernous, and supraclinoid segments are widely patent. A1 segments, anterior communicating artery common  anterior slurred well arteries well opacified bilaterally. M1 segments patent without stenosis or occlusion. MCA bifurcations within normal limits. Distal MCA branches symmetric and well opacified. Distal small vessel atheromatous irregularity within the MCA branches bilaterally. Vertebral arteries are patent to the vertebrobasilar junction. Posterior inferior cerebral arteries patent bilaterally. Basilar artery widely patent. Superior cerebellar arteries opacified proximally. Right P1 and P2 segments widely patent. Fetal origin of the left PCA. Left PCA itself is well opacified. No convincing evidence for aneurysm. Minimal irregularity at the origin of the ophthalmic arteries favored to be artifactual in nature related to motion. IMPRESSION: MRI HEAD IMPRESSION: 1. Acute right holo hemispheric subdural hematoma measuring up to 17 mm in maximal thickness with associated 7 mm of right-to-left shift. No hydrocephalus. 2. Trace subdural blood extending along the falx, tentorium, and overlying the left cerebral hemisphere as well. 3. No other acute intracranial process.  No infarct. 4. Atrophy with moderate chronic small vessel ischemic disease. MRA HEAD IMPRESSION: 1. Negative intracranial MRA. No large or proximal arterial branch occlusion. No correctable or high-grade stenosis. 2. Mild distal branch atheromatous irregularity within the MCA branches bilaterally. Electronically Signed   By: Jeannine Boga M.D.   On: 11/28/2015 00:28   Mr Brain Wo Contrast  11/28/2015  CLINICAL DATA:  Initial evaluation for acute trauma, fall, transient right leg weakness. EXAM: MRI HEAD WITHOUT CONTRAST MRA HEAD WITHOUT CONTRAST TECHNIQUE: Multiplanar, multiecho pulse sequences of the brain and surrounding structures were obtained without intravenous contrast. Angiographic images of the head were obtained using MRA technique without contrast. COMPARISON:  Prior CT from earlier the same day. FINDINGS: MRI HEAD FINDINGS Study  is degraded by motion artifact. Diffuse prominence of the CSF containing spaces is compatible with generalized age-related cerebral atrophy. Patchy and confluent T2/FLAIR hyperintensity within the periventricular and deep white matter both cerebral hemispheres most consistent with chronic small vessel ischemic changes. No evidence for acute infarct. Normal intravascular flow voids are maintained. Acute subdural hematoma overlying the right cerebral convexity again seen, measuring up to 17 mm. Small amount of subdural blood extends along the falx and tentorium. Blood extends around the right cerebellar hemisphere. Trace subdural blood overlies the left cerebral convexity as well. There is associated 7 mm of right-to-left shift. Partial effacement of the right lateral ventricle. Basilar cisterns remain patent. No hydrocephalus. No mass lesion identified. Craniocervical junction within normal limits. Multilevel degenerative spondylolysis present within the visualized upper cervical spine. Degenerative thickening present within the tectorial membrane. Pituitary gland within normal limits. No acute abnormality about the orbits. Sequelae of prior bilateral lens extraction. Scattered mucosal thickening within the paranasal sinuses. No mastoid effusion. Inner ear  structures within normal limits. Bone marrow signal intensity normal. No scalp soft tissue abnormality. MRA HEAD FINDINGS ANTERIOR CIRCULATION: Visualized distal cervical segments of the internal carotid arteries are widely patent with antegrade flow. The petrous, cavernous, and supraclinoid segments are widely patent. A1 segments, anterior communicating artery common anterior slurred well arteries well opacified bilaterally. M1 segments patent without stenosis or occlusion. MCA bifurcations within normal limits. Distal MCA branches symmetric and well opacified. Distal small vessel atheromatous irregularity within the MCA branches bilaterally. Vertebral arteries  are patent to the vertebrobasilar junction. Posterior inferior cerebral arteries patent bilaterally. Basilar artery widely patent. Superior cerebellar arteries opacified proximally. Right P1 and P2 segments widely patent. Fetal origin of the left PCA. Left PCA itself is well opacified. No convincing evidence for aneurysm. Minimal irregularity at the origin of the ophthalmic arteries favored to be artifactual in nature related to motion. IMPRESSION: MRI HEAD IMPRESSION: 1. Acute right holo hemispheric subdural hematoma measuring up to 17 mm in maximal thickness with associated 7 mm of right-to-left shift. No hydrocephalus. 2. Trace subdural blood extending along the falx, tentorium, and overlying the left cerebral hemisphere as well. 3. No other acute intracranial process.  No infarct. 4. Atrophy with moderate chronic small vessel ischemic disease. MRA HEAD IMPRESSION: 1. Negative intracranial MRA. No large or proximal arterial branch occlusion. No correctable or high-grade stenosis. 2. Mild distal branch atheromatous irregularity within the MCA branches bilaterally. Electronically Signed   By: Jeannine Boga M.D.   On: 11/28/2015 00:28   I have personally reviewed and evaluated these images and lab results as part of my medical decision-making.   EKG Interpretation None      MDM   Final diagnoses:  TIA (transient ischemic attack)  TIA (transient ischemic attack)  Epidural hematoma [facility])    Patient is a 81 year old male with past medical history significant for metastatic prostate cancer, who presents to the emergency department as a code stroke for confusion and left-sided weakness. On arrival to the emergency department patient's symptoms had resolved. Alert and oriented. GCS of 15. Neurology evaluated the patient on arrival of the patient, recommended code stroke work up with MRI.   Patient immediately went to CT scanner, found to have a right frontal 6 mm acute subdural hemorrhage versus  epidural hematoma. Coags within normal limits. No currently on anticoagulation.   Also found to be hyponatremic, Na127 (baseline mid 130s).  UA showed no signs of UTI. No leukocytosis. No other significant lab abnormalities.   Consultation neurosurgery, who recommended admission to hospitalist with repeat CT scan at 24 hours. Did not recommend any acute intervention at this time. Discussed the patient's case with hospitalist, will admit for continual management of confusion with repeat scan in 24 hours. Patient stable for the floor, transferred to the floor in stable condition.     Nathaniel Man, MD 11/28/15 [id number]  Debby Freiberg, MD 12/01/15 [phone]

## 2015-11-27 NOTE — ED Notes (Signed)
Pt brought from home by [facility] with stroke like symptoms. At home he had a right sided gaze and right sided weakness as well as some altered mental state.

## 2015-11-28 ENCOUNTER — Inpatient Hospital Stay (HOSPITAL_COMMUNITY): Payer: Medicare Other

## 2015-11-28 ENCOUNTER — Inpatient Hospital Stay (HOSPITAL_COMMUNITY): Payer: Medicare Other | Admitting: Anesthesiology

## 2015-11-28 ENCOUNTER — Encounter (HOSPITAL_COMMUNITY): Payer: Self-pay | Admitting: Certified Registered Nurse Anesthetist

## 2015-11-28 ENCOUNTER — Encounter (HOSPITAL_COMMUNITY)
Admission: EM | Disposition: A | Discharge status: Skilled Nursing Facility | Payer: Self-pay | Source: Home / Self Care | Attending: Neurosurgery

## 2015-11-28 DIAGNOSIS — S065XAA Traumatic subdural hemorrhage with loss of consciousness status unknown, initial encounter: Secondary | ICD-10-CM | POA: Diagnosis present

## 2015-11-28 DIAGNOSIS — G459 Transient cerebral ischemic attack, unspecified: Secondary | ICD-10-CM

## 2015-11-28 DIAGNOSIS — S065X9A Traumatic subdural hemorrhage with loss of consciousness of unspecified duration, initial encounter: Secondary | ICD-10-CM | POA: Diagnosis present

## 2015-11-28 HISTORY — PX: CRANIOTOMY: SHX93

## 2015-11-28 LAB — TYPE AND SCREEN
ABO/RH(D): O POS
ANTIBODY SCREEN: NEGATIVE

## 2015-11-28 LAB — LIPID PANEL
Cholesterol: 227 mg/dL — ABNORMAL HIGH (ref 0–200)
HDL: 58 mg/dL (ref >40)
LDL CALC: 149 mg/dL — AB (ref 0–99)
TRIGLYCERIDES: 101 mg/dL (ref <150)
Total CHOL/HDL Ratio: 3.9 RATIO
VLDL: 20 mg/dL (ref 0–40)

## 2015-11-28 LAB — MRSA PCR SCREENING: MRSA by PCR: NEGATIVE

## 2015-11-28 SURGERY — CRANIOTOMY HEMATOMA EVACUATION SUBDURAL
Anesthesia: General | Site: Head | Laterality: Right

## 2015-11-28 MED ORDER — FENTANYL CITRATE (PF) 250 MCG/5ML IJ SOLN
INTRAMUSCULAR | Status: AC
  Filled 2015-11-28: qty 5

## 2015-11-28 MED ORDER — LIDOCAINE HCL (CARDIAC) 20 MG/ML IV SOLN
INTRAVENOUS | Status: AC
  Filled 2015-11-28: qty 5

## 2015-11-28 MED ORDER — MICROFIBRILLAR COLL HEMOSTAT EX PADS
MEDICATED_PAD | CUTANEOUS | Status: DC | PRN
  Administered 2015-11-28: 1 via TOPICAL

## 2015-11-28 MED ORDER — SODIUM CHLORIDE 0.9 % IV SOLN
INTRAVENOUS | Status: DC | PRN
  Administered 2015-11-28: 19:00:00 via INTRAVENOUS

## 2015-11-28 MED ORDER — ROCURONIUM BROMIDE 100 MG/10ML IV SOLN
INTRAVENOUS | Status: DC | PRN
  Administered 2015-11-28: 50 mg via INTRAVENOUS

## 2015-11-28 MED ORDER — ONDANSETRON HCL 4 MG PO TABS: 4.0000 mg | ORAL_TABLET | ORAL | Status: DC | PRN

## 2015-11-28 MED ORDER — ESMOLOL HCL 100 MG/10ML IV SOLN
INTRAVENOUS | Status: DC | PRN
  Administered 2015-11-28: 20 mg via INTRAVENOUS
  Administered 2015-11-28: 30 mg via INTRAVENOUS
  Administered 2015-11-28: 20 mg via INTRAVENOUS

## 2015-11-28 MED ORDER — MEPERIDINE HCL 25 MG/ML IJ SOLN: 6.2500 mg | INTRAMUSCULAR | Status: DC | PRN

## 2015-11-28 MED ORDER — ONDANSETRON HCL 4 MG/2ML IJ SOLN
INTRAMUSCULAR | Status: DC | PRN
  Administered 2015-11-28: 4 mg via INTRAVENOUS

## 2015-11-28 MED ORDER — HEMOSTATIC AGENTS (NO CHARGE) OPTIME
TOPICAL | Status: DC | PRN
  Administered 2015-11-28: 1 via TOPICAL

## 2015-11-28 MED ORDER — HYDROCODONE-ACETAMINOPHEN 5-325 MG PO TABS
1.0000 | ORAL_TABLET | ORAL | Status: DC | PRN
Start: 2015-11-28 — End: 2015-12-06
  Administered 2015-11-28 – 2015-12-05 (×2): 1 via ORAL
  Filled 2015-11-28 (×3): qty 1

## 2015-11-28 MED ORDER — PHENYLEPHRINE HCL 10 MG/ML IJ SOLN
10.0000 mg | INTRAVENOUS | Status: DC | PRN
  Administered 2015-11-28: 40 ug/min via INTRAVENOUS

## 2015-11-28 MED ORDER — HYDRALAZINE HCL 20 MG/ML IJ SOLN
5.0000 mg | Freq: Four times a day (QID) | INTRAMUSCULAR | Status: DC | PRN
  Administered 2015-11-28: 5 mg via INTRAVENOUS

## 2015-11-28 MED ORDER — PROMETHAZINE HCL 25 MG/ML IJ SOLN: 6.2500 mg | INTRAMUSCULAR | Status: DC | PRN

## 2015-11-28 MED ORDER — GI COCKTAIL ~~LOC~~
30.0000 mL | Freq: Once | ORAL | Status: AC
  Administered 2015-11-28: 30 mL via ORAL
  Filled 2015-11-28: qty 30

## 2015-11-28 MED ORDER — LABETALOL HCL 5 MG/ML IV SOLN
INTRAVENOUS | Status: AC
  Filled 2015-11-28: qty 4

## 2015-11-28 MED ORDER — LABETALOL HCL 5 MG/ML IV SOLN
10.0000 mg | INTRAVENOUS | Status: DC | PRN
  Administered 2015-11-28 – 2015-11-29 (×6): 10 mg via INTRAVENOUS
  Administered 2015-12-01: 40 mg via INTRAVENOUS
  Administered 2015-12-01: 20 mg via INTRAVENOUS
  Administered 2015-12-01: 40 mg via INTRAVENOUS
  Administered 2015-12-01: 10 mg via INTRAVENOUS
  Administered 2015-12-02: 20 mg via INTRAVENOUS
  Administered 2015-12-02: 10 mg via INTRAVENOUS
  Administered 2015-12-02: 20 mg via INTRAVENOUS
  Administered 2015-12-02: 40 mg via INTRAVENOUS
  Administered 2015-12-02 (×2): 10 mg via INTRAVENOUS
  Administered 2015-12-04: 20 mg via INTRAVENOUS
  Administered 2015-12-05: 10 mg via INTRAVENOUS
  Filled 2015-11-28 (×2): qty 8
  Filled 2015-11-28 (×4): qty 4
  Filled 2015-11-28: qty 8
  Filled 2015-11-28 (×3): qty 4
  Filled 2015-11-28: qty 8
  Filled 2015-11-28 (×3): qty 4

## 2015-11-28 MED ORDER — SODIUM CHLORIDE 0.9 % IV SOLN
Freq: Once | INTRAVENOUS | Status: AC
  Administered 2015-11-28: 19:00:00 via INTRAVENOUS

## 2015-11-28 MED ORDER — HYDROMORPHONE HCL 1 MG/ML IJ SOLN
0.5000 mg | INTRAMUSCULAR | Status: DC | PRN
  Administered 2015-11-29: 0.5 mg via INTRAVENOUS
  Filled 2015-11-28 (×2): qty 1

## 2015-11-28 MED ORDER — HYDRALAZINE HCL 20 MG/ML IJ SOLN
INTRAMUSCULAR | Status: AC
  Filled 2015-11-28: qty 1

## 2015-11-28 MED ORDER — LIDOCAINE HCL (CARDIAC) 20 MG/ML IV SOLN
INTRAVENOUS | Status: DC | PRN
  Administered 2015-11-28: 70 mg via INTRAVENOUS

## 2015-11-28 MED ORDER — EPHEDRINE SULFATE 50 MG/ML IJ SOLN
INTRAMUSCULAR | Status: DC | PRN
  Administered 2015-11-28: 5 mg via INTRAVENOUS

## 2015-11-28 MED ORDER — 0.9 % SODIUM CHLORIDE (POUR BTL) OPTIME
TOPICAL | Status: DC | PRN
  Administered 2015-11-28 (×2): 1000 mL

## 2015-11-28 MED ORDER — FENTANYL CITRATE (PF) 100 MCG/2ML IJ SOLN: 25.0000 ug | INTRAMUSCULAR | Status: DC | PRN

## 2015-11-28 MED ORDER — FENTANYL CITRATE (PF) 100 MCG/2ML IJ SOLN
INTRAMUSCULAR | Status: DC | PRN
  Administered 2015-11-28 (×2): 50 ug via INTRAVENOUS

## 2015-11-28 MED ORDER — PROMETHAZINE HCL 12.5 MG PO TABS
12.5000 mg | ORAL_TABLET | ORAL | Status: DC | PRN
  Filled 2015-11-28: qty 2

## 2015-11-28 MED ORDER — LACTATED RINGERS IV SOLN: INTRAVENOUS | Status: DC

## 2015-11-28 MED ORDER — SUGAMMADEX SODIUM 200 MG/2ML IV SOLN
INTRAVENOUS | Status: DC | PRN
  Administered 2015-11-28: 130.6 mg via INTRAVENOUS

## 2015-11-28 MED ORDER — HYDRALAZINE HCL 20 MG/ML IJ SOLN
10.0000 mg | Freq: Once | INTRAMUSCULAR | Status: AC
  Administered 2015-11-28: 10 mg via INTRAVENOUS
  Filled 2015-11-28: qty 1

## 2015-11-28 MED ORDER — CEFAZOLIN SODIUM-DEXTROSE 2-3 GM-% IV SOLR
2.0000 g | INTRAVENOUS | Status: AC
  Administered 2015-11-28: 2 g via INTRAVENOUS
  Filled 2015-11-28: qty 50

## 2015-11-28 MED ORDER — ONDANSETRON HCL 4 MG/2ML IJ SOLN: 4.0000 mg | INTRAMUSCULAR | Status: DC | PRN

## 2015-11-28 MED ORDER — CEFAZOLIN SODIUM 1-5 GM-% IV SOLN
1.0000 g | Freq: Three times a day (TID) | INTRAVENOUS | Status: AC
  Administered 2015-11-29 (×2): 1 g via INTRAVENOUS
  Filled 2015-11-28 (×2): qty 50

## 2015-11-28 MED ORDER — POTASSIUM CHLORIDE IN NACL 20-0.9 MEQ/L-% IV SOLN
INTRAVENOUS | Status: DC
Start: 2015-11-28 — End: 2015-12-06
  Administered 2015-11-28 – 2015-12-02 (×5): via INTRAVENOUS
  Administered 2015-12-03: 1000 mL via INTRAVENOUS
  Administered 2015-12-04: 18:00:00 via INTRAVENOUS
  Administered 2015-12-06: 75 mL/h via INTRAVENOUS
  Filled 2015-11-28 (×18): qty 1000

## 2015-11-28 MED ORDER — PANTOPRAZOLE SODIUM 40 MG IV SOLR
40.0000 mg | Freq: Every day | INTRAVENOUS | Status: DC
  Administered 2015-11-28 – 2015-11-30 (×3): 40 mg via INTRAVENOUS
  Filled 2015-11-28 (×3): qty 40

## 2015-11-28 MED ORDER — NALOXONE HCL 0.4 MG/ML IJ SOLN: 0.0800 mg | INTRAMUSCULAR | Status: DC | PRN

## 2015-11-28 MED ORDER — LIDOCAINE-EPINEPHRINE 1 %-1:100000 IJ SOLN
INTRAMUSCULAR | Status: DC | PRN
  Administered 2015-11-28: 20 mL via INTRADERMAL

## 2015-11-28 MED ORDER — PROPOFOL 10 MG/ML IV BOLUS
INTRAVENOUS | Status: DC | PRN
  Administered 2015-11-28: 100 mg via INTRAVENOUS

## 2015-11-28 MED ORDER — ONDANSETRON HCL 4 MG/2ML IJ SOLN
INTRAMUSCULAR | Status: AC
  Filled 2015-11-28: qty 2

## 2015-11-28 MED ORDER — ACETAMINOPHEN 325 MG PO TABS
650.0000 mg | ORAL_TABLET | ORAL | Status: DC | PRN
  Administered 2015-11-28 – 2015-12-05 (×5): 650 mg via ORAL
  Filled 2015-11-28 (×6): qty 2

## 2015-11-28 MED ORDER — NITROGLYCERIN IN D5W 200-5 MCG/ML-% IV SOLN
0.0000 ug/min | Freq: Once | INTRAVENOUS | Status: DC
  Filled 2015-11-28: qty 250

## 2015-11-28 MED ORDER — SODIUM CHLORIDE 0.9 % IR SOLN
Status: DC | PRN
  Administered 2015-11-28: 500 mL

## 2015-11-28 MED ORDER — SURGIFOAM 100 EX MISC
CUTANEOUS | Status: DC | PRN
  Administered 2015-11-28: 20 mL via TOPICAL

## 2015-11-28 SURGICAL SUPPLY — 68 items
BAG DECANTER FOR FLEXI CONT (MISCELLANEOUS) ×3 IMPLANT
BANDAGE GAUZE 4  KLING STR (GAUZE/BANDAGES/DRESSINGS) IMPLANT
BIT DRILL WIRE PASS 1.3MM (BIT) ×1 IMPLANT
BLADE SURG 11 STRL SS (BLADE) IMPLANT
BNDG COHESIVE 4X5 TAN NS LF (GAUZE/BANDAGES/DRESSINGS) IMPLANT
BRUSH SCRUB EZ 1% IODOPHOR (MISCELLANEOUS) IMPLANT
BRUSH SCRUB EZ PLAIN DRY (MISCELLANEOUS) ×3 IMPLANT
BUR ACORN 6.0 PRECISION (BURR) ×2 IMPLANT
BUR ACORN 6.0MM PRECISION (BURR) ×1
BUR SPIRAL ROUTER 2.3 (BUR) IMPLANT
BUR SPIRAL ROUTER 2.3MM (BUR)
CANISTER SUCT 3000ML PPV (MISCELLANEOUS) ×3 IMPLANT
CLIP TI MEDIUM 6 (CLIP) IMPLANT
COVER BACK TABLE 60X90IN (DRAPES) ×6 IMPLANT
DRAIN CHANNEL 10M FLAT 3/4 FLT (DRAIN) IMPLANT
DRAPE MICROSCOPE LEICA (MISCELLANEOUS) IMPLANT
DRAPE NEUROLOGICAL W/INCISE (DRAPES) ×3 IMPLANT
DRAPE SURG 17X23 STRL (DRAPES) IMPLANT
DRAPE WARM FLUID 44X44 (DRAPE) ×3 IMPLANT
DRILL WIRE PASS 1.3MM (BIT) ×3
ELECT CAUTERY BLADE 6.4 (BLADE) ×3 IMPLANT
ELECT REM PT RETURN 9FT ADLT (ELECTROSURGICAL) ×3
ELECTRODE REM PT RTRN 9FT ADLT (ELECTROSURGICAL) ×1 IMPLANT
EVACUATOR SILICONE 100CC (DRAIN) IMPLANT
GAUZE SPONGE 4X4 12PLY STRL (GAUZE/BANDAGES/DRESSINGS) ×3 IMPLANT
GAUZE SPONGE 4X4 16PLY XRAY LF (GAUZE/BANDAGES/DRESSINGS) IMPLANT
GLOVE ECLIPSE 9.0 STRL (GLOVE) ×3 IMPLANT
GLOVE EXAM NITRILE LRG STRL (GLOVE) IMPLANT
GLOVE EXAM NITRILE MD LF STRL (GLOVE) IMPLANT
GLOVE EXAM NITRILE XL STR (GLOVE) IMPLANT
GLOVE EXAM NITRILE XS STR PU (GLOVE) IMPLANT
GOWN STRL REUS W/ TWL LRG LVL3 (GOWN DISPOSABLE) IMPLANT
GOWN STRL REUS W/ TWL XL LVL3 (GOWN DISPOSABLE) IMPLANT
GOWN STRL REUS W/TWL 2XL LVL3 (GOWN DISPOSABLE) IMPLANT
GOWN STRL REUS W/TWL LRG LVL3 (GOWN DISPOSABLE)
GOWN STRL REUS W/TWL XL LVL3 (GOWN DISPOSABLE)
HEMOSTAT SURGICEL 2X14 (HEMOSTASIS) IMPLANT
KIT BASIN OR (CUSTOM PROCEDURE TRAY) ×3 IMPLANT
KIT ROOM TURNOVER OR (KITS) ×3 IMPLANT
LIQUID BAND (GAUZE/BANDAGES/DRESSINGS) IMPLANT
NDL HYPO 25X1 1.5 SAFETY (NEEDLE) ×1 IMPLANT
NEEDLE HYPO 25X1 1.5 SAFETY (NEEDLE) ×3 IMPLANT
NS IRRIG 1000ML POUR BTL (IV SOLUTION) ×3 IMPLANT
PACK CRANIOTOMY (CUSTOM PROCEDURE TRAY) ×3 IMPLANT
PAD ARMBOARD 7.5X6 YLW CONV (MISCELLANEOUS) ×3 IMPLANT
PAD EYE OVAL STERILE LF (GAUZE/BANDAGES/DRESSINGS) IMPLANT
PATTIES SURGICAL .25X.25 (GAUZE/BANDAGES/DRESSINGS) IMPLANT
PATTIES SURGICAL .5 X.5 (GAUZE/BANDAGES/DRESSINGS) IMPLANT
PATTIES SURGICAL .5 X3 (DISPOSABLE) IMPLANT
PATTIES SURGICAL 1X1 (DISPOSABLE) IMPLANT
PIN MAYFIELD SKULL DISP (PIN) IMPLANT
PLATE 1.5  2HOLE LNG NEURO (Plate) ×6 IMPLANT
PLATE 1.5 2HOLE LNG NEURO (Plate) IMPLANT
SCREW SELF DRILL HT 1.5/4MM (Screw) ×12 IMPLANT
SPECIMEN JAR SMALL (MISCELLANEOUS) IMPLANT
SPONGE LAP 18X18 X RAY DECT (DISPOSABLE) IMPLANT
SPONGE NEURO XRAY DETECT 1X3 (DISPOSABLE) IMPLANT
SPONGE SURGIFOAM ABS GEL 100 (HEMOSTASIS) ×3 IMPLANT
STAPLER VISISTAT 35W (STAPLE) ×3 IMPLANT
SUT NURALON 4 0 TR CR/8 (SUTURE) ×6 IMPLANT
SUT VIC AB 2-0 CT2 18 VCP726D (SUTURE) ×6 IMPLANT
SYR CONTROL 10ML LL (SYRINGE) ×3 IMPLANT
TAPE CLOTH 1X10 TAN NS (GAUZE/BANDAGES/DRESSINGS) ×2 IMPLANT
TOWEL OR 17X24 6PK STRL BLUE (TOWEL DISPOSABLE) ×3 IMPLANT
TOWEL OR 17X26 10 PK STRL BLUE (TOWEL DISPOSABLE) ×3 IMPLANT
TRAY FOLEY W/METER SILVER 14FR (SET/KITS/TRAYS/PACK) IMPLANT
UNDERPAD 30X30 INCONTINENT (UNDERPADS AND DIAPERS) IMPLANT
WATER STERILE IRR 1000ML POUR (IV SOLUTION) ×3 IMPLANT

## 2015-11-28 NOTE — Consult Note (Signed)
Reason for Consult:Subdural Hematoma Referring Physician: [place] Fant is an 81 y.o. male.  HPI: 81 year old male who presented to the emergency department last night with complaints of left-sided weakness. Patient with history of remote falls greater than 2 months ago striking the right side of his head. Patient denies any recent falls or trauma. He complains of headache. Weakness is been progressive over the past 24 hours. He takes no antiplatelet agents or blood thinners. He has no history of stroke or prior hemorrhage. He denies any active cardiac disease.  Past Medical History  Diagnosis Date  . OTH MALIG NEOPLASM SKIN OTH\T\UNSPEC PARTS FACE 02/10/2009  . HYPERLIPIDEMIA 08/19/2007  . 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 ACTINIC KERATOSIS, EAR, LEFT 06/13/2008  . PROSTATE SPECIFIC ANTIGEN, ELEVATED, CHRONIC 06/15/2010  . History of prostate cancer 2001--  MILD FORM--  NO TX---  FOLLOWED BY DR [facility]  . BPH (benign prostatic hypertrophy)   . Carotid stenosis, asymptomatic BILATERAL MILD ICA STENOSIS   < 50%    PER CAROTID DUPLEX  09-21-2010  . Arthritis   . Colon polyp   . Irritable bowel syndrome   . Prostate cancer metastatic to bone [facility])     Past Surgical History  Procedure Laterality Date  . Total knee arthroplasty  02-11-2006     left knee  oa  . Transurethral resection of prostate  07-29-2000    bph w/ boo  . Lumbar laminotomy and foraminotomy bilaterally  05-12-2007    L2 - 3 ;  SPONDYLOSIS W/ STENOSIS  . Cardiovascular stress test  09-20-2010  DR BENSIMHON    NORMAL NUCLEAR STUDY/ NO ISCHEMIA/ EF 68%  . Prostate biopsy  07/16/2012    Procedure: PROSTATE BIOPSY;  Surgeon: Malka So, MD;  Location: [facility];  Service: Urology;  Laterality: N/A;  WITH ULTRASOUND ([facility] ULTRA SOUND TECH COMING) OWER  . Transurethral resection of prostate  07/16/2012    Procedure: TRANSURETHRAL RESECTION OF THE PROSTATE  (TURP);  Surgeon: Malka So, MD;  Location: [facility];  Service: Urology;;  with gyrus    Family History  Problem Relation Age of Onset  . Arthritis Mother   . Cancer Father   . Prostate cancer Father   . Colon cancer Mother     Died at age 91  . Colon polyps Neg Hx     Social History:  reports that he has never smoked. He has never used smokeless tobacco. He reports that he does not drink alcohol or use illicit drugs.  Allergies: No Known Allergies  Medications: I have reviewed the patient's current medications.  Results for orders placed or performed during the hospital encounter of 11/27/15 (from the past 48 hour(s))  Ethanol     Status: None   Collection Time: 11/27/15  8:09 PM  Result Value Ref Range   Alcohol, Ethyl (B) <5 <5 mg/dL    Comment:        LOWEST DETECTABLE LIMIT FOR SERUM ALCOHOL IS 5 mg/dL FOR MEDICAL PURPOSES ONLY   Protime-INR     Status: None   Collection Time: 11/27/15  8:09 PM  Result Value Ref Range   Prothrombin Time 14.7 11.6 - 15.2 seconds   INR 1.13 0.00 - 1.49  APTT     Status: None   Collection Time: 11/27/15  8:09 PM  Result Value Ref Range   aPTT 34 24 - 37 seconds  CBC  Status: Abnormal   Collection Time: 11/27/15  8:09 PM  Result Value Ref Range   WBC 9.6 4.0 - 10.5 K/uL   RBC 4.52 4.22 - 5.81 MIL/uL   Hemoglobin 13.4 13.0 - 17.0 g/dL   HCT 38.7 (L) 39.0 - 52.0 %   MCV 85.6 78.0 - 100.0 fL   MCH 29.6 26.0 - 34.0 pg   MCHC 34.6 30.0 - 36.0 g/dL   RDW 12.9 11.5 - 15.5 %   Platelets 182 150 - 400 K/uL  Differential     Status: None   Collection Time: 11/27/15  8:09 PM  Result Value Ref Range   Neutrophils Relative % 79 %   Neutro Abs 7.5 1.7 - 7.7 K/uL   Lymphocytes Relative 12 %   Lymphs Abs 1.1 0.7 - 4.0 K/uL   Monocytes Relative 9 %   Monocytes Absolute 0.8 0.1 - 1.0 K/uL   Eosinophils Relative 0 %   Eosinophils Absolute 0.0 0.0 - 0.7 K/uL   Basophils Relative 0 %   Basophils Absolute 0.0 0.0 - 0.1  K/uL  Comprehensive metabolic panel     Status: Abnormal   Collection Time: 11/27/15  8:09 PM  Result Value Ref Range   Sodium 127 (L) 135 - 145 mmol/L   Potassium 4.2 3.5 - 5.1 mmol/L   Chloride 91 (L) 101 - 111 mmol/L   CO2 26 22 - 32 mmol/L   Glucose, Bld 115 (H) 65 - 99 mg/dL   BUN 22 (H) 6 - 20 mg/dL   Creatinine, Ser 1.07 0.61 - 1.24 mg/dL   Calcium 9.9 8.9 - 10.3 mg/dL   Total Protein 6.9 6.5 - 8.1 g/dL   Albumin 4.4 3.5 - 5.0 g/dL   AST 31 15 - 41 U/L   ALT 19 17 - 63 U/L   Alkaline Phosphatase 88 38 - 126 U/L   Total Bilirubin 0.7 0.3 - 1.2 mg/dL   GFR calc non Af Amer 59 (L) >60 mL/min   GFR calc Af Amer >60 >60 mL/min    Comment: (NOTE) The eGFR has been calculated using the CKD EPI equation. This calculation has not been validated in all clinical situations. eGFR's persistently <60 mL/min signify possible Chronic Kidney Disease.    Anion gap 10 5 - 15  I-stat troponin, ED (not at [facility], [facility])     Status: None   Collection Time: 11/27/15  8:13 PM  Result Value Ref Range   Troponin i, poc 0.04 0.00 - 0.08 ng/mL   Comment 3            Comment: Due to the release kinetics of cTnI, a negative result within the first hours of the onset of symptoms does not rule out myocardial infarction with certainty. If myocardial infarction is still suspected, repeat the test at appropriate intervals.   I-Stat Chem 8, ED  (not at [facility], [facility])     Status: Abnormal   Collection Time: 11/27/15  8:15 PM  Result Value Ref Range   Sodium 129 (L) 135 - 145 mmol/L   Potassium 4.2 3.5 - 5.1 mmol/L   Chloride 92 (L) 101 - 111 mmol/L   BUN 25 (H) 6 - 20 mg/dL   Creatinine, Ser 1.10 0.61 - 1.24 mg/dL   Glucose, Bld 113 (H) 65 - 99 mg/dL   Calcium, Ion 1.18 1.13 - 1.30 mmol/L   TCO2 27 0 - 100 mmol/L   Hemoglobin 14.3 13.0 - 17.0 g/dL   HCT 42.0 39.0 -  52.0 %  Urine rapid drug screen (hosp performed)not at [facility]     Status: Abnormal   Collection Time: 11/27/15  8:48 PM  Result Value Ref  Range   Opiates NONE DETECTED NONE DETECTED   Cocaine NONE DETECTED NONE DETECTED   Benzodiazepines POSITIVE (A) NONE DETECTED   Amphetamines NONE DETECTED NONE DETECTED   Tetrahydrocannabinol NONE DETECTED NONE DETECTED   Barbiturates NONE DETECTED NONE DETECTED    Comment:        DRUG SCREEN FOR MEDICAL PURPOSES ONLY.  IF CONFIRMATION IS NEEDED FOR ANY PURPOSE, NOTIFY LAB WITHIN 5 DAYS.        LOWEST DETECTABLE LIMITS FOR URINE DRUG SCREEN Drug Class       Cutoff (ng/mL) Amphetamine      1000 Barbiturate      200 Benzodiazepine   366 Tricyclics       294 Opiates          300 Cocaine          300 THC              50   Urinalysis, Routine w reflex microscopic (not at [facility])     Status: Abnormal   Collection Time: 11/27/15  8:48 PM  Result Value Ref Range   Color, Urine YELLOW YELLOW   APPearance CLOUDY (A) CLEAR   Specific Gravity, Urine 1.012 1.005 - 1.030   pH 7.5 5.0 - 8.0   Glucose, UA NEGATIVE NEGATIVE mg/dL   Hgb urine dipstick TRACE (A) NEGATIVE   Bilirubin Urine NEGATIVE NEGATIVE   Ketones, ur NEGATIVE NEGATIVE mg/dL   Protein, ur NEGATIVE NEGATIVE mg/dL   Nitrite NEGATIVE NEGATIVE   Leukocytes, UA NEGATIVE NEGATIVE  Urine microscopic-add on     Status: Abnormal   Collection Time: 11/27/15  8:48 PM  Result Value Ref Range   Squamous Epithelial / LPF NONE SEEN NONE SEEN    Comment: Please note change in reference range.   WBC, UA 0-5 0 - 5 WBC/hpf    Comment: Please note change in reference range.   RBC / HPF 0-5 0 - 5 RBC/hpf    Comment: Please note change in reference range.   Bacteria, UA RARE (A) NONE SEEN    Comment: Please note change in reference range.  MRSA PCR Screening     Status: None   Collection Time: 11/28/15 12:00 AM  Result Value Ref Range   MRSA by PCR NEGATIVE NEGATIVE    Comment:        The GeneXpert MRSA Assay (FDA approved for NASAL specimens only), is one component of a comprehensive MRSA colonization surveillance program. It is  not intended to diagnose MRSA infection nor to guide or monitor treatment for MRSA infections.   Lipid panel     Status: Abnormal   Collection Time: 11/28/15 12:30 AM  Result Value Ref Range   Cholesterol 227 (H) 0 - 200 mg/dL   Triglycerides 101 <150 mg/dL   HDL 58 >40 mg/dL   Total CHOL/HDL Ratio 3.9 RATIO   VLDL 20 0 - 40 mg/dL   LDL Cholesterol 149 (H) 0 - 99 mg/dL    Comment:        Total Cholesterol/HDL:CHD Risk Coronary Heart Disease Risk Table                     Men   Women  1/2 Average Risk   3.4   3.3  Average Risk  5.0   4.4  2 X Average Risk   9.6   7.1  3 X Average Risk  23.4   11.0        Use the calculated Patient Ratio above and the CHD Risk Table to determine the patient's CHD Risk.        ATP III CLASSIFICATION (LDL):  <100     mg/dL   Optimal  100-129  mg/dL   Near or Above                    Optimal  130-159  mg/dL   Borderline  160-189  mg/dL   High  >190     mg/dL   Very High     Ct Head Wo Contrast  11/28/2015  CLINICAL DATA:  Fall,  subdural/epidural hemorrhage EXAM: CT HEAD WITHOUT CONTRAST TECHNIQUE: Contiguous axial images were obtained from the base of the skull through the vertex without intravenous contrast. COMPARISON:  11/27/2015 FINDINGS: Again noted acute right hemispheric subdural hemorrhage measures 1.5 cm maximum thickness. There is mild mass effect on the right hemisphere. Again noted about 7 mm right to left midline shift. There is no intraventricular hemorrhage. Stable small amount of subdural blood along the interhemispheric fissure and tentorium. No new hemorrhage is noted. No definite acute cortical infarction. Stable atrophy and chronic white matter disease P IMPRESSION: Again noted acute right hemispheric subdural hemorrhage measures 1.5 cm maximum thickness. There is mild mass effect on the right hemisphere. Again noted about 7 mm right to left midline shift. There is no intraventricular hemorrhage. Stable small amount of  subdural blood along the interhemispheric fissure and tentorium. No new hemorrhage is noted. Electronically Signed   By: Lahoma Crocker M.D.   On: 11/28/2015 11:03   Ct Head Wo Contrast  11/27/2015  CLINICAL DATA:  Right-sided weakness. EXAM: CT HEAD WITHOUT CONTRAST TECHNIQUE: Contiguous axial images were obtained from the base of the skull through the vertex without intravenous contrast. COMPARISON:  PET-CT 05/03/2015 FINDINGS: There is extra-axial hyperdense material along the right frontal cerebral convexity measuring 6 mm in thickness with an area demonstrating a convex margin. There is slight hyperdensity of the left tentorium. There is 2 mm of right-to-left midline shift. There is no evidence of a cortical-based area of acute infarction. There is generalized cerebral atrophy. There is periventricular white matter low attenuation likely secondary to microangiopathy. The ventricles and sulci are appropriate for the patient's age. The basal cisterns are patent. Visualized portions of the orbits are unremarkable. The visualized portions of the paranasal sinuses and mastoid air cells are unremarkable. Cerebrovascular atherosclerotic calcifications are noted. The osseous structures are unremarkable. There is soft tissue swelling along the right frontotemporal calvarium. IMPRESSION: 1. Extra-axial hyperdense material along the right frontal cerebral convexity measuring 6 mm in thickness likely representing acute subdural hemorrhage, but there is an area demonstrating a convex margin which could reflect a epidural hematoma. Slight hyperdensity of the left tentorium likely representing a small amount of subdural blood. Critical Value/emergent results were called by telephone at the time of interpretation on 11/27/2015 at 8:44 pm to Dr. Aram Beecham, who verbally acknowledged these results. Electronically Signed   By: Kathreen Devoid   On: 11/27/2015 20:45   Mr Jodene Nam Head Wo Contrast  11/28/2015  CLINICAL DATA:  Initial  evaluation for acute trauma, fall, transient right leg weakness. EXAM: MRI HEAD WITHOUT CONTRAST MRA HEAD WITHOUT CONTRAST TECHNIQUE: Multiplanar, multiecho pulse sequences of the brain and surrounding structures were  obtained without intravenous contrast. Angiographic images of the head were obtained using MRA technique without contrast. COMPARISON:  Prior CT from earlier the same day. FINDINGS: MRI HEAD FINDINGS Study is degraded by motion artifact. Diffuse prominence of the CSF containing spaces is compatible with generalized age-related cerebral atrophy. Patchy and confluent T2/FLAIR hyperintensity within the periventricular and deep white matter both cerebral hemispheres most consistent with chronic small vessel ischemic changes. No evidence for acute infarct. Normal intravascular flow voids are maintained. Acute subdural hematoma overlying the right cerebral convexity again seen, measuring up to 17 mm. Small amount of subdural blood extends along the falx and tentorium. Blood extends around the right cerebellar hemisphere. Trace subdural blood overlies the left cerebral convexity as well. There is associated 7 mm of right-to-left shift. Partial effacement of the right lateral ventricle. Basilar cisterns remain patent. No hydrocephalus. No mass lesion identified. Craniocervical junction within normal limits. Multilevel degenerative spondylolysis present within the visualized upper cervical spine. Degenerative thickening present within the tectorial membrane. Pituitary gland within normal limits. No acute abnormality about the orbits. Sequelae of prior bilateral lens extraction. Scattered mucosal thickening within the paranasal sinuses. No mastoid effusion. Inner ear structures within normal limits. Bone marrow signal intensity normal. No scalp soft tissue abnormality. MRA HEAD FINDINGS ANTERIOR CIRCULATION: Visualized distal cervical segments of the internal carotid arteries are widely patent with antegrade  flow. The petrous, cavernous, and supraclinoid segments are widely patent. A1 segments, anterior communicating artery common anterior slurred well arteries well opacified bilaterally. M1 segments patent without stenosis or occlusion. MCA bifurcations within normal limits. Distal MCA branches symmetric and well opacified. Distal small vessel atheromatous irregularity within the MCA branches bilaterally. Vertebral arteries are patent to the vertebrobasilar junction. Posterior inferior cerebral arteries patent bilaterally. Basilar artery widely patent. Superior cerebellar arteries opacified proximally. Right P1 and P2 segments widely patent. Fetal origin of the left PCA. Left PCA itself is well opacified. No convincing evidence for aneurysm. Minimal irregularity at the origin of the ophthalmic arteries favored to be artifactual in nature related to motion. IMPRESSION: MRI HEAD IMPRESSION: 1. Acute right holo hemispheric subdural hematoma measuring up to 17 mm in maximal thickness with associated 7 mm of right-to-left shift. No hydrocephalus. 2. Trace subdural blood extending along the falx, tentorium, and overlying the left cerebral hemisphere as well. 3. No other acute intracranial process.  No infarct. 4. Atrophy with moderate chronic small vessel ischemic disease. MRA HEAD IMPRESSION: 1. Negative intracranial MRA. No large or proximal arterial branch occlusion. No correctable or high-grade stenosis. 2. Mild distal branch atheromatous irregularity within the MCA branches bilaterally. Electronically Signed   By: Jeannine Boga M.D.   On: 11/28/2015 00:28   Mr Brain Wo Contrast  11/28/2015  CLINICAL DATA:  Initial evaluation for acute trauma, fall, transient right leg weakness. EXAM: MRI HEAD WITHOUT CONTRAST MRA HEAD WITHOUT CONTRAST TECHNIQUE: Multiplanar, multiecho pulse sequences of the brain and surrounding structures were obtained without intravenous contrast. Angiographic images of the head were  obtained using MRA technique without contrast. COMPARISON:  Prior CT from earlier the same day. FINDINGS: MRI HEAD FINDINGS Study is degraded by motion artifact. Diffuse prominence of the CSF containing spaces is compatible with generalized age-related cerebral atrophy. Patchy and confluent T2/FLAIR hyperintensity within the periventricular and deep white matter both cerebral hemispheres most consistent with chronic small vessel ischemic changes. No evidence for acute infarct. Normal intravascular flow voids are maintained. Acute subdural hematoma overlying the right cerebral convexity again seen, measuring up to  17 mm. Small amount of subdural blood extends along the falx and tentorium. Blood extends around the right cerebellar hemisphere. Trace subdural blood overlies the left cerebral convexity as well. There is associated 7 mm of right-to-left shift. Partial effacement of the right lateral ventricle. Basilar cisterns remain patent. No hydrocephalus. No mass lesion identified. Craniocervical junction within normal limits. Multilevel degenerative spondylolysis present within the visualized upper cervical spine. Degenerative thickening present within the tectorial membrane. Pituitary gland within normal limits. No acute abnormality about the orbits. Sequelae of prior bilateral lens extraction. Scattered mucosal thickening within the paranasal sinuses. No mastoid effusion. Inner ear structures within normal limits. Bone marrow signal intensity normal. No scalp soft tissue abnormality. MRA HEAD FINDINGS ANTERIOR CIRCULATION: Visualized distal cervical segments of the internal carotid arteries are widely patent with antegrade flow. The petrous, cavernous, and supraclinoid segments are widely patent. A1 segments, anterior communicating artery common anterior slurred well arteries well opacified bilaterally. M1 segments patent without stenosis or occlusion. MCA bifurcations within normal limits. Distal MCA branches  symmetric and well opacified. Distal small vessel atheromatous irregularity within the MCA branches bilaterally. Vertebral arteries are patent to the vertebrobasilar junction. Posterior inferior cerebral arteries patent bilaterally. Basilar artery widely patent. Superior cerebellar arteries opacified proximally. Right P1 and P2 segments widely patent. Fetal origin of the left PCA. Left PCA itself is well opacified. No convincing evidence for aneurysm. Minimal irregularity at the origin of the ophthalmic arteries favored to be artifactual in nature related to motion. IMPRESSION: MRI HEAD IMPRESSION: 1. Acute right holo hemispheric subdural hematoma measuring up to 17 mm in maximal thickness with associated 7 mm of right-to-left shift. No hydrocephalus. 2. Trace subdural blood extending along the falx, tentorium, and overlying the left cerebral hemisphere as well. 3. No other acute intracranial process.  No infarct. 4. Atrophy with moderate chronic small vessel ischemic disease. MRA HEAD IMPRESSION: 1. Negative intracranial MRA. No large or proximal arterial branch occlusion. No correctable or high-grade stenosis. 2. Mild distal branch atheromatous irregularity within the MCA branches bilaterally. Electronically Signed   By: Jeannine Boga M.D.   On: 11/28/2015 00:28    Pertinent items are noted in HPI. Blood pressure 133/64, pulse 92, temperature 97.4 F (36.3 C), temperature source Oral, resp. rate 15, height 5' 2"  (1.575 m), weight 65.3 kg (143 lb 15.4 oz), SpO2 98 %. The patient is awake and alert. He is oriented and appropriate. His speech is fluent. His judgment and insight appear intact. Cranial nerve function reveals mild weakness of his left face otherwise cranial nerve function is intact. Motor examination the extremities reveal mild weakness of his left upper and lower extremities evidence by a left-sided pronator drift. Sensation is intact bilaterally. Reflexes are normal. Examination head ears  eyes and throat demonstrates evidence of some old bruising along the right frontal area. He has changes consistent with previous skin cancer surgeries in this region as well. No evidence of recent trauma. No evidence of bony abnormality. Examination head ears eyes and throat is unremarkable otherwise. Chest and abdomen are benign. Extremities are free from injury or deformity.  Assessment/Plan: Follow-up head CT scan and MRI scan done earlier today demonstrates significant enlargement of his right convexity subdural hematoma. There is mild mass effect and some mild dysfunction on examination. I discussed the situation in detail with the patient. Given the expanding nature of this hematoma and his current symptoms I think his best option for further treatment is with surgical evacuation of this hematoma. I discussed  the risks and benefits of a right-sided craniotomy and evacuation of subdural hematoma including but not limited to the risk of anesthesia, bleeding, infection, CSF leak, brain injury including stroke, coma, and death. I've also discussed the risk of recurrence of the hematoma and non-benefit. The patient is aware and wishes to proceed. Plan for surgery early this evening.  Nijee Heatwole A 11/28/2015, 12:18 PM

## 2015-11-28 NOTE — Progress Notes (Signed)
PT Cancellation Note  Patient Details Name: [name] MRN: [id number] DOB: 21-Nov-1922   Cancelled Treatment:    Reason Eval/Treat Not Completed: Medical issues which prohibited therapy.  Pt to OR later today for surgical evacuation of hematoma 2/2 significant enlargement of his right convexity subdural hematoma.  PT will continue to follow acutely.  Thank you for this order.  Joslyn Hy PT, DPT [phone] Pager: [phone] 11/28/2015, 2:13 PM

## 2015-11-28 NOTE — Progress Notes (Signed)
[facility] PROGRESS NOTE  [name] [id number] DOB: 1922-02-29 DOA: 11/27/2015 PCP: Hoyt Koch, MD  Length of stay: 1   Assessment/Plan: Principal Problem:   Epidural hematoma ([place]) Active Problems:   HTN (hypertension)   Frequent falls   Right sided weakness   Brief summary 81 y.o. male right handed, with a past medical history significant for prostate cancer, HLD, BPH, brought in by EMS due to acute onset of confusion, right gaze preference, right leg weakness,  . Patient found to have subdural hemorrhage with right-to-left shift.etoh level <5 , PTT 34, INR 1.13, WBC 9.6, platelets 182, Na 127, Cr 1.07. Patient fell one month ago and hit the right side of his head but was not evaluated at that time. Denied any seizure-like activity  Assessment and plan Subdural hematoma with right-to-left shift, CT scan shows worsening according to Dr. Deri Fuelling from neurosurgery. Patient to be evaluated further by neurosurgery today. Neurology has signed off  Hyponatremia-baseline sodium around 135, could be due to cerebral salt wasting, monitor sodium and placed on fluid restriction  Fall-this was a month ago, UA negative, history of prostrate cancer with metastasis, will obtain chest x-ray,   DVT prophylaxsis SCDs  Code Status:      Code Status Orders        Start     Ordered   11/27/15 2208  Full code   Continuous     11/27/15 2208     Family Communication: family updated about patient's clinical progress Disposition Plan:  As above       Consultants:  Neurosurgery  Procedures:  None  Antibiotics: Anti-infectives    None         HPI/Subjective: Afebrile. Vitals are stable. Awakens easily. Appears alert and aware. Speech reasonably fluent  Objective: Filed Vitals:   11/28/15 0400 11/28/15 0548 11/28/15 0804 11/28/15 1132  BP: 131/64 130/59 133/64   Pulse: 90  92   Temp: 98.3 F (36.8 C)  98 F (36.7 C) 97.4 F (36.3  C)  TempSrc: Oral  Oral Oral  Resp: 14  15   Height:      Weight:      SpO2: 99%  98%     Intake/Output Summary (Last 24 hours) at 11/28/15 1153 Last data filed at 11/28/15 0600  Gross per 24 hour  Intake    360 ml  Output   1400 ml  Net  -1040 ml    Exam:  General: No acute respiratory distress Lungs: Clear to auscultation bilaterally without wheezes or crackles Cardiovascular: Regular rate and rhythm without murmur gallop or rub normal S1 and S2 Abdomen: Nontender, nondistended, soft, bowel sounds positive, no rebound, no ascites, no appreciable mass Extremities: No significant cyanosis, clubbing, or edema bilateral lower extremities     Data Review   Micro Results Recent Results (from the past 240 hour(s))  MRSA PCR Screening     Status: None   Collection Time: 11/28/15 12:00 AM  Result Value Ref Range Status   MRSA by PCR NEGATIVE NEGATIVE Final    Comment:        The GeneXpert MRSA Assay (FDA approved for NASAL specimens only), is one component of a comprehensive MRSA colonization surveillance program. It is not intended to diagnose MRSA infection nor to guide or monitor treatment for MRSA infections.     Radiology Reports Ct Head Wo Contrast  11/28/2015  CLINICAL DATA:  Fall,  subdural/epidural hemorrhage EXAM: CT HEAD  WITHOUT CONTRAST TECHNIQUE: Contiguous axial images were obtained from the base of the skull through the vertex without intravenous contrast. COMPARISON:  11/27/2015 FINDINGS: Again noted acute right hemispheric subdural hemorrhage measures 1.5 cm maximum thickness. There is mild mass effect on the right hemisphere. Again noted about 7 mm right to left midline shift. There is no intraventricular hemorrhage. Stable small amount of subdural blood along the interhemispheric fissure and tentorium. No new hemorrhage is noted. No definite acute cortical infarction. Stable atrophy and chronic white matter disease P IMPRESSION: Again noted acute right  hemispheric subdural hemorrhage measures 1.5 cm maximum thickness. There is mild mass effect on the right hemisphere. Again noted about 7 mm right to left midline shift. There is no intraventricular hemorrhage. Stable small amount of subdural blood along the interhemispheric fissure and tentorium. No new hemorrhage is noted. Electronically Signed   By: Lahoma Crocker M.D.   On: 11/28/2015 11:03   Ct Head Wo Contrast  11/27/2015  CLINICAL DATA:  Right-sided weakness. EXAM: CT HEAD WITHOUT CONTRAST TECHNIQUE: Contiguous axial images were obtained from the base of the skull through the vertex without intravenous contrast. COMPARISON:  PET-CT 05/03/2015 FINDINGS: There is extra-axial hyperdense material along the right frontal cerebral convexity measuring 6 mm in thickness with an area demonstrating a convex margin. There is slight hyperdensity of the left tentorium. There is 2 mm of right-to-left midline shift. There is no evidence of a cortical-based area of acute infarction. There is generalized cerebral atrophy. There is periventricular white matter low attenuation likely secondary to microangiopathy. The ventricles and sulci are appropriate for the patient's age. The basal cisterns are patent. Visualized portions of the orbits are unremarkable. The visualized portions of the paranasal sinuses and mastoid air cells are unremarkable. Cerebrovascular atherosclerotic calcifications are noted. The osseous structures are unremarkable. There is soft tissue swelling along the right frontotemporal calvarium. IMPRESSION: 1. Extra-axial hyperdense material along the right frontal cerebral convexity measuring 6 mm in thickness likely representing acute subdural hemorrhage, but there is an area demonstrating a convex margin which could reflect a epidural hematoma. Slight hyperdensity of the left tentorium likely representing a small amount of subdural blood. Critical Value/emergent results were called by telephone at the time  of interpretation on 11/27/2015 at 8:44 pm to Dr. Aram Beecham, who verbally acknowledged these results. Electronically Signed   By: Kathreen Devoid   On: 11/27/2015 20:45   Mr Jodene Nam Head Wo Contrast  11/28/2015  CLINICAL DATA:  Initial evaluation for acute trauma, fall, transient right leg weakness. EXAM: MRI HEAD WITHOUT CONTRAST MRA HEAD WITHOUT CONTRAST TECHNIQUE: Multiplanar, multiecho pulse sequences of the brain and surrounding structures were obtained without intravenous contrast. Angiographic images of the head were obtained using MRA technique without contrast. COMPARISON:  Prior CT from earlier the same day. FINDINGS: MRI HEAD FINDINGS Study is degraded by motion artifact. Diffuse prominence of the CSF containing spaces is compatible with generalized age-related cerebral atrophy. Patchy and confluent T2/FLAIR hyperintensity within the periventricular and deep white matter both cerebral hemispheres most consistent with chronic small vessel ischemic changes. No evidence for acute infarct. Normal intravascular flow voids are maintained. Acute subdural hematoma overlying the right cerebral convexity again seen, measuring up to 17 mm. Small amount of subdural blood extends along the falx and tentorium. Blood extends around the right cerebellar hemisphere. Trace subdural blood overlies the left cerebral convexity as well. There is associated 7 mm of right-to-left shift. Partial effacement of the right lateral ventricle. Basilar cisterns remain patent.  No hydrocephalus. No mass lesion identified. Craniocervical junction within normal limits. Multilevel degenerative spondylolysis present within the visualized upper cervical spine. Degenerative thickening present within the tectorial membrane. Pituitary gland within normal limits. No acute abnormality about the orbits. Sequelae of prior bilateral lens extraction. Scattered mucosal thickening within the paranasal sinuses. No mastoid effusion. Inner ear structures within  normal limits. Bone marrow signal intensity normal. No scalp soft tissue abnormality. MRA HEAD FINDINGS ANTERIOR CIRCULATION: Visualized distal cervical segments of the internal carotid arteries are widely patent with antegrade flow. The petrous, cavernous, and supraclinoid segments are widely patent. A1 segments, anterior communicating artery common anterior slurred well arteries well opacified bilaterally. M1 segments patent without stenosis or occlusion. MCA bifurcations within normal limits. Distal MCA branches symmetric and well opacified. Distal small vessel atheromatous irregularity within the MCA branches bilaterally. Vertebral arteries are patent to the vertebrobasilar junction. Posterior inferior cerebral arteries patent bilaterally. Basilar artery widely patent. Superior cerebellar arteries opacified proximally. Right P1 and P2 segments widely patent. Fetal origin of the left PCA. Left PCA itself is well opacified. No convincing evidence for aneurysm. Minimal irregularity at the origin of the ophthalmic arteries favored to be artifactual in nature related to motion. IMPRESSION: MRI HEAD IMPRESSION: 1. Acute right holo hemispheric subdural hematoma measuring up to 17 mm in maximal thickness with associated 7 mm of right-to-left shift. No hydrocephalus. 2. Trace subdural blood extending along the falx, tentorium, and overlying the left cerebral hemisphere as well. 3. No other acute intracranial process.  No infarct. 4. Atrophy with moderate chronic small vessel ischemic disease. MRA HEAD IMPRESSION: 1. Negative intracranial MRA. No large or proximal arterial branch occlusion. No correctable or high-grade stenosis. 2. Mild distal branch atheromatous irregularity within the MCA branches bilaterally. Electronically Signed   By: Jeannine Boga M.D.   On: 11/28/2015 00:28   Mr Brain Wo Contrast  11/28/2015  CLINICAL DATA:  Initial evaluation for acute trauma, fall, transient right leg weakness. EXAM:  MRI HEAD WITHOUT CONTRAST MRA HEAD WITHOUT CONTRAST TECHNIQUE: Multiplanar, multiecho pulse sequences of the brain and surrounding structures were obtained without intravenous contrast. Angiographic images of the head were obtained using MRA technique without contrast. COMPARISON:  Prior CT from earlier the same day. FINDINGS: MRI HEAD FINDINGS Study is degraded by motion artifact. Diffuse prominence of the CSF containing spaces is compatible with generalized age-related cerebral atrophy. Patchy and confluent T2/FLAIR hyperintensity within the periventricular and deep white matter both cerebral hemispheres most consistent with chronic small vessel ischemic changes. No evidence for acute infarct. Normal intravascular flow voids are maintained. Acute subdural hematoma overlying the right cerebral convexity again seen, measuring up to 17 mm. Small amount of subdural blood extends along the falx and tentorium. Blood extends around the right cerebellar hemisphere. Trace subdural blood overlies the left cerebral convexity as well. There is associated 7 mm of right-to-left shift. Partial effacement of the right lateral ventricle. Basilar cisterns remain patent. No hydrocephalus. No mass lesion identified. Craniocervical junction within normal limits. Multilevel degenerative spondylolysis present within the visualized upper cervical spine. Degenerative thickening present within the tectorial membrane. Pituitary gland within normal limits. No acute abnormality about the orbits. Sequelae of prior bilateral lens extraction. Scattered mucosal thickening within the paranasal sinuses. No mastoid effusion. Inner ear structures within normal limits. Bone marrow signal intensity normal. No scalp soft tissue abnormality. MRA HEAD FINDINGS ANTERIOR CIRCULATION: Visualized distal cervical segments of the internal carotid arteries are widely patent with antegrade flow. The petrous, cavernous, and supraclinoid  segments are widely patent.  A1 segments, anterior communicating artery common anterior slurred well arteries well opacified bilaterally. M1 segments patent without stenosis or occlusion. MCA bifurcations within normal limits. Distal MCA branches symmetric and well opacified. Distal small vessel atheromatous irregularity within the MCA branches bilaterally. Vertebral arteries are patent to the vertebrobasilar junction. Posterior inferior cerebral arteries patent bilaterally. Basilar artery widely patent. Superior cerebellar arteries opacified proximally. Right P1 and P2 segments widely patent. Fetal origin of the left PCA. Left PCA itself is well opacified. No convincing evidence for aneurysm. Minimal irregularity at the origin of the ophthalmic arteries favored to be artifactual in nature related to motion. IMPRESSION: MRI HEAD IMPRESSION: 1. Acute right holo hemispheric subdural hematoma measuring up to 17 mm in maximal thickness with associated 7 mm of right-to-left shift. No hydrocephalus. 2. Trace subdural blood extending along the falx, tentorium, and overlying the left cerebral hemisphere as well. 3. No other acute intracranial process.  No infarct. 4. Atrophy with moderate chronic small vessel ischemic disease. MRA HEAD IMPRESSION: 1. Negative intracranial MRA. No large or proximal arterial branch occlusion. No correctable or high-grade stenosis. 2. Mild distal branch atheromatous irregularity within the MCA branches bilaterally. Electronically Signed   By: Jeannine Boga M.D.   On: 11/28/2015 00:28     CBC  Recent Labs Lab 11/27/15 2009 11/27/15 2015  WBC 9.6  --   HGB 13.4 14.3  HCT 38.7* 42.0  PLT 182  --   MCV 85.6  --   MCH 29.6  --   MCHC 34.6  --   RDW 12.9  --   LYMPHSABS 1.1  --   MONOABS 0.8  --   EOSABS 0.0  --   BASOSABS 0.0  --     Chemistries   Recent Labs Lab 11/27/15 2009 11/27/15 2015  NA 127* 129*  K 4.2 4.2  CL 91* 92*  CO2 26  --   GLUCOSE 115* 113*  BUN 22* 25*  CREATININE  1.07 1.10  CALCIUM 9.9  --   AST 31  --   ALT 19  --   ALKPHOS 88  --   BILITOT 0.7  --    ------------------------------------------------------------------------------------------------------------------ estimated creatinine clearance is 33.8 mL/min (by C-G formula based on Cr of 1.1). ------------------------------------------------------------------------------------------------------------------ No results for input(s): HGBA1C in the last 72 hours. ------------------------------------------------------------------------------------------------------------------  Recent Labs  11/28/15 0030  CHOL 227*  HDL 58  LDLCALC 149*  TRIG 101  CHOLHDL 3.9   ------------------------------------------------------------------------------------------------------------------ No results for input(s): TSH, T4TOTAL, T3FREE, THYROIDAB in the last 72 hours.  Invalid input(s): FREET3 ------------------------------------------------------------------------------------------------------------------ No results for input(s): VITAMINB12, FOLATE, FERRITIN, TIBC, IRON, RETICCTPCT in the last 72 hours.  Coagulation profile  Recent Labs Lab 11/27/15 2009  INR 1.13    No results for input(s): DDIMER in the last 72 hours.  Cardiac Enzymes No results for input(s): CKMB, TROPONINI, MYOGLOBIN in the last 168 hours.  Invalid input(s): CK ------------------------------------------------------------------------------------------------------------------ Invalid input(s): POCBNP   CBG: No results for input(s): GLUCAP in the last 168 hours.     Studies: Ct Head Wo Contrast  11/28/2015  CLINICAL DATA:  Fall,  subdural/epidural hemorrhage EXAM: CT HEAD WITHOUT CONTRAST TECHNIQUE: Contiguous axial images were obtained from the base of the skull through the vertex without intravenous contrast. COMPARISON:  11/27/2015 FINDINGS: Again noted acute right hemispheric subdural hemorrhage measures 1.5 cm  maximum thickness. There is mild mass effect on the right hemisphere. Again noted about 7 mm right to left midline shift. There  is no intraventricular hemorrhage. Stable small amount of subdural blood along the interhemispheric fissure and tentorium. No new hemorrhage is noted. No definite acute cortical infarction. Stable atrophy and chronic white matter disease P IMPRESSION: Again noted acute right hemispheric subdural hemorrhage measures 1.5 cm maximum thickness. There is mild mass effect on the right hemisphere. Again noted about 7 mm right to left midline shift. There is no intraventricular hemorrhage. Stable small amount of subdural blood along the interhemispheric fissure and tentorium. No new hemorrhage is noted. Electronically Signed   By: Lahoma Crocker M.D.   On: 11/28/2015 11:03   Ct Head Wo Contrast  11/27/2015  CLINICAL DATA:  Right-sided weakness. EXAM: CT HEAD WITHOUT CONTRAST TECHNIQUE: Contiguous axial images were obtained from the base of the skull through the vertex without intravenous contrast. COMPARISON:  PET-CT 05/03/2015 FINDINGS: There is extra-axial hyperdense material along the right frontal cerebral convexity measuring 6 mm in thickness with an area demonstrating a convex margin. There is slight hyperdensity of the left tentorium. There is 2 mm of right-to-left midline shift. There is no evidence of a cortical-based area of acute infarction. There is generalized cerebral atrophy. There is periventricular white matter low attenuation likely secondary to microangiopathy. The ventricles and sulci are appropriate for the patient's age. The basal cisterns are patent. Visualized portions of the orbits are unremarkable. The visualized portions of the paranasal sinuses and mastoid air cells are unremarkable. Cerebrovascular atherosclerotic calcifications are noted. The osseous structures are unremarkable. There is soft tissue swelling along the right frontotemporal calvarium. IMPRESSION: 1.  Extra-axial hyperdense material along the right frontal cerebral convexity measuring 6 mm in thickness likely representing acute subdural hemorrhage, but there is an area demonstrating a convex margin which could reflect a epidural hematoma. Slight hyperdensity of the left tentorium likely representing a small amount of subdural blood. Critical Value/emergent results were called by telephone at the time of interpretation on 11/27/2015 at 8:44 pm to Dr. Aram Beecham, who verbally acknowledged these results. Electronically Signed   By: Kathreen Devoid   On: 11/27/2015 20:45   Mr Jodene Nam Head Wo Contrast  11/28/2015  CLINICAL DATA:  Initial evaluation for acute trauma, fall, transient right leg weakness. EXAM: MRI HEAD WITHOUT CONTRAST MRA HEAD WITHOUT CONTRAST TECHNIQUE: Multiplanar, multiecho pulse sequences of the brain and surrounding structures were obtained without intravenous contrast. Angiographic images of the head were obtained using MRA technique without contrast. COMPARISON:  Prior CT from earlier the same day. FINDINGS: MRI HEAD FINDINGS Study is degraded by motion artifact. Diffuse prominence of the CSF containing spaces is compatible with generalized age-related cerebral atrophy. Patchy and confluent T2/FLAIR hyperintensity within the periventricular and deep white matter both cerebral hemispheres most consistent with chronic small vessel ischemic changes. No evidence for acute infarct. Normal intravascular flow voids are maintained. Acute subdural hematoma overlying the right cerebral convexity again seen, measuring up to 17 mm. Small amount of subdural blood extends along the falx and tentorium. Blood extends around the right cerebellar hemisphere. Trace subdural blood overlies the left cerebral convexity as well. There is associated 7 mm of right-to-left shift. Partial effacement of the right lateral ventricle. Basilar cisterns remain patent. No hydrocephalus. No mass lesion identified. Craniocervical  junction within normal limits. Multilevel degenerative spondylolysis present within the visualized upper cervical spine. Degenerative thickening present within the tectorial membrane. Pituitary gland within normal limits. No acute abnormality about the orbits. Sequelae of prior bilateral lens extraction. Scattered mucosal thickening within the paranasal sinuses. No mastoid effusion. [place]  ear structures within normal limits. Bone marrow signal intensity normal. No scalp soft tissue abnormality. MRA HEAD FINDINGS ANTERIOR CIRCULATION: Visualized distal cervical segments of the internal carotid arteries are widely patent with antegrade flow. The petrous, cavernous, and supraclinoid segments are widely patent. A1 segments, anterior communicating artery common anterior slurred well arteries well opacified bilaterally. M1 segments patent without stenosis or occlusion. MCA bifurcations within normal limits. Distal MCA branches symmetric and well opacified. Distal small vessel atheromatous irregularity within the MCA branches bilaterally. Vertebral arteries are patent to the vertebrobasilar junction. Posterior inferior cerebral arteries patent bilaterally. Basilar artery widely patent. Superior cerebellar arteries opacified proximally. Right P1 and P2 segments widely patent. Fetal origin of the left PCA. Left PCA itself is well opacified. No convincing evidence for aneurysm. Minimal irregularity at the origin of the ophthalmic arteries favored to be artifactual in nature related to motion. IMPRESSION: MRI HEAD IMPRESSION: 1. Acute right holo hemispheric subdural hematoma measuring up to 17 mm in maximal thickness with associated 7 mm of right-to-left shift. No hydrocephalus. 2. Trace subdural blood extending along the falx, tentorium, and overlying the left cerebral hemisphere as well. 3. No other acute intracranial process.  No infarct. 4. Atrophy with moderate chronic small vessel ischemic disease. MRA HEAD IMPRESSION:  1. Negative intracranial MRA. No large or proximal arterial branch occlusion. No correctable or high-grade stenosis. 2. Mild distal branch atheromatous irregularity within the MCA branches bilaterally. Electronically Signed   By: Jeannine Boga M.D.   On: 11/28/2015 00:28   Mr Brain Wo Contrast  11/28/2015  CLINICAL DATA:  Initial evaluation for acute trauma, fall, transient right leg weakness. EXAM: MRI HEAD WITHOUT CONTRAST MRA HEAD WITHOUT CONTRAST TECHNIQUE: Multiplanar, multiecho pulse sequences of the brain and surrounding structures were obtained without intravenous contrast. Angiographic images of the head were obtained using MRA technique without contrast. COMPARISON:  Prior CT from earlier the same day. FINDINGS: MRI HEAD FINDINGS Study is degraded by motion artifact. Diffuse prominence of the CSF containing spaces is compatible with generalized age-related cerebral atrophy. Patchy and confluent T2/FLAIR hyperintensity within the periventricular and deep white matter both cerebral hemispheres most consistent with chronic small vessel ischemic changes. No evidence for acute infarct. Normal intravascular flow voids are maintained. Acute subdural hematoma overlying the right cerebral convexity again seen, measuring up to 17 mm. Small amount of subdural blood extends along the falx and tentorium. Blood extends around the right cerebellar hemisphere. Trace subdural blood overlies the left cerebral convexity as well. There is associated 7 mm of right-to-left shift. Partial effacement of the right lateral ventricle. Basilar cisterns remain patent. No hydrocephalus. No mass lesion identified. Craniocervical junction within normal limits. Multilevel degenerative spondylolysis present within the visualized upper cervical spine. Degenerative thickening present within the tectorial membrane. Pituitary gland within normal limits. No acute abnormality about the orbits. Sequelae of prior bilateral lens  extraction. Scattered mucosal thickening within the paranasal sinuses. No mastoid effusion. Inner ear structures within normal limits. Bone marrow signal intensity normal. No scalp soft tissue abnormality. MRA HEAD FINDINGS ANTERIOR CIRCULATION: Visualized distal cervical segments of the internal carotid arteries are widely patent with antegrade flow. The petrous, cavernous, and supraclinoid segments are widely patent. A1 segments, anterior communicating artery common anterior slurred well arteries well opacified bilaterally. M1 segments patent without stenosis or occlusion. MCA bifurcations within normal limits. Distal MCA branches symmetric and well opacified. Distal small vessel atheromatous irregularity within the MCA branches bilaterally. Vertebral arteries are patent to the vertebrobasilar junction. Posterior inferior  cerebral arteries patent bilaterally. Basilar artery widely patent. Superior cerebellar arteries opacified proximally. Right P1 and P2 segments widely patent. Fetal origin of the left PCA. Left PCA itself is well opacified. No convincing evidence for aneurysm. Minimal irregularity at the origin of the ophthalmic arteries favored to be artifactual in nature related to motion. IMPRESSION: MRI HEAD IMPRESSION: 1. Acute right holo hemispheric subdural hematoma measuring up to 17 mm in maximal thickness with associated 7 mm of right-to-left shift. No hydrocephalus. 2. Trace subdural blood extending along the falx, tentorium, and overlying the left cerebral hemisphere as well. 3. No other acute intracranial process.  No infarct. 4. Atrophy with moderate chronic small vessel ischemic disease. MRA HEAD IMPRESSION: 1. Negative intracranial MRA. No large or proximal arterial branch occlusion. No correctable or high-grade stenosis. 2. Mild distal branch atheromatous irregularity within the MCA branches bilaterally. Electronically Signed   By: Jeannine Boga M.D.   On: 11/28/2015 00:28      No  results found for: HGBA1C Lab Results  Component Value Date   LDLCALC 149* 11/28/2015   CREATININE 1.10 11/27/2015       Scheduled Meds: . calcium-vitamin D  1 tablet Oral Q breakfast  . celecoxib  200 mg Oral Daily  . oxybutynin  5 mg Oral QHS   Continuous Infusions:   Principal Problem:   Epidural hematoma (HCC) Active Problems:   HTN (hypertension)   Frequent falls   Right sided weakness    Time spent: 45 minutes   [facility] Pager [phone]. If 7PM-7AM, please contact night-coverage at [email or web address], password [email or web address] 11/28/2015, 11:53 AM  LOS: 1 day

## 2015-11-28 NOTE — Anesthesia Postprocedure Evaluation (Signed)
Anesthesia Post Note  Patient: [name]  Procedure(s) Performed: Procedure(s) (LRB): CRANIOTOMY HEMATOMA EVACUATION SUBDURAL (Right)  Patient location during evaluation: PACU Anesthesia Type: General Level of consciousness: awake and alert Pain management: pain level controlled Vital Signs Assessment: post-procedure vital signs reviewed and stable Respiratory status: spontaneous breathing, nonlabored ventilation, respiratory function stable and patient connected to nasal cannula oxygen Cardiovascular status: blood pressure returned to baseline and stable Postop Assessment: no signs of nausea or vomiting Anesthetic complications: no    Last Vitals:  Filed Vitals:   11/28/15 2215 11/28/15 2230  BP: 160/62 153/74  Pulse: 60 56  Temp:    Resp: 13 17    Last Pain:  Filed Vitals:   11/28/15 2245  PainSc: 5     LLE Motor Response: Purposeful movement, Responds to commands LLE Sensation: Full sensation RLE Motor Response: Purposeful movement, Responds to commands RLE Sensation: Full sensation      Effie Berkshire

## 2015-11-28 NOTE — Progress Notes (Signed)
@  [id number] paged Lamar Blinks of [facility] for HTN and Headache. Orders received for PRNs and administered. Will continue to monitor.

## 2015-11-28 NOTE — Transfer of Care (Signed)
Immediate Anesthesia Transfer of Care Note  Patient: [name]  Procedure(s) Performed: Procedure(s) with comments: [place] (Right) - CRANIOTOMY HEMATOMA EVACUATION SUBDURAL  Patient Location: PACU  Anesthesia Type:General  Level of Consciousness: sedated  Airway & Oxygen Therapy: Patient Spontanous Breathing and Patient connected to nasal cannula oxygen  Post-op Assessment: Report given to RN and Post -op Vital signs reviewed and stable  Post vital signs: Reviewed and stable  Last Vitals:  Filed Vitals:   11/28/15 1520 11/28/15 1807  BP: 138/59 154/88  Pulse: 69   Temp: 36.6 C 36.8 C  Resp: 16 13    Complications: No apparent anesthesia complications

## 2015-11-28 NOTE — Evaluation (Signed)
Speech Language Pathology Evaluation Patient Details Name: [name] MRN: [id number] DOB: 1922-02-28 Today's Date: 11/28/2015 Time: 1000-1020 SLP Time Calculation (min) (ACUTE ONLY): 20 min  Problem List:  Patient Active Problem List   Diagnosis Date Noted  . Epidural hematoma ([place]) 11/27/2015  . Right sided weakness 11/27/2015  . Frequent falls 11/14/2015  . Routine general medical examination at a health care facility 06/30/2015  . HTN (hypertension) 12/27/2014  . Episodic mood disorder ([place]) 12/27/2014  . Osteoarthritis of spine 04/08/2014  . Malignant neoplasm of prostate ([place]) 08/12/2012  . Memory loss 04/27/2012  . 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 OTH MALIG NEOPLASM SKIN OTH&UNSPEC PARTS FACE 02/10/2009  . Hyperlipemia 08/19/2007  . GERD 08/19/2007   Past Medical History:  Past Medical History  Diagnosis Date  . OTH MALIG NEOPLASM SKIN OTH\T\UNSPEC PARTS FACE 02/10/2009  . HYPERLIPIDEMIA 08/19/2007  . 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 ACTINIC KERATOSIS, EAR, LEFT 06/13/2008  . PROSTATE SPECIFIC ANTIGEN, ELEVATED, CHRONIC 06/15/2010  . History of prostate cancer 2001--  MILD FORM--  NO TX---  FOLLOWED BY DR [facility]  . BPH (benign prostatic hypertrophy)   . Carotid stenosis, asymptomatic BILATERAL MILD ICA STENOSIS   < 50%    PER CAROTID DUPLEX  09-21-2010  . Arthritis   . Colon polyp   . Irritable bowel syndrome   . Prostate cancer metastatic to bone [facility])    Past Surgical History:  Past Surgical History  Procedure Laterality Date  . Total knee arthroplasty  02-11-2006     left knee  oa  . Transurethral resection of prostate  07-29-2000    bph w/ boo  . Lumbar laminotomy and foraminotomy bilaterally  05-12-2007    L2 - 3 ;  SPONDYLOSIS W/ STENOSIS  . Cardiovascular stress test  09-20-2010  DR BENSIMHON    NORMAL NUCLEAR STUDY/ NO ISCHEMIA/ EF 68%  . Prostate biopsy  07/16/2012    Procedure: PROSTATE BIOPSY;   Surgeon: Malka So, MD;  Location: [facility];  Service: Urology;  Laterality: N/A;  WITH ULTRASOUND ([facility] ULTRA SOUND TECH COMING) OWER  . Transurethral resection of prostate  07/16/2012    Procedure: TRANSURETHRAL RESECTION OF THE PROSTATE (TURP);  Surgeon: Malka So, MD;  Location: [facility];  Service: Urology;;  with gyrus   HPI:  [name] is an 81 y.o. maleright handed, with a PMH significant for prostate cancer, HLD, BPH, brought in by EMS due to acute onset of confusion, right leg weakness, and right gaze preference. Pt and wife reported pt was home and sustained a fall a few weeks ago resulted in significant brusing on rigt side of face, now resolved. EMS was called and his symptoms started to improve while being transported to the ED. MRI showed acute subdural hematoma with associated right to left shift.    Assessment / Plan / Recommendation Clinical Impression  Pt presents with cognition WFL, demonstrated adequate long term and short term memory, naming abilities, and executive functioning skills. Pt sustained attention throughout assessment and provided a detailed history. SLP screened pt for swallowing difficulties due to hoarse/wet vocal quality, no overt s/s of aspiration. Pt is at an increased risk of aspiration due to frequent and repeated falls. No SLP cognitive-linguistic or dysphagia treatment is needed, SLP will sign off.     SLP Assessment  Patient does not need any further [facility]  Follow Up Recommendations       Frequency and Duration           SLP Evaluation Prior Functioning  Cognitive/Linguistic Baseline: Within functional limits Type of Home: House  Lives With: Spouse Available Help at Discharge: Family;Available 24 hours/day Vocation: Retired   Associate Professor  Overall Cognitive Status: Within Functional Limits for tasks assessed Arousal/Alertness: Awake/alert Orientation  Level: Oriented X4 Attention: Sustained Sustained Attention: Appears intact Memory: Appears intact Awareness: Appears intact Problem Solving: Appears intact Safety/Judgment: Appears intact    Comprehension  Auditory Comprehension Overall Auditory Comprehension: Appears within functional limits for tasks assessed Yes/No Questions: Within Functional Limits Commands: Within Functional Limits Conversation: Simple Visual Recognition/Discrimination Discrimination: Not tested Reading Comprehension Reading Status: Not tested    Expression Expression Primary Mode of Expression: Verbal Verbal Expression Overall Verbal Expression: Appears within functional limits for tasks assessed Initiation: No impairment Level of Generative/Spontaneous Verbalization: Conversation Naming: No impairment Pragmatics: No impairment Written Expression Written Expression: Not tested   Oral / Motor Oral Motor/Sensory Function Overall Oral Motor/Sensory Function: Within functional limits Motor Speech Overall Motor Speech: Appears within functional limits for tasks assessed Respiration: Within functional limits Phonation: Normal Resonance: Within functional limits Articulation: Within functional limitis Intelligibility: Intelligible Motor Planning: Not tested   Lanier Ensign, Student-SLP  Lanier Ensign 11/28/2015, 11:56 AM

## 2015-11-28 NOTE — Anesthesia Procedure Notes (Signed)
Procedure Name: Intubation Date/Time: 11/28/2015 7:17 PM Performed by: Trixie Deis A Pre-anesthesia Checklist: Patient identified, Timeout performed, Emergency Drugs available, Suction available and Patient being monitored Patient Re-evaluated:Patient Re-evaluated prior to inductionOxygen Delivery Method: Circle system utilized Preoxygenation: Pre-oxygenation with 100% oxygen Intubation Type: IV induction Ventilation: Mask ventilation without difficulty Laryngoscope Size: Mac and 4 Grade View: Grade I Tube type: Subglottic suction tube Tube size: 7.5 mm Number of attempts: 1 Airway Equipment and Method: Stylet Placement Confirmation: ETT inserted through vocal cords under direct vision,  breath sounds checked- equal and bilateral and positive ETCO2 Secured at: 23 cm Tube secured with: Tape Dental Injury: Teeth and Oropharynx as per pre-operative assessment

## 2015-11-28 NOTE — Progress Notes (Signed)
  Echocardiogram 2D Echocardiogram has been performed.  Fred Franzen 11/28/2015, 3:02 PM

## 2015-11-28 NOTE — Progress Notes (Signed)
UR COMPLETED  

## 2015-11-28 NOTE — Brief Op Note (Signed)
11/27/2015 - 11/28/2015  8:24 PM  PATIENT:  [name]  81 y.o. male  PRE-OPERATIVE DIAGNOSIS:  Right subdural hematoma  POST-OPERATIVE DIAGNOSIS:  Right subdural hematoma  PROCEDURE:  Procedure(s) with comments: CRANIOTOMY HEMATOMA EVACUATION SUBDURAL (Right) - CRANIOTOMY HEMATOMA EVACUATION SUBDURAL  SURGEON:  Surgeon(s) and Role:    * Earnie Larsson, MD - Primary  PHYSICIAN ASSISTANT:   ASSISTANTS: none   ANESTHESIA:   general  EBL:  Total I/O In: 1000 [I.V.:1000] Out: 425 [Urine:275; Blood:150]  BLOOD ADMINISTERED:none  DRAINS: (10 mm) Jackson-Pratt drain(s) with closed bulb suction in the Subdural space   LOCAL MEDICATIONS USED:  LIDOCAINE   SPECIMEN:  No Specimen  DISPOSITION OF SPECIMEN:  N/A  COUNTS:  YES  TOURNIQUET:  * No tourniquets in log *  DICTATION: .Dragon Dictation  PLAN OF CARE: Admit to inpatient   PATIENT DISPOSITION:  PACU - hemodynamically stable.   Delay start of Pharmacological VTE agent (>24hrs) due to surgical blood loss or risk of bleeding: yes

## 2015-11-28 NOTE — Anesthesia Preprocedure Evaluation (Addendum)
Anesthesia Evaluation  Patient identified by MRN, date of birth, ID band Patient awake    Reviewed: Allergy & Precautions, NPO status , Patient's Chart, lab work & pertinent test results  Airway Mallampati: II  TM Distance: >3 FB Neck ROM: Full    Dental  (+) Teeth Intact   Pulmonary neg pulmonary ROS,    breath sounds clear to auscultation       Cardiovascular hypertension, + Peripheral Vascular Disease   Rhythm:Regular Rate:Normal     Neuro/Psych PSYCHIATRIC DISORDERS negative neurological ROS     GI/Hepatic Neg liver ROS, GERD  ,  Endo/Other  negative endocrine ROS  Renal/GU negative Renal ROS  negative genitourinary   Musculoskeletal  (+) Arthritis ,   Abdominal   Peds negative pediatric ROS (+)  Hematology negative hematology ROS (+)   Anesthesia Other Findings   Reproductive/Obstetrics negative OB ROS                            Lab Results  Component Value Date   WBC 9.6 11/27/2015   HGB 14.3 11/27/2015   HCT 42.0 11/27/2015   MCV 85.6 11/27/2015   PLT 182 11/27/2015   Lab Results  Component Value Date   CREATININE 1.10 11/27/2015   BUN 25* 11/27/2015   NA 129* 11/27/2015   K 4.2 11/27/2015   CL 92* 11/27/2015   CO2 26 11/27/2015  ; Lab Results  Component Value Date   INR 1.13 11/27/2015   Echo:  - Normal LV systolic function; grade 1 diastolic dysfunction with elevated LV filling pressure; mild LVH with proximal septal thickening; intracavitary gradient of 2 m/s; mild LAE; calcified aortic valve with no AS by gradient.   Anesthesia Physical Anesthesia Plan  ASA: III  Anesthesia Plan: General   Post-op Pain Management:    Induction: Intravenous  Airway Management Planned: Oral ETT  Additional Equipment: Arterial line  Intra-op Plan:   Post-operative Plan: Extubation in OR  Informed Consent: I have reviewed the patients History and Physical,  chart, labs and discussed the procedure including the risks, benefits and alternatives for the proposed anesthesia with the patient or authorized representative who has indicated his/her understanding and acceptance.   Dental advisory given  Plan Discussed with: CRNA  Anesthesia Plan Comments:         Anesthesia Quick Evaluation

## 2015-11-28 NOTE — Op Note (Signed)
Date of procedure: 11/28/2015  Date of dictation: Same  Service: Neurosurgery  Preoperative diagnosis: Right acute posttraumatic subdural hematoma  Postoperative diagnosis: Same  Procedure Name: Right frontal temporal parietal craniotomy and evacuation of subdural hematoma  Surgeon:Oliviagrace Crisanti A.Terria Deschepper, M.D.  Asst. Surgeon: None  Anesthesia: General  Indication: 81 year old male with probable fall yesterday. Progressive left-sided weakness. Initial workup with very small right convexity subdural hematoma which enlarged with serial head CT scans. Patient presents now for evacuation of subdural hematoma  Operative note: After induction of anesthesia, patient positioned with her head turned toward the left. He was appropriately padded. Scalp prepped and draped sterilely. Curvilinear incision made extending from the zygoma to his right frontal region. Incision carried down to the paracranium above and the temporalis 5 showed below. Temporalis fascia and muscle divided and mobilized anteriorly with the scalp flap. Right frontal temporal parietal craniotomy performed using the high-speed drill. Bone flap opened. Dura incised and flapped along the floor of the anterior and middle fossa. Subdural blood under pressure identified. Subdural hematoma evacuated with gentle suction and irrigation. Small surface bleeder on the cortex of the right inferior parietal region identified. Coagulated with good hemostasis. No evidence of any further active bleeding. Blood clot completely evacuated. Field observed and found to remain dry areas 10 mm flat JP drain left in the subdural space and exited through a separate stab incision. Dura loosely reapproximated. Gelfoam placed over craniotomy defect. Skull flap reapproximated using OsteoMed plates. Wound irrigated one final time and then closed in layers with the temporalis fascia and galea. Staples applied to the surface. No apparent complications. Patient tolerated the  procedure well and he returns to the recovery room postop.

## 2015-11-29 ENCOUNTER — Encounter (HOSPITAL_COMMUNITY): Payer: Self-pay | Admitting: Radiology

## 2015-11-29 ENCOUNTER — Inpatient Hospital Stay (HOSPITAL_COMMUNITY): Payer: Medicare Other

## 2015-11-29 DIAGNOSIS — S064X9A Epidural hemorrhage with loss of consciousness of unspecified duration, initial encounter: Secondary | ICD-10-CM

## 2015-11-29 DIAGNOSIS — S069X4S Unspecified intracranial injury with loss of consciousness of 6 hours to 24 hours, sequela: Secondary | ICD-10-CM

## 2015-11-29 LAB — COMPREHENSIVE METABOLIC PANEL
ALT: 14 U/L — AB (ref 17–63)
AST: 36 U/L (ref 15–41)
Albumin: 3.5 g/dL (ref 3.5–5.0)
Alkaline Phosphatase: 61 U/L (ref 38–126)
Anion gap: 6 (ref 5–15)
BILIRUBIN TOTAL: 1.1 mg/dL (ref 0.3–1.2)
BUN: 18 mg/dL (ref 6–20)
CALCIUM: 8.8 mg/dL — AB (ref 8.9–10.3)
CO2: 24 mmol/L (ref 22–32)
CREATININE: 0.85 mg/dL (ref 0.61–1.24)
Chloride: 99 mmol/L — ABNORMAL LOW (ref 101–111)
GFR calc Af Amer: >60 mL/min (ref >60)
Glucose, Bld: 119 mg/dL — ABNORMAL HIGH (ref 65–99)
Potassium: 4.6 mmol/L (ref 3.5–5.1)
Sodium: 129 mmol/L — ABNORMAL LOW (ref 135–145)
TOTAL PROTEIN: 5.9 g/dL — AB (ref 6.5–8.1)

## 2015-11-29 LAB — CBC
HEMATOCRIT: 31.7 % — AB (ref 39.0–52.0)
Hemoglobin: 10.8 g/dL — ABNORMAL LOW (ref 13.0–17.0)
MCH: 29.2 pg (ref 26.0–34.0)
MCHC: 34.1 g/dL (ref 30.0–36.0)
MCV: 85.7 fL (ref 78.0–100.0)
Platelets: 174 10*3/uL (ref 150–400)
RBC: 3.7 MIL/uL — ABNORMAL LOW (ref 4.22–5.81)
RDW: 13.4 % (ref 11.5–15.5)
WBC: 12.5 10*3/uL — AB (ref 4.0–10.5)

## 2015-11-29 LAB — HEMOGLOBIN A1C
Hgb A1c MFr Bld: 5.7 % — ABNORMAL HIGH (ref 4.8–5.6)
Mean Plasma Glucose: 117 mg/dL

## 2015-11-29 NOTE — Progress Notes (Signed)
Pt uncomfortable, unable to void. Bladder scan amount is 460, In and out cath done. Output 500

## 2015-11-29 NOTE — Consult Note (Signed)
Physical Medicine and Rehabilitation Consult  Reason for Consult: TBI Referring Physician: Dr. Annette Stable    HPI: [name] is a 81 y.o. 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 CT head done revealing right frontal epidural hemorrhage and small amount of SDH. MRI brain done revealing acute right hemisphere SDH 17 mm thickness with associated 7 mm right to left shift and trace subdural blood extending along falx, tentorium and overlying left cerebral hemisphere. MRA brain negative for occlusion or stenosis.  Serial CCT done and patient was taken to OR for right fronto-temporal parietal craniotomy for evacuation of SDH by Dr. Annette Stable on 11/29 due to of enlargement bleed. PT evaluation done today and CIR recommended for follow up therapy.    Review of Systems  Unable to perform ROS: mental acuity  HENT: Positive for hearing loss (hearing aids on order).   Respiratory: Negative for cough and shortness of breath.   Cardiovascular: Negative for chest pain.  Gastrointestinal: Positive for abdominal pain. Negative for heartburn.  Genitourinary: Positive for frequency.  Musculoskeletal: Positive for back pain and falls (Has weak legs per wife).  Neurological: Positive for headaches.  Psychiatric/Behavioral: Positive for memory loss (recently). The patient does not have insomnia.       Past Medical History  Diagnosis Date  . OTH MALIG NEOPLASM SKIN OTH\T\UNSPEC PARTS FACE 02/10/2009  . HYPERLIPIDEMIA 08/19/2007  . 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 ACTINIC KERATOSIS, EAR, LEFT 06/13/2008  . PROSTATE SPECIFIC ANTIGEN, ELEVATED, CHRONIC 06/15/2010  . History of prostate cancer 2001--  MILD FORM--  NO TX---  FOLLOWED BY DR [facility]  . BPH (benign prostatic hypertrophy)   . Carotid  stenosis, asymptomatic BILATERAL MILD ICA STENOSIS   < 50%    PER CAROTID DUPLEX  09-21-2010  . Arthritis   . Colon polyp   . Irritable bowel syndrome   . Prostate cancer metastatic to bone [facility])     Past Surgical History  Procedure Laterality Date  . Total knee arthroplasty  02-11-2006     left knee  oa  . Transurethral resection of prostate  07-29-2000    bph w/ boo  . Lumbar laminotomy and foraminotomy bilaterally  05-12-2007    L2 - 3 ;  SPONDYLOSIS W/ STENOSIS  . Cardiovascular stress test  09-20-2010  DR BENSIMHON    NORMAL NUCLEAR STUDY/ NO ISCHEMIA/ EF 68%  . Prostate biopsy  07/16/2012    Procedure: PROSTATE BIOPSY;  Surgeon: Malka So, MD;  Location: [facility];  Service: Urology;  Laterality: N/A;  WITH ULTRASOUND ([facility] ULTRA SOUND TECH COMING) OWER  . Transurethral resection of prostate  07/16/2012    Procedure: TRANSURETHRAL RESECTION OF THE PROSTATE (TURP);  Surgeon: Malka So, MD;  Location: [facility];  Service: Urology;;  with gyrus    Family History  Problem Relation Age of Onset  . Arthritis Mother   . Cancer Father   . Prostate cancer Father   . Colon cancer Mother     Died at age 73  . Colon polyps Neg Hx     Social History:  Married but attempting to file for separation from wife. 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  Per reports that he has never smoked. He has  never used smokeless tobacco. Per reports that he does not drink alcohol or use illicit drugs.    Allergies: No Known Allergies    Medications Prior to Admission  Medication Sig Dispense Refill  . calcium-vitamin D (OSCAL WITH D) 500-200 MG-UNIT tablet Take 1 tablet by mouth daily with breakfast.    . carisoprodol (SOMA) 350 MG tablet Take 0.5-1 tablets (175-350 mg total) by mouth daily as needed. (Patient taking differently: Take 175-350 mg by mouth daily as needed for muscle spasms. ) 90 tablet 3  . celecoxib (CELEBREX) 200 MG capsule  Take 1 capsule (200 mg total) by mouth daily. 90 capsule 3  . diazepam (VALIUM) 5 MG tablet Take 1 tablet (5 mg total) by mouth every 12 (twelve) hours as needed for anxiety. 60 tablet 1  . hydrOXYzine (ATARAX/VISTARIL) 10 MG tablet Take 10 mg by mouth 3 (three) times daily as needed for itching. Take two daily    . leuprolide (LUPRON) 3.75 MG injection Inject 3.75 mg into the muscle once. As needed based on PSA    . oxybutynin (DITROPAN-XL) 5 MG 24 hr tablet Take 5 mg by mouth at bedtime.     . donepezil (ARICEPT) 5 MG tablet Take 1 tablet (5 mg total) by mouth at bedtime. (Patient not taking: Reported on 10/30/2015) 90 tablet 1    Home: [place] expects to be discharged to:: Inpatient rehab Living Arrangements: Spouse/significant other Available Help at Discharge: Family, Available 24 hours/day Type of Home: House Additional Comments: pt has a wife, but per RN he indicates he has been trying to seperate from her for about a year now.    Lives With: Spouse  Functional History: Prior Function Level of Independence: Independent Functional Status:  Mobility: Bed Mobility Overal bed mobility: Needs Assistance, +2 for physical assistance Bed Mobility: Supine to Sit Supine to sit: Mod assist, +2 for physical assistance, HOB elevated General bed mobility comments: cues for sequencing and A for trunk and Bil LEs.   Transfers Overall transfer level: Needs assistance Equipment used: 2 person hand held assist Transfers: Sit to/from Stand, Stand Pivot Transfers Sit to Stand: Mod assist, +2 physical assistance Stand pivot transfers: Max assist, +2 physical assistance General transfer comment: pt needs cues for UE use and staying on task.  Increased A for weight shifting to take step towards L side and to complete pivot.        ADL:    Cognition: Cognition Overall Cognitive Status: No family/caregiver present to determine baseline cognitive  functioning Arousal/Alertness: Awake/alert Orientation Level: Oriented X4 (intermittent times of confusion) Attention: Sustained Sustained Attention: Appears intact Memory: Appears intact Awareness: Appears intact Problem Solving: Appears intact Safety/Judgment: Appears intact Cognition Arousal/Alertness: Awake/alert Behavior During Therapy: Impulsive Overall Cognitive Status: No family/caregiver present to determine baseline cognitive functioning General Comments: pt is oriented, but is perseverating on getting his antacids out of the top drawer and indicating towards the door.  Multiple attemtps to orient pt to there not being anything by the door and that the RN would have his meds, but throughout session pt continued to talk about antacids.  RN aware.     Blood pressure 142/70, pulse 64, temperature 97.5 F (36.4 C), temperature source Oral, resp. rate 13, height 5\' 2"  (1.575 m), weight 63.4 kg (139 lb 12.4 oz), SpO2 98 %. Physical Exam  Nursing note and vitals reviewed. Constitutional: He is oriented to person, place, and time. He appears well-developed and well-nourished. He appears lethargic. He is easily  aroused.  Right scalp with drain.   HENT:  Head: Normocephalic.  Mouth/Throat: Oropharynx is clear and moist.  Right scalp with dry dressing and drain with bloody drainage.  Min edema right temple.   Eyes: Conjunctivae are normal. Pupils are equal, round, and reactive to light.  Neck: Normal range of motion.  Cardiovascular: Normal rate and regular rhythm.   Respiratory: Effort normal and breath sounds normal. No respiratory distress. He has no wheezes.  GI: Soft. Bowel sounds are normal. He exhibits no distension. There is no tenderness.  Musculoskeletal: He exhibits no edema or tenderness.  Neurological: He is oriented to person, place, and time and easily aroused. He appears lethargic.  Extremely lethargic,Arouses briefly.  Delayed processing with delayed answers.  He was  able to move all extremities.   Skin: Skin is warm and dry.    Results for orders placed or performed during the hospital encounter of 11/27/15 (from the past 24 hour(s))  Type and screen     Status: None   Collection Time: 11/28/15  6:45 PM  Result Value Ref Range   ABO/RH(D) O POS    Antibody Screen NEG    Sample Expiration 12/01/2015   CBC     Status: Abnormal   Collection Time: 11/29/15  4:25 AM  Result Value Ref Range   WBC 12.5 (H) 4.0 - 10.5 K/uL   RBC 3.70 (L) 4.22 - 5.81 MIL/uL   Hemoglobin 10.8 (L) 13.0 - 17.0 g/dL   HCT 31.7 (L) 39.0 - 52.0 %   MCV 85.7 78.0 - 100.0 fL   MCH 29.2 26.0 - 34.0 pg   MCHC 34.1 30.0 - 36.0 g/dL   RDW 13.4 11.5 - 15.5 %   Platelets 174 150 - 400 K/uL  Comprehensive metabolic panel     Status: Abnormal   Collection Time: 11/29/15  4:25 AM  Result Value Ref Range   Sodium 129 (L) 135 - 145 mmol/L   Potassium 4.6 3.5 - 5.1 mmol/L   Chloride 99 (L) 101 - 111 mmol/L   CO2 24 22 - 32 mmol/L   Glucose, Bld 119 (H) 65 - 99 mg/dL   BUN 18 6 - 20 mg/dL   Creatinine, Ser 0.85 0.61 - 1.24 mg/dL   Calcium 8.8 (L) 8.9 - 10.3 mg/dL   Total Protein 5.9 (L) 6.5 - 8.1 g/dL   Albumin 3.5 3.5 - 5.0 g/dL   AST 36 15 - 41 U/L   ALT 14 (L) 17 - 63 U/L   Alkaline Phosphatase 61 38 - 126 U/L   Total Bilirubin 1.1 0.3 - 1.2 mg/dL   GFR calc non Af Amer >60 >60 mL/min   GFR calc Af Amer >60 >60 mL/min   Anion gap 6 5 - 15   Ct Head Wo Contrast  11/29/2015  CLINICAL DATA:  Follow-up subdural hemorrhage status post craniotomy and evacuation. EXAM: CT HEAD WITHOUT CONTRAST TECHNIQUE: Contiguous axial images were obtained from the base of the skull through the vertex without intravenous contrast. COMPARISON:  Prior CT from 11/28/2015. FINDINGS: Postoperative changes from interval right frontotemporal craniotomy for evacuation of a right-sided subdural hematoma are seen. A subdural drain remains in place. Extra-axial pneumocephalus overlies the bilateral frontal  lobes, right greater than left. Previously seen large right subdural hematoma has largely been evacuated, with only small volume blood products now seen within the right extra-axial space. Small amount of hemorrhage likely persist along the falx and tentorium. Previously seen midline shift is markedly improved,  now measuring 1-2 mm from right-to-left. No hydrocephalus. Basilar cisterns remain patent. Small volume acute scattered subarachnoid hemorrhage present within the posterior frontal regions bilaterally at the superior aspect of the sylvian fissures. Trace subarachnoid within the interpeduncular cistern. Degenerating blood products seen layering within the occipital horn of the right lateral ventricle, likely related to redistribution. Atrophy with chronic small vessel ischemic disease again noted. No acute large vessel territory infarct. Skin staples at the right frontotemporal scalp with associated swelling and soft tissue emphysema. Globes and orbits demonstrate no acute abnormality. Scattered mucosal thickening within the ethmoidal air cells and maxillary sinuses. No mastoid effusion. IMPRESSION: 1. Postoperative changes from interval right frontotemporal craniotomy for evacuation of right subdural hematoma. No complication. The right subdural has largely been evacuated with only trace blood products now evident within the right extra-axial space. 2. Improved right-to-left shift, now measuring 1-2 mm. 3. Scattered acute small volume subarachnoid hemorrhage with small volume intraventricular hemorrhage. No hydrocephalus. Electronically Signed   By: Jeannine Boga M.D.   On: 11/29/2015 06:51   Ct Head Wo Contrast  11/28/2015  CLINICAL DATA:  Fall,  subdural/epidural hemorrhage EXAM: CT HEAD WITHOUT CONTRAST TECHNIQUE: Contiguous axial images were obtained from the base of the skull through the vertex without intravenous contrast. COMPARISON:  11/27/2015 FINDINGS: Again noted acute right hemispheric  subdural hemorrhage measures 1.5 cm maximum thickness. There is mild mass effect on the right hemisphere. Again noted about 7 mm right to left midline shift. There is no intraventricular hemorrhage. Stable small amount of subdural blood along the interhemispheric fissure and tentorium. No new hemorrhage is noted. No definite acute cortical infarction. Stable atrophy and chronic white matter disease P IMPRESSION: Again noted acute right hemispheric subdural hemorrhage measures 1.5 cm maximum thickness. There is mild mass effect on the right hemisphere. Again noted about 7 mm right to left midline shift. There is no intraventricular hemorrhage. Stable small amount of subdural blood along the interhemispheric fissure and tentorium. No new hemorrhage is noted. Electronically Signed   By: Lahoma Crocker M.D.   On: 11/28/2015 11:03   Ct Head Wo Contrast  11/27/2015  CLINICAL DATA:  Right-sided weakness. EXAM: CT HEAD WITHOUT CONTRAST TECHNIQUE: Contiguous axial images were obtained from the base of the skull through the vertex without intravenous contrast. COMPARISON:  PET-CT 05/03/2015 FINDINGS: There is extra-axial hyperdense material along the right frontal cerebral convexity measuring 6 mm in thickness with an area demonstrating a convex margin. There is slight hyperdensity of the left tentorium. There is 2 mm of right-to-left midline shift. There is no evidence of a cortical-based area of acute infarction. There is generalized cerebral atrophy. There is periventricular white matter low attenuation likely secondary to microangiopathy. The ventricles and sulci are appropriate for the patient's age. The basal cisterns are patent. Visualized portions of the orbits are unremarkable. The visualized portions of the paranasal sinuses and mastoid air cells are unremarkable. Cerebrovascular atherosclerotic calcifications are noted. The osseous structures are unremarkable. There is soft tissue swelling along the right  frontotemporal calvarium. IMPRESSION: 1. Extra-axial hyperdense material along the right frontal cerebral convexity measuring 6 mm in thickness likely representing acute subdural hemorrhage, but there is an area demonstrating a convex margin which could reflect a epidural hematoma. Slight hyperdensity of the left tentorium likely representing a small amount of subdural blood. Critical Value/emergent results were called by telephone at the time of interpretation on 11/27/2015 at 8:44 pm to Dr. Aram Beecham, who verbally acknowledged these results. Electronically Signed  By: Kathreen Devoid   On: 11/27/2015 20:45   Mr Mra Head Wo Contrast  11/28/2015  CLINICAL DATA:  Initial evaluation for acute trauma, fall, transient right leg weakness. EXAM: MRI HEAD WITHOUT CONTRAST MRA HEAD WITHOUT CONTRAST TECHNIQUE: Multiplanar, multiecho pulse sequences of the brain and surrounding structures were obtained without intravenous contrast. Angiographic images of the head were obtained using MRA technique without contrast. COMPARISON:  Prior CT from earlier the same day. FINDINGS: MRI HEAD FINDINGS Study is degraded by motion artifact. Diffuse prominence of the CSF containing spaces is compatible with generalized age-related cerebral atrophy. Patchy and confluent T2/FLAIR hyperintensity within the periventricular and deep white matter both cerebral hemispheres most consistent with chronic small vessel ischemic changes. No evidence for acute infarct. Normal intravascular flow voids are maintained. Acute subdural hematoma overlying the right cerebral convexity again seen, measuring up to 17 mm. Small amount of subdural blood extends along the falx and tentorium. Blood extends around the right cerebellar hemisphere. Trace subdural blood overlies the left cerebral convexity as well. There is associated 7 mm of right-to-left shift. Partial effacement of the right lateral ventricle. Basilar cisterns remain patent. No hydrocephalus. No mass  lesion identified. Craniocervical junction within normal limits. Multilevel degenerative spondylolysis present within the visualized upper cervical spine. Degenerative thickening present within the tectorial membrane. Pituitary gland within normal limits. No acute abnormality about the orbits. Sequelae of prior bilateral lens extraction. Scattered mucosal thickening within the paranasal sinuses. No mastoid effusion. Inner ear structures within normal limits. Bone marrow signal intensity normal. No scalp soft tissue abnormality. MRA HEAD FINDINGS ANTERIOR CIRCULATION: Visualized distal cervical segments of the internal carotid arteries are widely patent with antegrade flow. The petrous, cavernous, and supraclinoid segments are widely patent. A1 segments, anterior communicating artery common anterior slurred well arteries well opacified bilaterally. M1 segments patent without stenosis or occlusion. MCA bifurcations within normal limits. Distal MCA branches symmetric and well opacified. Distal small vessel atheromatous irregularity within the MCA branches bilaterally. Vertebral arteries are patent to the vertebrobasilar junction. Posterior inferior cerebral arteries patent bilaterally. Basilar artery widely patent. Superior cerebellar arteries opacified proximally. Right P1 and P2 segments widely patent. Fetal origin of the left PCA. Left PCA itself is well opacified. No convincing evidence for aneurysm. Minimal irregularity at the origin of the ophthalmic arteries favored to be artifactual in nature related to motion. IMPRESSION: MRI HEAD IMPRESSION: 1. Acute right holo hemispheric subdural hematoma measuring up to 17 mm in maximal thickness with associated 7 mm of right-to-left shift. No hydrocephalus. 2. Trace subdural blood extending along the falx, tentorium, and overlying the left cerebral hemisphere as well. 3. No other acute intracranial process.  No infarct. 4. Atrophy with moderate chronic small vessel  ischemic disease. MRA HEAD IMPRESSION: 1. Negative intracranial MRA. No large or proximal arterial branch occlusion. No correctable or high-grade stenosis. 2. Mild distal branch atheromatous irregularity within the MCA branches bilaterally. Electronically Signed   By: Jeannine Boga M.D.   On: 11/28/2015 00:28   Mr Brain Wo Contrast  11/28/2015  CLINICAL DATA:  Initial evaluation for acute trauma, fall, transient right leg weakness. EXAM: MRI HEAD WITHOUT CONTRAST MRA HEAD WITHOUT CONTRAST TECHNIQUE: Multiplanar, multiecho pulse sequences of the brain and surrounding structures were obtained without intravenous contrast. Angiographic images of the head were obtained using MRA technique without contrast. COMPARISON:  Prior CT from earlier the same day. FINDINGS: MRI HEAD FINDINGS Study is degraded by motion artifact. Diffuse prominence of the CSF containing spaces is  compatible with generalized age-related cerebral atrophy. Patchy and confluent T2/FLAIR hyperintensity within the periventricular and deep white matter both cerebral hemispheres most consistent with chronic small vessel ischemic changes. No evidence for acute infarct. Normal intravascular flow voids are maintained. Acute subdural hematoma overlying the right cerebral convexity again seen, measuring up to 17 mm. Small amount of subdural blood extends along the falx and tentorium. Blood extends around the right cerebellar hemisphere. Trace subdural blood overlies the left cerebral convexity as well. There is associated 7 mm of right-to-left shift. Partial effacement of the right lateral ventricle. Basilar cisterns remain patent. No hydrocephalus. No mass lesion identified. Craniocervical junction within normal limits. Multilevel degenerative spondylolysis present within the visualized upper cervical spine. Degenerative thickening present within the tectorial membrane. Pituitary gland within normal limits. No acute abnormality about the orbits.  Sequelae of prior bilateral lens extraction. Scattered mucosal thickening within the paranasal sinuses. No mastoid effusion. Inner ear structures within normal limits. Bone marrow signal intensity normal. No scalp soft tissue abnormality. MRA HEAD FINDINGS ANTERIOR CIRCULATION: Visualized distal cervical segments of the internal carotid arteries are widely patent with antegrade flow. The petrous, cavernous, and supraclinoid segments are widely patent. A1 segments, anterior communicating artery common anterior slurred well arteries well opacified bilaterally. M1 segments patent without stenosis or occlusion. MCA bifurcations within normal limits. Distal MCA branches symmetric and well opacified. Distal small vessel atheromatous irregularity within the MCA branches bilaterally. Vertebral arteries are patent to the vertebrobasilar junction. Posterior inferior cerebral arteries patent bilaterally. Basilar artery widely patent. Superior cerebellar arteries opacified proximally. Right P1 and P2 segments widely patent. Fetal origin of the left PCA. Left PCA itself is well opacified. No convincing evidence for aneurysm. Minimal irregularity at the origin of the ophthalmic arteries favored to be artifactual in nature related to motion. IMPRESSION: MRI HEAD IMPRESSION: 1. Acute right holo hemispheric subdural hematoma measuring up to 17 mm in maximal thickness with associated 7 mm of right-to-left shift. No hydrocephalus. 2. Trace subdural blood extending along the falx, tentorium, and overlying the left cerebral hemisphere as well. 3. No other acute intracranial process.  No infarct. 4. Atrophy with moderate chronic small vessel ischemic disease. MRA HEAD IMPRESSION: 1. Negative intracranial MRA. No large or proximal arterial branch occlusion. No correctable or high-grade stenosis. 2. Mild distal branch atheromatous irregularity within the MCA branches bilaterally. Electronically Signed   By: Jeannine Boga M.D.   On:  11/28/2015 00:28    Assessment/Plan: Diagnosis: Traumatic brain injury after fall 1. Does the need for close, 24 hr/day medical supervision in concert with the patient's rehab needs make it unreasonable for this patient to be served in a less intensive setting? Yes and Potentially 2. Co-Morbidities requiring supervision/potential complications: gait disorder, htn 3. Due to bladder management, bowel management, safety, skin/wound care, disease management, medication administration, pain management and patient education, does the patient require 24 hr/day rehab nursing? Yes 4. Does the patient require coordinated care of a physician, rehab nurse, PT (1-2 hrs/day, 5 days/week), OT (1-2 hrs/day, 5 days/week) and SLP (1-2 hrs/day, 5 days/week) to address physical and functional deficits in the context of the above medical diagnosis(es)? Potentially Addressing deficits in the following areas: balance, endurance, locomotion, strength, transferring, bowel/bladder control, bathing, dressing, feeding, grooming, toileting, cognition, speech, language, swallowing and psychosocial support 5. Can the patient actively participate in an intensive therapy program of at least 3 hrs of therapy per day at least 5 days per week? Potentially 6. The potential for patient to make  measurable gains while on inpatient rehab is good and fair 7. Anticipated functional outcomes upon discharge from inpatient rehab are min assist  with PT, min assist with OT, supervision and min assist with SLP. 8. Estimated rehab length of stay to reach the above functional goals is: potentially 18-24 days 9. Does the patient have adequate social supports and living environment to accommodate these discharge functional goals? Potentially 10. Anticipated D/C setting: Home 11. Anticipated post D/C treatments: HH therapy and Outpatient therapy 12. Overall Rehab/Functional Prognosis: good and fair  RECOMMENDATIONS: This patient's condition is  appropriate for continued rehabilitative care in the following setting: likely inpatient rehab Patient has agreed to participate in recommended program. N/A Note that insurance prior authorization may be required for reimbursement for recommended care.  Comment: wife was present---she's in a w/c herself. He will need other family to assist him at home once discharge. Need to establish other caregivers. Rehab Admissions Coordinator to follow up.  Thanks,  Meredith Staggers, MD, Mellody Drown     11/29/2015

## 2015-11-29 NOTE — Progress Notes (Signed)
Postop day 1.  No problems overnight. Patient complains of mild headache no other complaints.  Afebrile. Vitals are stable. Awakens easily. Appears alert and aware. Speech reasonably fluent. Patient mildly confused as to his situation but intact to person place and time. Cranial nerve function intact. Motor examination improved. Perhaps a trace of pronator drift on the left but much better than preop. Dressing clean and dry. Drain output bloody.  Follow-up head CT scan demonstrates resolution of right subdural hematoma. Still a significant amount of surrounding pneumocephalus. No midline shift much improved.  Doing well postoperatively. Mobilize today. Continue subdural drain. Continue ICU observation.

## 2015-11-29 NOTE — Evaluation (Signed)
Physical Therapy Evaluation Patient Details Name: [name] MRN: [id number] DOB: 05-Sep-1922 Today's Date: 11/29/2015   History of Present Illness  pt presents with R Frontotemporoparietal SDH now s/p Crani and evacuation.  pt with hx of skin CA, Prostate CA with Bone Mets, L TKA, and Back Surgery.    Clinical Impression  Pt generally oriented, but perseverating about antacids being in the top drawer over by the door, but no drawers near the door.  Multiple attempts to re-direct, but difficulty attending to task.  Feel pt would benefit from CIR at D/C to maximize independence and decrease burden of care prior to returning to home.  Will continue to follow.      Follow Up Recommendations CIR    Equipment Recommendations  None recommended by PT    Recommendations for Other Services Rehab consult     Precautions / Restrictions Precautions Precautions: Fall Restrictions Weight Bearing Restrictions: No      Mobility  Bed Mobility Overal bed mobility: Needs Assistance;+2 for physical assistance Bed Mobility: Supine to Sit     Supine to sit: Mod assist;+2 for physical assistance;HOB elevated     General bed mobility comments: cues for sequencing and A for trunk and Bil LEs.    Transfers Overall transfer level: Needs assistance Equipment used: 2 person hand held assist Transfers: Sit to/from [facility] Sit to Stand: Mod assist;+2 physical assistance Stand pivot transfers: Max assist;+2 physical assistance       General transfer comment: pt needs cues for UE use and staying on task.  Increased A for weight shifting to take step towards L side and to complete pivot.    Ambulation/Gait                Stairs            Wheelchair Mobility    Modified Rankin (Stroke Patients Only)       Balance Overall balance assessment: Needs assistance;History of Falls Sitting-balance support: Bilateral upper extremity supported;Feet  supported Sitting balance-Leahy Scale: Fair     Standing balance support: During functional activity Standing balance-Leahy Scale: Poor                               Pertinent Vitals/Pain Pain Assessment: No/denies pain    Home Living Family/patient expects to be discharged to:: Inpatient rehab Living Arrangements: Spouse/significant other               Additional Comments: pt has a wife, but per RN he indicates he has been trying to seperate from her for about a year now.      Prior Function Level of Independence: Independent               Hand Dominance        Extremity/Trunk Assessment   Upper Extremity Assessment: Defer to OT evaluation           Lower Extremity Assessment: Generalized weakness      Cervical / Trunk Assessment: Kyphotic  Communication   Communication: No difficulties  Cognition Arousal/Alertness: Awake/alert Behavior During Therapy: Impulsive Overall Cognitive Status: No family/caregiver present to determine baseline cognitive functioning                 General Comments: pt is oriented, but is perseverating on getting his antacids out of the top drawer and indicating towards the door.  Multiple attemtps to orient pt to there not being anything  by the door and that the RN would have his meds, but throughout session pt continued to talk about antacids.  RN aware.      General Comments      Exercises        Assessment/Plan    PT Assessment Patient needs continued PT services  PT Diagnosis Difficulty walking;Generalized weakness   PT Problem List Decreased strength;Decreased activity tolerance;Decreased balance;Decreased mobility;Decreased coordination;Decreased cognition;Decreased knowledge of use of DME;Decreased safety awareness  PT Treatment Interventions DME instruction;Gait training;Functional mobility training;Therapeutic activities;Therapeutic exercise;Balance training;Neuromuscular  re-education;Cognitive remediation;Patient/family education   PT Goals (Current goals can be found in the Care Plan section) Acute Rehab PT Goals Patient Stated Goal: Get better. PT Goal Formulation: With patient Time For Goal Achievement: 12/13/15 Potential to Achieve Goals: Good    Frequency Min 3X/week   Barriers to discharge        Co-evaluation               End of Session Equipment Utilized During Treatment: Gait belt Activity Tolerance: Patient limited by fatigue Patient left: in chair;with call bell/phone within reach;with chair alarm set Nurse Communication: Mobility status         Time: [id number] PT Time Calculation (min) (ACUTE ONLY): 15 min   Charges:   PT Evaluation $Initial PT Evaluation Tier I: 1 Procedure     PT G CodesCatarina Hartshorn, [place] 11/29/2015, 12:01 PM

## 2015-11-29 NOTE — Evaluation (Signed)
Clinical/Bedside Swallow Evaluation Patient Details  Name: [name] MRN: [id number] Date of Birth: 02-23-1922  Today's Date: 11/29/2015 Time: SLP Start Time (ACUTE ONLY): [id number] SLP Stop Time (ACUTE ONLY): 1623 SLP Time Calculation (min) (ACUTE ONLY): 13 min  Past Medical History:  Past Medical History  Diagnosis Date  . OTH MALIG NEOPLASM SKIN OTH\T\UNSPEC PARTS FACE 02/10/2009  . HYPERLIPIDEMIA 08/19/2007  . 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 ACTINIC KERATOSIS, EAR, LEFT 06/13/2008  . PROSTATE SPECIFIC ANTIGEN, ELEVATED, CHRONIC 06/15/2010  . History of prostate cancer 2001--  MILD FORM--  NO TX---  FOLLOWED BY DR [facility]  . BPH (benign prostatic hypertrophy)   . Carotid stenosis, asymptomatic BILATERAL MILD ICA STENOSIS   < 50%    PER CAROTID DUPLEX  09-21-2010  . Arthritis   . Colon polyp   . Irritable bowel syndrome   . Prostate cancer metastatic to bone [facility])    Past Surgical History:  Past Surgical History  Procedure Laterality Date  . Total knee arthroplasty  02-11-2006     left knee  oa  . Transurethral resection of prostate  07-29-2000    bph w/ boo  . Lumbar laminotomy and foraminotomy bilaterally  05-12-2007    L2 - 3 ;  SPONDYLOSIS W/ STENOSIS  . Cardiovascular stress test  09-20-2010  DR BENSIMHON    NORMAL NUCLEAR STUDY/ NO ISCHEMIA/ EF 68%  . Prostate biopsy  07/16/2012    Procedure: PROSTATE BIOPSY;  Surgeon: Malka So, MD;  Location: [facility];  Service: Urology;  Laterality: N/A;  WITH ULTRASOUND ([facility] ULTRA SOUND TECH COMING) OWER  . Transurethral resection of prostate  07/16/2012    Procedure: TRANSURETHRAL RESECTION OF THE PROSTATE (TURP);  Surgeon: Malka So, MD;  Location: [facility];  Service: Urology;;  with gyrus   HPI:  pt presents with R Frontotemporoparietal SDH now s/p Crani and evacuation. pt with hx of skin CA, Prostate CA with Bone Mets, L TKA, and Back  Surgery.    Assessment / Plan / Recommendation Clinical Impression  Pt shows overt signs concerning for airway penetration with straw sips of thin liquids and even with pureed solids as he begins to slide down in the bed. Pt requires repositioning to throughout intake to maintain appropriate posture for safe swallowing. When he is in this position, he consumes small cup sips of water and puree without overt coughing or changes in vocal quality. Cognitive status also impacts his aspiration risk at this time - full cognitive-linguistic evaluation to be completed on next date. Would allow Dys 1 diet and thin liquids by cup with full supervision.    Aspiration Risk  Moderate aspiration risk    Diet Recommendation  Dys 1 (puree) diet, thin liquids NO straws Full supervision   Medication Administration: Crushed with puree    Other  Recommendations Oral Care Recommendations: Oral care BID   Follow up Recommendations  Inpatient Rehab;24 hour supervision/assistance    Frequency and Duration min 2x/week  2 weeks       Prognosis Prognosis for Safe Diet Advancement: Good Barriers to Reach Goals: Cognitive deficits      Swallow Study   General HPI: pt presents with R Frontotemporoparietal SDH now s/p Crani and evacuation. pt with hx of skin CA, Prostate CA with Bone Mets, L TKA, and Back Surgery.  Type of Study: Bedside Swallow Evaluation Previous Swallow Assessment: none in chart - although SLP  screened swallowing on previous date with no overt signs of aspiration Diet Prior to this Study: Thin liquids Temperature Spikes Noted: No Respiratory Status: Room air History of Recent Intubation: Yes Length of Intubations (days):  (for procedure`) Date extubated: 11/28/15 Behavior/Cognition: Alert;Cooperative;Requires cueing;Other (Comment) (perseverative) Oral Cavity Assessment: Within Functional Limits Oral Care Completed by SLP: No Self-Feeding Abilities: Needs assist Patient  Positioning: Upright in bed Baseline Vocal Quality: Low vocal intensity Volitional Cough: Weak (pt says it hurts to cough, suspect limited effort) Volitional Swallow: Able to elicit    Oral/Motor/Sensory Function Overall Oral Motor/Sensory Function: Within functional limits   Ice Chips Ice chips: Within functional limits Presentation: Spoon   Thin Liquid Thin Liquid: Impaired Presentation: Cup;Self Fed;Straw Oral Phase Functional Implications: Oral holding Pharyngeal  Phase Impairments: Suspected delayed Swallow;Cough - Immediate;Cough - Delayed    Nectar Thick Nectar Thick Liquid: Not tested   Honey Thick Honey Thick Liquid: Not tested   Puree Puree: Impaired Presentation: Self Fed;Spoon Oral Phase Impairments: Poor awareness of bolus Oral Phase Functional Implications: Prolonged oral transit;Oral holding Pharyngeal Phase Impairments: Suspected delayed Swallow;Cough - Delayed   Solid Solid: Not tested      Germain Osgood, M.A. CCC-SLP [phone]  Germain Osgood 11/29/2015,4:49 PM

## 2015-11-29 NOTE — Plan of Care (Signed)
Problem: Physical Regulation: Goal: Usual level of consciousness will be regained or maintained. Outcome: Progressing Pt RAAS -1, sleepy but easily arousable; oriented x3-4, confused at times

## 2015-11-29 NOTE — Progress Notes (Signed)
Occupational Therapy Evaluation Patient Details Name: [name] MRN: [id number] DOB: [birth year] Today's Date: 11/29/2015    History of Present Illness pt presents with R Frontotemporoparietal SDH now s/p Crani and evacuation.  pt with hx of skin CA, Prostate CA with Bone Mets, L TKA, and Back Surgery.     Clinical Impression   PTA, pt independent with ADL and mobility. Pt presents  with significant deficits with cognition, vision/visual perceptual skills and generalized weakness and will benefit from CIR for rehab. Pt coughing with every sip of thin liquids while sitting EOB .Recommend bedside swallow eval - nsg aware. Will follow acutely to facilitate D/C to next venue    Follow Up Recommendations  CIR;Supervision/Assistance - 24 hour    Equipment Recommendations  3 in 1 bedside comode;Tub/shower bench    Recommendations for Other Services Rehab consult     Precautions / Restrictions Precautions Precautions: Fall Restrictions Weight Bearing Restrictions: No      Mobility Bed Mobility Overal bed mobility: Needs Assistance;+2 for physical assistance Bed Mobility: Supine to Sit     Supine to sit: Mod assist;+2 for physical assistance;HOB elevated     General bed mobility comments: cues for sequencing and A for trunk and Bil LEs.    Transfers     Balance Overall balance assessment: Needs assistance;History of Falls Sitting-balance support: Bilateral upper extremity supported;Feet supported Sitting balance-Leahy Scale: Fair     Standing balance support: During functional activity Standing balance-Leahy Scale: Poor                              ADL Overall ADL's : Needs assistance/impaired Eating/Feeding: Moderate assistance Eating/Feeding Details (indicate cue type and reason): Pt coughingwith thin liqueds. Needs swallow eval Grooming: Moderate assistance   Upper Body Bathing: Moderate assistance   Lower Body Bathing: Moderate  assistance;Sit to/from stand   Upper Body Dressing : Maximal assistance   Lower Body Dressing: Maximal assistance   Toilet Transfer: Moderate assistance;+2 for physical assistance   Toileting- Clothing Manipulation and Hygiene: Moderate assistance         General ADL Comments: Perseverating during funcitonal tasks. Past pointing when reaching for objects. difficulty maintaining attention to functional tasks     Vision Vision Assessment?: Vision impaired- to be further tested in functional context Additional Comments: wife states glasses are lost   Perception Perception Spatial deficits: impaired. will further assess. Stating jello was tea and trying to drink it   Praxis Praxis Praxis tested?: Deficits Deficits: Perseveration;Organization;Initiation    Pertinent Vitals/Pain Pain Assessment: Faces Faces Pain Scale: Hurts little more Pain Location: head     Hand Dominance Right   Extremity/Trunk Assessment Upper Extremity Assessment Upper Extremity Assessment: RUE deficits/detail;LUE deficits/detail RUE Deficits / Details: generalized weakness. difficulty with coordination RUE Coordination: decreased fine motor LUE Deficits / Details: generalized weakness LUE Sensation: decreased proprioception LUE Coordination: decreased fine motor   Lower Extremity Assessment Lower Extremity Assessment: Defer to PT evaluation   Cervical / Trunk Assessment Cervical / Trunk Assessment: Kyphotic   Communication Communication Communication: No difficulties   Cognition Arousal/Alertness: Awake/alert Behavior During Therapy: Impulsive Overall Cognitive Status: No family/caregiver present to determine baseline cognitive functioning                 General Comments: pt is oriented, but is perseverating on getting his antacids out of the top drawer and indicating towards the door.  Multiple attemtps to orient pt to there not  being anything by the door and that the RN would have  his meds, but throughout session pt continued to talk about antacids.  RN aware.     General Comments       Exercises       Shoulder Instructions      Home Living Family/patient expects to be discharged to:: Inpatient rehab Living Arrangements: Spouse/significant other                               Additional Comments: pt has a wife, but per RN he indicates he has been trying to seperate from her for about a year now.    Lives With: Spouse (spouse staes she is trying to get help to care for her husba)    Prior Functioning/Environment Level of Independence: Independent        Comments: enjoyed mowing the grass    OT Diagnosis: Generalized weakness;Cognitive deficits;Disturbance of vision;Acute pain   OT Problem List: Decreased strength;Decreased range of motion;Decreased activity tolerance;Impaired balance (sitting and/or standing);Impaired vision/perception;Decreased coordination;Decreased cognition;Decreased safety awareness;Decreased knowledge of use of DME or AE;Impaired sensation;Impaired UE functional use;Pain   OT Treatment/Interventions: Self-care/ADL training;Therapeutic exercise;Neuromuscular education;DME and/or AE instruction;Therapeutic activities;Visual/perceptual remediation/compensation;Cognitive remediation/compensation;Patient/family education;Balance training    OT Goals(Current goals can be found in the care plan section) Acute Rehab OT Goals Patient Stated Goal: Get better. OT Goal Formulation: With patient/family Time For Goal Achievement: 12/13/15 Potential to Achieve Goals: Good ADL Goals Pt Will Perform Eating: with set-up;with supervision;sitting Pt Will Perform Grooming: with set-up;with supervision;sitting Pt Will Perform Upper Body Bathing: with set-up;with supervision;sitting Pt Will Perform Upper Body Dressing: with min assist;sitting Pt Will Transfer to Toilet: with min assist;bedside commode;stand pivot transfer Additional ADL  Goal #1: Pt will demonstrate emergent awareness during ADL tasks.   OT Frequency: Min 2X/week   Barriers to D/C: Other (comment) (unsure of caregiver support)          Co-evaluation              End of Session Nurse Communication: Mobility status;Other (comment) (need for ST consult for cognition and swallow)  Activity Tolerance: Patient tolerated treatment well Patient left: in bed;with call bell/phone within reach;with bed alarm set;with family/visitor present   Time: 1345-1416 OT Time Calculation (min): 31 min Charges:  OT General Charges $OT Visit: 1 Procedure OT Evaluation $Initial OT Evaluation Tier I: 1 Procedure OT Treatments $Self Care/Home Management : 8-22 mins G-Codes:    Curtina Grills,H[place] 2015/12/13, 2:42 PM   [facility], OTR/L  [phone] 2015/12/13

## 2015-11-29 NOTE — Progress Notes (Signed)
Inpatient Rehabilitation  Patient was screened by Darrold Bezek for appropriateness for an Inpatient Acute Rehab consult.  At this time, we are recommending Inpatient Rehab consult.  Please order when you feel appropriate.   Samina Weekes PT Inpatient Rehab Admissions Coordinator Cell [phone] Office [phone]   

## 2015-11-30 ENCOUNTER — Inpatient Hospital Stay (HOSPITAL_COMMUNITY): Payer: Medicare Other

## 2015-11-30 ENCOUNTER — Encounter (HOSPITAL_COMMUNITY): Payer: Self-pay | Admitting: Neurosurgery

## 2015-11-30 DIAGNOSIS — G459 Transient cerebral ischemic attack, unspecified: Secondary | ICD-10-CM

## 2015-11-30 MED ORDER — OXYBUTYNIN CHLORIDE ER 5 MG PO TB24
5.0000 mg | ORAL_TABLET | Freq: Every day | ORAL | Status: DC
  Administered 2015-11-30 – 2015-12-01 (×2): 5 mg via ORAL
  Filled 2015-11-30 (×3): qty 1

## 2015-11-30 NOTE — Progress Notes (Signed)
Bedside report done with day shift nurse. Patient very lethargic and mumbling words. Day shift nurse reported MD is aware. Will continue to monitor.

## 2015-11-30 NOTE — Progress Notes (Signed)
Pt continues to be drowsy but is arousable and can answer some orientation questions. Addressed concerns with Dr. Christella Noa, believes age is primary factor. Drain is still in place, vital signs remain stable. Will continue to monitor.

## 2015-11-30 NOTE — Progress Notes (Signed)
Postoperative day 2. Patient denies headache. Complains of bladder pain. No other complaints.  Afebrile. Awake and alert. Oriented and appropriate. Motor 5/5 bilaterally. Wound clean and dry. Drain output still high with 200 mL of blood-tinged fluid output yesterday.  Continues to do well following craniotomy for subdural. Plan follow-up scan tomorrow.

## 2015-11-30 NOTE — Progress Notes (Signed)
VASCULAR LAB PRELIMINARY  PRELIMINARY  PRELIMINARY  PRELIMINARY  Carotid duplex completed.    Preliminary report:  Bilateral:  1-39% ICA stenosis.  Vertebral artery flow is antegrade.     Kimberlie Csaszar, RVS 11/30/2015, 12:01 PM  WOOD, Tami, RVT 11/30/2015, 12.01 PM

## 2015-11-30 NOTE — Care Management Important Message (Signed)
Important Message  Patient Details  Name: [name] MRN: [id number] Date of Birth: October 17, 1922   Medicare Important Message Given:  Yes    Keslee Harrington [place] 11/30/2015, 1:04 PM

## 2015-11-30 NOTE — Progress Notes (Addendum)
Speech Language Pathology Treatment: Dysphagia  Patient Details Name: [name] MRN: [id number] DOB: 05-05-1922 Today's Date: 11/30/2015 Time: 1010-1026 SLP Time Calculation (min) (ACUTE ONLY): 16 min  Assessment / Plan / Recommendation Clinical Impression  Treatment focused on diet tolerance and potential to advance. Patient much more lethargic than during initial evaluation 11/30. RN aware. Patient able to be aroused however quickly falls back to sleep. During brief periods of alertness, patient with seemingly intact airway protection with clinician provided therapeutic po trials with one episode of delayed throat clearing noting potential penetration and/or aspiration. Nectar thick liquid trials provided with adequate airway protection as well. Recommend downgrading liquids to facilitate increased airway protection given degree of lethargy. Education complete with RN regarding increased risk of aspiration with fatigue. Recommend to feed/allow to eat only when fully alert. Prognosis for ability to re-advance diet good with improved alertness. Patient will benefit from continued SLP f/u for diet tolerance and potential to advance as well as cognitive-linguistic evaluation when more alert.    HPI HPI: pt presents with R Frontotemporoparietal SDH now s/p Crani and evacuation. pt with hx of skin CA, Prostate CA with Bone Mets, L TKA, and Back Surgery.       SLP Plan  Continue with current plan of care     Recommendations  Diet recommendations: Dysphagia 1 (puree);Nectar-thick liquid Liquids provided via: Cup;No straw Medication Administration: Crushed with puree Supervision: Staff to assist with self feeding;Full supervision/cueing for compensatory strategies Compensations: Slow rate;Small sips/bites Postural Changes and/or Swallow Maneuvers: Seated upright 90 degrees              Oral Care Recommendations: Oral care BID Follow up Recommendations: Inpatient Rehab;24 hour  supervision/assistance Plan: Continue with current plan of care  [facility] [place], [place] [phone]  [name] [name] 11/30/2015, 10:51 AM

## 2015-11-30 NOTE — Progress Notes (Signed)
Got pt up to bedside commode to try to void, pt was able to void 267ml. While giving pt his meds in puree sitting upright he had problems moving the meds and applesauce and liquid to back of his throat. Pill eventually melted in his mouth and pt drooled a large amount during this time. Pt became lethargic and was unable to hold his eyes open. As we transitioned him back to bed, he wasn't holding his upper body up like he done on the original transition.   Pt still able to follow commands but falls asleep during requests and questions. His words are more slurred than in my earlier assessment. I asked him if he could tell he was speaking differently, pt responded, "yes, it takes a lot of effort to try to talk."  We positioned patient comfortably in bed and called Dr. Annette Stable to report the changes and lethargy. Dr. Annette Stable answered his phone and acknowledged the information and will come to check on him.  [name]

## 2015-11-30 NOTE — Care Management Note (Signed)
Case Management Note  Patient Details  Name: [name] MRN: [id number] Date of Birth: 1922/07/19  Subjective/Objective:  Pt admitted on 11/28 with SDH, epidural hematoma requiring craniotomy on 11/28/15.  PTA, pt resided at home with spouse.                 Action/Plan: PT/OT consults pending.  Will follow for discharge planning.    Expected Discharge Date:          Expected Discharge Plan:  [place]  In-House Referral:     Discharge planning Services  CM Consult  Post Acute Care Choice:    Choice offered to:     DME Arranged:    DME Agency:     HH Arranged:    [place] Agency:     Status of Service:  In process, will continue to follow  Medicare Important Message Given:  Yes Date Medicare IM Given:    Medicare IM give by:    Date Additional Medicare IM Given:    Additional Medicare Important Message give by:     If discussed at [place] of Stay Meetings, dates discussed:    Additional Comments:  Reinaldo Raddle, RN, BSN  Trauma/Neuro ICU Case Manager [phone]

## 2015-11-30 NOTE — Plan of Care (Signed)
Problem: Education: Goal: Knowledge of disease or condition will improve Outcome: Not Progressing Pt is drowsy and forgetful. Unable to retain educational information at this time.   Problem: Physical Regulation: Goal: Neurologic status will improve Outcome: Not Progressing Pt drowsy and has short attention span, is falling asleep between questions. However, d/t pt advanced age, this is a possible expected finding and has been addressed with MD.  Goal: Postoperative complications will be avoided or minimized Outcome: Progressing Pt experiencing drowsiness but is not experiencing any other postoperative complications.

## 2015-12-01 ENCOUNTER — Inpatient Hospital Stay (HOSPITAL_COMMUNITY): Payer: Medicare Other

## 2015-12-01 MED ORDER — PANTOPRAZOLE SODIUM 40 MG PO TBEC
40.0000 mg | DELAYED_RELEASE_TABLET | Freq: Every day | ORAL | Status: DC
  Administered 2015-12-01: 40 mg via ORAL
  Filled 2015-12-01: qty 1

## 2015-12-01 MED ORDER — PSYLLIUM 95 % PO PACK
1.0000 | PACK | Freq: Two times a day (BID) | ORAL | Status: DC | PRN
  Filled 2015-12-01 (×3): qty 1

## 2015-12-01 NOTE — Progress Notes (Signed)
Postop day 3. Patient more somnolent but awakens easily. Speech is slurred and content is carried by patient still oriented to person, place, time and situation. Moving all 4 extremities. Perhaps slightly weaker on the left. Wound clean and dry. Chest and abdomen benign.  Follow-up head CT scan looks good. No evidence of residual/recurrent hematoma. No evidence of infarction. Still with quite a bit of subdural air. Subdural drain now putting out CSF.  I'm pleased the parents the CT scan. I think some of the patient's G may be secondary to drainage of CSF through his subdural drain. I have removed his drain this morning I hope this will help improve his situation. Patient should be mobilized as before.

## 2015-12-01 NOTE — Evaluation (Signed)
Speech Language Pathology Evaluation Patient Details Name: [name] MRN: [id number] DOB: 01/23/1922 Today's Date: 12/01/2015 Time: [id number] SLP Time Calculation (min) (ACUTE ONLY): 12 min  Problem List:  Patient Active Problem List   Diagnosis Date Noted  . Subdural hematoma ([place]) 11/28/2015  . Epidural hematoma ([place]) 11/27/2015  . Right sided weakness 11/27/2015  . Frequent falls 11/14/2015  . Routine general medical examination at a health care facility 06/30/2015  . HTN (hypertension) 12/27/2014  . Episodic mood disorder ([place]) 12/27/2014  . Osteoarthritis of spine 04/08/2014  . Malignant neoplasm of prostate ([place]) 08/12/2012  . Memory loss 04/27/2012  . 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 OTH MALIG NEOPLASM SKIN OTH&UNSPEC PARTS FACE 02/10/2009  . Hyperlipemia 08/19/2007  . GERD 08/19/2007   Past Medical History:  Past Medical History  Diagnosis Date  . OTH MALIG NEOPLASM SKIN OTH\T\UNSPEC PARTS FACE 02/10/2009  . HYPERLIPIDEMIA 08/19/2007  . 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 ACTINIC KERATOSIS, EAR, LEFT 06/13/2008  . PROSTATE SPECIFIC ANTIGEN, ELEVATED, CHRONIC 06/15/2010  . History of prostate cancer 2001--  MILD FORM--  NO TX---  FOLLOWED BY DR [facility]  . BPH (benign prostatic hypertrophy)   . Carotid stenosis, asymptomatic BILATERAL MILD ICA STENOSIS   < 50%    PER CAROTID DUPLEX  09-21-2010  . Arthritis   . Colon polyp   . Irritable bowel syndrome   . Prostate cancer metastatic to bone [facility])    Past Surgical History:  Past Surgical History  Procedure Laterality Date  . Total knee arthroplasty  02-11-2006     left knee  oa  . Transurethral resection of prostate  07-29-2000    bph w/ boo  . Lumbar laminotomy and foraminotomy bilaterally  05-12-2007    L2 - 3 ;  SPONDYLOSIS W/ STENOSIS  . Cardiovascular stress test  09-20-2010  DR BENSIMHON    NORMAL NUCLEAR STUDY/ NO ISCHEMIA/ EF 68%  . Prostate biopsy  07/16/2012    Procedure: PROSTATE BIOPSY;  Surgeon: Malka So, MD;  Location: [facility];  Service: Urology;  Laterality: N/A;  WITH ULTRASOUND ([facility] ULTRA SOUND TECH COMING) OWER  . Transurethral resection of prostate  07/16/2012    Procedure: TRANSURETHRAL RESECTION OF THE PROSTATE (TURP);  Surgeon: Malka So, MD;  Location: [facility];  Service: Urology;;  with gyrus  . Craniotomy Right 11/28/2015    Procedure: CRANIOTOMY HEMATOMA EVACUATION SUBDURAL;  Surgeon: Earnie Larsson, MD;  Location: [place] [facility];  Service: Neurosurgery;  Laterality: Right;  CRANIOTOMY HEMATOMA EVACUATION SUBDURAL   HPI:  pt presents with R Frontotemporoparietal SDH now s/p Crani and evacuation. pt with hx of skin CA, Prostate CA with Bone Mets, L TKA, and Back Surgery.    Assessment / Plan / Recommendation Clinical Impression  Pt presents with changes in speech/cognition s/p crani marked by mild dysarthria with hypernasality, impaired sustained and selective attention, and decreased retrieval of new information.  Pt asking repetitive questions, demonstrates mild impulsivity.  Recommend SLP f/u to address acute cognitive changes.      SLP Assessment  Patient needs continued Speech Lanaguage Pathology Services    Follow Up Recommendations  Inpatient Rehab;24 hour supervision/assistance    Frequency and Duration min 2x/week  2 weeks      SLP Evaluation Prior Functioning  Cognitive/Linguistic Baseline: Within functional limits Type of Home: House  Lives With: Spouse Available Help at Discharge: Family;Available 24 hours/day  Cognition  Overall Cognitive Status: Impaired/Different from baseline Arousal/Alertness: Awake/alert Orientation Level: Oriented to person;Oriented to place;Oriented to situation;Disoriented to time Attention: Sustained Sustained Attention: Impaired Sustained Attention Impairment: Verbal basic;Functional basic Memory: Impaired Memory Impairment:  Storage deficit;Retrieval deficit Awareness: Impaired Awareness Impairment: Emergent impairment Problem Solving: Impaired Problem Solving Impairment: Verbal basic Safety/Judgment: Impaired    Comprehension  Auditory Comprehension Overall Auditory Comprehension: Appears within functional limits for tasks assessed Reading Comprehension Reading Status: Not tested    Expression Expression Primary Mode of Expression: Verbal Verbal Expression Overall Verbal Expression: Appears within functional limits for tasks assessed Written Expression Dominant Hand: Right Written Expression: Not tested   Oral / Motor Oral Motor/Sensory Function Overall Oral Motor/Sensory Function: Mild impairment Facial ROM: Suspected CN VII (facial) dysfunction (right) Motor Speech Overall Motor Speech: Impaired Resonance: Hypernasality Articulation: Impaired Level of Impairment: Sentence Intelligibility: Intelligibility reduced Phrase: 75-100% accurate Sentence: 75-100% accurate    [name] 12/01/2015, 11:41 AM

## 2015-12-01 NOTE — Progress Notes (Signed)
PT Cancellation Note  Patient Details Name: [name] MRN: [id number] DOB: 28-Jun-1922   Cancelled Treatment:    Reason Eval/Treat Not Completed: Fatigue/lethargy limiting ability to participate.  Pt very drowsy and with painful stimuli will arouse briefly, but for only 2-3 seconds before falling back asleep.  Will f/u another time.     Safiya Girdler, Thornton Papas 12/01/2015, 8:53 AM

## 2015-12-01 NOTE — Progress Notes (Signed)
Speech Language Pathology Treatment: Dysphagia  Patient Details Name: EVON TALARICO MRN: [id number] DOB: Feb 05, 1922 Today's Date: 12/01/2015 Time: [id number] SLP Time Calculation (min) (ACUTE ONLY): 10 min  Assessment / Plan / Recommendation Clinical Impression  Pt required max cues to awaken, but then was able to sustain attention to PO intake - consumed 4 oz applesauce with mod tactile/verbal cues for oral propulsion.  Palpable swallow response noted with no overt s/s of aspiration.  Continue conservative diet of purees, nectar-thick liquids for now -SLP will continue to follow for safety and readiness for diet advancement.    HPI HPI: pt presents with R Frontotemporoparietal SDH now s/p Crani and evacuation. pt with hx of skin CA, Prostate CA with Bone Mets, L TKA, and Back Surgery.       SLP Plan  Continue with current plan of care     Recommendations  Diet recommendations: Dysphagia 1 (puree);Nectar-thick liquid Liquids provided via: Cup;No straw Medication Administration: Crushed with puree Supervision: Staff to assist with self feeding;Full supervision/cueing for compensatory strategies Compensations: Slow rate;Small sips/bites Postural Changes and/or Swallow Maneuvers: Seated upright 90 degrees              Oral Care Recommendations: Oral care BID Follow up Recommendations: Inpatient Rehab;24 hour supervision/assistance Plan: Continue with current plan of care   [name] 12/01/2015, 11:44 AM

## 2015-12-01 NOTE — Progress Notes (Signed)
Occupational Therapy Treatment Patient Details Name: [name] MRN: [id number] DOB: [birth year] Today's Date: 12/01/2015    History of present illness pt presents with R Frontotemporoparietal SDH now s/p Crani and evacuation.  pt with hx of skin CA, Prostate CA with Bone Mets, L TKA, and Back Surgery.     OT comments  Pt still with decreased sustained attention to selfcare tasks as well as impulsivity and overall decreased sitting and standing balance.  Still slightly lethargic during session and following approximately 30% of one step commands related to bed mobility and transfers.  Will continue to follow for OT treatment.  Hopefully will continue increasing his attention level for transition to CIR level therapies.   Follow Up Recommendations  CIR;Supervision/Assistance - 24 hour    Equipment Recommendations  3 in 1 bedside comode;Tub/shower bench    Recommendations for Other Services Rehab consult    Precautions / Restrictions Precautions Precautions: Fall Restrictions Weight Bearing Restrictions: No       Mobility Bed Mobility   Bed Mobility: Supine to Sit;Sit to Supine     Supine to sit: Max assist Sit to supine: Total assist   General bed mobility comments: cues for sequencing and A for trunk and Bil LEs.    Transfers Overall transfer level: Needs assistance Equipment used: 1 person hand held assist Transfers: Sit to/from Stand Sit to Stand: Max assist Stand pivot transfers: Max assist            Balance     Sitting balance-Leahy Scale: Poor (Pt with LOB posteriorly sitting unsupported.  Could not maintain)       Standing balance-Leahy Scale: Poor                     ADL Overall ADL's : Needs assistance/impaired     Grooming: Wash/dry face;Moderate assistance;Sitting                   Toilet Transfer: Maximal assistance;BSC;Stand-pivot   Toileting- Clothing Manipulation and Hygiene: Maximal assistance;Sit to/from stand          General ADL Comments: Pt with garbled speech difficult to understand 75% of the time.  Needed him to repeat statements/questions.  Still impulsive, immediately attempting to stand once therapist transfered him to the EOB.  Needed max demonstrational cueing for hand placement, with pt grabbing on commode arm and not letting go initially.  Pt also perseverating on time of the day, asking multiple times within a 5 min span.               Cognition   Behavior During Therapy: Impulsive Overall Cognitive Status: Impaired/Different from baseline Area of Impairment: Orientation;Following commands;Safety/judgement;Awareness Orientation Level: Place      Following Commands: Follows one step commands inconsistently       General Comments: Pt not oriented to place or situation.  Demonstrates impulsivity with attempted sit to stand and standing for toilet transfer.                   Pertinent Vitals/ Pain       Pain Assessment: Faces Faces Pain Scale: No hurt         Frequency Min 2X/week     Progress Toward Goals  OT Goals(current goals can now be found in the care plan section)  Progress towards OT goals: Progressing toward goals     Plan Discharge plan remains appropriate          Activity Tolerance Patient limited  by lethargy   Patient Left in bed;with call bell/phone within reach;with SCD's reapplied   Nurse Communication Mobility status        Time: [id number] OT Time Calculation (min): 29 min  Charges: OT General Charges $OT Visit: 1 Procedure OT Treatments $Self Care/Home Management : 23-37 mins  Jhoselyn Ruffini OTR/L 12/01/2015, 4:07 PM

## 2015-12-01 NOTE — Progress Notes (Signed)
Rehab admissions - Evaluated for possible admission.  Patient very sleepy and lethargic today.  Not ready yet for acute inpatient rehab admission.  I will check back on Monday for progress.  Call me for questions.  [id number]

## 2015-12-02 MED ORDER — PANTOPRAZOLE SODIUM 40 MG PO PACK
40.0000 mg | PACK | ORAL | Status: DC
  Administered 2015-12-02 – 2015-12-05 (×3): 40 mg
  Filled 2015-12-02 (×4): qty 20

## 2015-12-02 MED ORDER — OXYBUTYNIN CHLORIDE 5 MG/5ML PO SYRP
5.0000 mg | ORAL_SOLUTION | Freq: Every day | ORAL | Status: DC
  Administered 2015-12-02 – 2015-12-05 (×4): 5 mg via ORAL
  Filled 2015-12-02 (×6): qty 5

## 2015-12-02 NOTE — Progress Notes (Signed)
No acute events AVSS Awake, very slurred speech Follows commands bilaterally, briskly Dressing with some dried blood but otherwise looks good Stable Keep in the ICU for at least 1 more day due to mental status and nursing needs

## 2015-12-03 ENCOUNTER — Inpatient Hospital Stay (HOSPITAL_COMMUNITY): Payer: Medicare Other

## 2015-12-03 MED ORDER — ACETAMINOPHEN 650 MG RE SUPP
650.0000 mg | RECTAL | Status: DC | PRN
  Administered 2015-12-03 – 2015-12-05 (×3): 650 mg via RECTAL
  Filled 2015-12-03 (×3): qty 1

## 2015-12-03 MED ORDER — HYDRALAZINE HCL 20 MG/ML IJ SOLN
20.0000 mg | INTRAMUSCULAR | Status: DC | PRN
  Administered 2015-12-03 – 2015-12-04 (×4): 20 mg via INTRAVENOUS
  Filled 2015-12-03 (×4): qty 1

## 2015-12-03 NOTE — Progress Notes (Signed)
No acute events AVSS Awake, speech less slurred than yesterday Oriented to name Follows commands bilaterally, briskly Dressing with some dried blood but otherwise looks good Stable Repeat CT stable Transfer to floor

## 2015-12-04 ENCOUNTER — Inpatient Hospital Stay (HOSPITAL_COMMUNITY): Payer: Medicare Other

## 2015-12-04 LAB — URINE MICROSCOPIC-ADD ON

## 2015-12-04 LAB — CBC WITH DIFFERENTIAL/PLATELET
BASOS ABS: 0 10*3/uL (ref 0.0–0.1)
BASOS PCT: 0 %
Eosinophils Absolute: 0 10*3/uL (ref 0.0–0.7)
Eosinophils Relative: 0 %
HEMATOCRIT: 30.4 % — AB (ref 39.0–52.0)
HEMOGLOBIN: 10.2 g/dL — AB (ref 13.0–17.0)
Lymphocytes Relative: 11 %
Lymphs Abs: 1.4 10*3/uL (ref 0.7–4.0)
MCH: 29.1 pg (ref 26.0–34.0)
MCHC: 33.6 g/dL (ref 30.0–36.0)
MCV: 86.6 fL (ref 78.0–100.0)
MONO ABS: 1.6 10*3/uL — AB (ref 0.1–1.0)
Monocytes Relative: 13 %
NEUTROS ABS: 9.5 10*3/uL — AB (ref 1.7–7.7)
NEUTROS PCT: 76 %
Platelets: 261 10*3/uL (ref 150–400)
RBC: 3.51 MIL/uL — ABNORMAL LOW (ref 4.22–5.81)
RDW: 13.8 % (ref 11.5–15.5)
WBC: 12.5 10*3/uL — AB (ref 4.0–10.5)

## 2015-12-04 LAB — URINALYSIS, ROUTINE W REFLEX MICROSCOPIC
Bilirubin Urine: NEGATIVE
Glucose, UA: NEGATIVE mg/dL
KETONES UR: 15 mg/dL — AB
NITRITE: POSITIVE — AB
PH: 6 (ref 5.0–8.0)
Protein, ur: 30 mg/dL — AB
Specific Gravity, Urine: 1.016 (ref 1.005–1.030)

## 2015-12-04 LAB — GLUCOSE, CAPILLARY: GLUCOSE-CAPILLARY: 99 mg/dL (ref 65–99)

## 2015-12-04 MED ORDER — LEVOFLOXACIN 500 MG PO TABS
500.0000 mg | ORAL_TABLET | Freq: Every day | ORAL | Status: DC
  Administered 2015-12-05: 500 mg via ORAL
  Filled 2015-12-04 (×3): qty 1

## 2015-12-04 MED ORDER — RESOURCE THICKENUP CLEAR PO POWD
ORAL | Status: DC | PRN
  Filled 2015-12-04: qty 125

## 2015-12-04 NOTE — NC FL2 (Signed)
[place] MEDICAID FL2 LEVEL OF CARE SCREENING TOOL     IDENTIFICATION  Patient Name: [name] Birthdate: 02-Jun-1922 Sex: male Admission Date (Current Location): 11/27/2015  [facility] and [place] Number: [place] and Address:  [facility] [place]. [facility], [place] [address] [place], [place] [address]      Provider Number: [id number]  Attending Physician Name and Address:  Earnie Larsson, MD  Relative Name and Phone Number:       Current Level of Care: Hospital Recommended Level of Care: [place] Prior Approval Number:    Date Approved/Denied:   PASRR Number:   [id number]  Discharge Plan: SNF    Current Diagnoses: Patient Active Problem List   Diagnosis Date Noted  . Subdural hematoma ([place]) 11/28/2015  . Epidural hematoma ([place]) 11/27/2015  . Right sided weakness 11/27/2015  . Frequent falls 11/14/2015  . Routine general medical examination at a health care facility 06/30/2015  . HTN (hypertension) 12/27/2014  . Episodic mood disorder ([place]) 12/27/2014  . Osteoarthritis of spine 04/08/2014  . Malignant neoplasm of prostate ([place]) 08/12/2012  . Memory loss 04/27/2012  . 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 OTH MALIG NEOPLASM SKIN OTH&UNSPEC PARTS FACE 02/10/2009  . Hyperlipemia 08/19/2007  . GERD 08/19/2007    Orientation ACTIVITIES/SOCIAL BLADDER RESPIRATION    Self  Family supportive Incontinent Normal  BEHAVIORAL SYMPTOMS/MOOD NEUROLOGICAL BOWEL NUTRITION STATUS   (NONE)  (NONE) Continent Diet (DYS 1)  PHYSICIAN VISITS COMMUNICATION OF NEEDS Height & Weight Skin    Verbally 5\' 2"  (157.5 cm) 190 lbs. Skin abrasions          AMBULATORY STATUS RESPIRATION    Assist extensive Normal      Personal Care Assistance Level of Assistance  Bathing, Feeding, Dressing Bathing Assistance: Maximum assistance Feeding assistance: Maximum assistance Dressing Assistance: Maximum assistance      Functional Limitations  Info   (NONE)             SPECIAL CARE FACTORS FREQUENCY  OT (By licensed OT), PT (By licensed PT)     PT Frequency: 2 OT Frequency: 2           Additional Factors Info  Code Status, Allergies Code Status Info: FULL CODE  Allergies Info: N/A           Current Medications (12/04/2015):  This is the current hospital active medication list Current Facility-Administered Medications  Medication Dose Route Frequency Provider Last Rate Last Dose  . 0.9 % NaCl with KCl 20 mEq/ L  infusion   Intravenous Continuous Earnie Larsson, MD 75 mL/hr at 12/03/15 0337 1,000 mL at 12/03/15 0337  . acetaminophen (TYLENOL) suppository 650 mg  650 mg Rectal Q4H PRN Kevan Ny Ditty, MD   650 mg at 12/03/15 0407  . acetaminophen (TYLENOL) tablet 650 mg  650 mg Oral Q4H PRN Rhetta Mura Schorr, NP   650 mg at 12/03/15 1640  . calcium-vitamin D (OSCAL WITH D) 500-200 MG-UNIT per tablet 1 tablet  1 tablet Oral Q breakfast Etta Quill, DO   1 tablet at 12/04/15 1031  . celecoxib (CELEBREX) capsule 200 mg  200 mg Oral Daily Etta Quill, DO   200 mg at 12/04/15 1032  . diazepam (VALIUM) tablet 5 mg  5 mg Oral Q12H PRN Etta Quill, DO   5 mg at 11/28/15 [id number]  . hydrALAZINE (APRESOLINE) injection 20 mg  20 mg Intravenous Q2H PRN Tamala Fothergill, MD  20 mg at 12/04/15 0529  . HYDROcodone-acetaminophen (NORCO/VICODIN) 5-325 MG per tablet 1 tablet  1 tablet Oral Q4H PRN Earnie Larsson, MD   1 tablet at 11/28/15 2240  . HYDROmorphone (DILAUDID) injection 0.5-1 mg  0.5-1 mg Intravenous Q2H PRN Earnie Larsson, MD   0.5 mg at 11/29/15 0036  . hydrOXYzine (ATARAX/VISTARIL) tablet 10 mg  10 mg Oral TID PRN Etta Quill, DO      . labetalol (NORMODYNE,TRANDATE) injection 10-40 mg  10-40 mg Intravenous Q10 min PRN Earnie Larsson, MD   20 mg at 12/04/15 1500  . naloxone [facility]) injection 0.08 mg  0.08 mg Intravenous PRN Earnie Larsson, MD      . ondansetron [facility]) tablet 4 mg  4 mg Oral Q4H PRN Earnie Larsson, MD        Or  . ondansetron [facility]) injection 4 mg  4 mg Intravenous Q4H PRN Earnie Larsson, MD      . oxybutynin (DITROPAN) 5 MG/5ML syrup 5 mg  5 mg Oral QHS Earnie Larsson, MD   5 mg at 12/03/15 2247  . pantoprazole sodium (PROTONIX) 40 mg/20 mL oral suspension 40 mg  40 mg Per Tube Q24H Wynell Balloon, RPH   40 mg at 12/02/15 2005  . promethazine (PHENERGAN) tablet 12.5-25 mg  12.5-25 mg Oral Q4H PRN Earnie Larsson, MD      . psyllium (HYDROCIL/METAMUCIL) packet 1 packet  1 packet Oral BID PRN Kevan Ny Ditty, MD         Discharge Medications: Please see discharge summary for a list of discharge medications.  Relevant Imaging Results:  Relevant Lab Results:  Recent Labs    Additional Information SSN [id number]  Glendon Axe, MSW, LCSWA [phone] 12/04/2015 3:22 PM

## 2015-12-04 NOTE — Progress Notes (Signed)
Speech Language Pathology Treatment: Dysphagia  Patient Details Name: [name] MRN: [id number] DOB: 1922-03-01 Today's Date: 12/04/2015 Time: 1320-1330 SLP Time Calculation (min) (ACUTE ONLY): 10 min  Assessment / Plan / Recommendation Clinical Impression  Pt sitting in recliner at nurse's station and quite sleepy.  Ate lunch with OT assist per report.  Attempted trials of upgraded textures with pt, but demonstrated poor toleration of thin liquids with oral holding and concerns for aspiration (weak throat-clearing post-swallow).  Mod verbal cues needed to maintain attention and wakefulness.  Continue current diet of purees, nectar-thick liquids due to MS primarily.  SLP will continue to follow for dysphagia, cognition.     HPI HPI: pt presents with R Frontotemporoparietal SDH now s/p Crani and evacuation. pt with hx of skin CA, Prostate CA with Bone Mets, L TKA, and Back Surgery.       SLP Plan  Continue with current plan of care     Recommendations  Diet recommendations: Dysphagia 1 (puree);Nectar-thick liquid Liquids provided via: Cup;No straw Medication Administration: Crushed with puree Supervision: Staff to assist with self feeding;Full supervision/cueing for compensatory strategies Compensations: Slow rate;Small sips/bites Postural Changes and/or Swallow Maneuvers: Seated upright 90 degrees              Oral Care Recommendations: Oral care BID Plan: Continue with current plan of care   [name] 12/04/2015, 1:31 PM Tameyah Koch L. Tivis Ringer, [place] CCC/SLP Pager [phone]

## 2015-12-04 NOTE — Care Management Important Message (Signed)
Important Message  Patient Details  Name: [name] MRN: [id number] Date of Birth: 1922/10/23   Medicare Important Message Given:  Yes    Rosangelica Pevehouse P Palak Tercero 12/04/2015, 11:52 AM

## 2015-12-04 NOTE — Progress Notes (Signed)
UA sent with result. Patient's temperature at 101.3 oral, tylenol administered. MD paged r/t result and condition. Awaiting for call back.   Ave Filter, RN

## 2015-12-04 NOTE — Progress Notes (Signed)
Looks considerably better than on Friday. Patient much brighter. More animated. Still somnolent however. He will follow commands bilaterally. Overall strength is stable. Dressing dry.  Progressing slowly but steadily. Continue efforts at therapy. Good candidate for inpatient rehabilitation briefly prior to discharge home.

## 2015-12-04 NOTE — Progress Notes (Signed)
Dr Annette Stable notified of fever of 101.6 and elevated BP. Labetolol 20 mg given and effective. CBC and UA ordered per MD and results obtained. Dr Saintclair Halsted notified of results. Blood cultures and chest xray ordered per Dr Saintclair Halsted. Order place for levaquin 500 mg TB QD to be started pending blood culture results per Dr Saintclair Halsted. Tylenol given for fever at 1900.

## 2015-12-04 NOTE — Clinical Social Work Note (Signed)
Clinical Social Work Assessment  Patient Details  Name: [name] MRN: [id number] Date of Birth: 03/10/1922  Date of referral:  12/04/15               Reason for consult:  Facility Placement, Discharge Planning                Permission sought to share information with:  Facility Sport and exercise psychologist, Family Supports Permission granted to share information::  No (Patient confused.)  Name::     Darla Lesches::  SNFs  Relationship::     Contact Information:     Housing/Transportation Living arrangements for the past 2 months:  Single Family Home Source of Information:  Adult Children Patient Interpreter Needed:  None Criminal Activity/Legal Involvement Pertinent to Current Situation/Hospitalization:  No - Comment as needed Significant Relationships:  Spouse, Adult Children Lives with:  Spouse Do you feel safe going back to the place where you live?  No Need for family participation in patient care:  No (Coment)  Care giving concerns:  Patient's son Shanon Brow is concerned about the patient returning home at discharge given his current situation.   Social Worker assessment / plan:  CSW spoke with the patient's son Shanon Brow to complete assessment as Shanon Brow is listed as primary contact on handoff report. Shanon Brow flew in from [place] this morning. Per Shanon Brow, the patient lives at home with his wife Bethena Roys. Shanon Brow states that Bethena Roys can be verbally abusive to the patient and will attempt to control the patient's discharge plan because she will not want the patient to go to a facility. Shanon Brow states that he would be agreeable to the patient going to SNF at discharge if CIR is not an option as he doesn't think the patient will be safe at home. CSW explained SNF search and placement process and Shanon Brow and answered his questions. CSW will followup with bed offers once available. The patient is currently confused but Shanon Brow plans to discuss the CIR/SNF recommendation with the patient and unit CSW will  followup.   Employment status:  Retired Forensic scientist:  Medicare PT Recommendations:  [place] / Referral to community resources:  [place]  Patient/Family's Response to care:  Patient's son Shanon Brow appears to be happy with the care the patient has received.   Patient/Family's Understanding of and Emotional Response to Diagnosis, Current Treatment, and Prognosis:  The patient's son Shanon Brow appears to have a good understanding of why the patient was admitted and understands what the patient's post DC needs will be.   Emotional Assessment Appearance:  Appears stated age Attitude/Demeanor/Rapport:  Unable to Assess Affect (typically observed):  Unable to Assess (Patient is confused.) Orientation:  Oriented to Self Alcohol / Substance use:  Never Used Psych involvement (Current and /or in the community):  No (Comment)  Discharge Needs  Concerns to be addressed:  Discharge Planning Concerns Readmission within the last 30 days:  No Current discharge risk:  Cognitively Impaired, Chronically ill, Physical Impairment, Other (Patient's son mentions ) Barriers to Discharge:  Continued Medical Work up   [employer] MSW, [place], [place], [id number]

## 2015-12-04 NOTE — Progress Notes (Signed)
Inpatient Rehabilitation  Pt. Very lethargic today, unable to fully participate in rehab program.  Pt. Has SNF back up in case rehab option not available to him.    [facility] Admissions Coordinator Cell [phone] Office [phone]

## 2015-12-04 NOTE — Progress Notes (Signed)
Physical Therapy Treatment Patient Details Name: [name] MRN: [id number] DOB: [birth year] Today's Date: 12/04/2015    History of Present Illness pt presents with R Frontotemporoparietal SDH now s/p Crani and evacuation.  pt with hx of skin CA, Prostate CA with Bone Mets, L TKA, and Back Surgery.      PT Comments    Patient remains sleepy/lethargic and requires max stimulation to attend to task for up to 20 seconds (and then falls asleep). Mod-total assist for all mobility.   Follow Up Recommendations  SNF (anticipate slower progress)     Equipment Recommendations  None recommended by PT    Recommendations for Other Services       Precautions / Restrictions Precautions Precautions: Fall Restrictions Weight Bearing Restrictions: No    Mobility  Bed Mobility Overal bed mobility: Needs Assistance;+2 for physical assistance Bed Mobility: Sit to Supine       Sit to supine: +2 for physical assistance;Total assist   General bed mobility comments: RN assisting with mobility and did not allow pt chance to lift his legs  Transfers Overall transfer level: Needs assistance Equipment used: Rolling walker (2 wheeled) (bed rail) Transfers: Sit to/from [employer] Transfers Sit to Stand: Mod assist Stand pivot transfers: +2 physical assistance;Mod assist;+2 safety/equipment       General transfer comment: stood ~3/4 with one person and pulling on bedrail from seated in recliner. With RW needed assist to move RLE, spontaneously move LLE. very small pivotal steps  Ambulation/Gait             General Gait Details: unable   Stairs            Wheelchair Mobility    Modified Rankin (Stroke Patients Only)       Balance Overall balance assessment: Needs assistance Sitting-balance support: Bilateral upper extremity supported;Feet supported Sitting balance-Leahy Scale: Zero Sitting balance - Comments: decr alertness; when placed in upright/midline  position he feels he is falling forwards and pushes trunk posteriorly; upright in chair ~8 minutes with rest breaks; min to max assist Postural control: Posterior lean Standing balance support: Bilateral upper extremity supported Standing balance-Leahy Scale: Zero                      Cognition Arousal/Alertness: Lethargic Behavior During Therapy: Restless (when awake) Overall Cognitive Status: Impaired/Different from baseline Area of Impairment: Orientation;Attention;Following commands;Safety/judgement;Awareness;Problem solving Orientation Level: Place;Time Current Attention Level: Focused (very short sustained and falls asleep within 15-20 seconds) Memory: Decreased short-term memory Following Commands: Follows one step commands inconsistently;Follows one step commands with increased time Safety/Judgement: Decreased awareness of safety;Decreased awareness of deficits   Problem Solving: Slow processing;Difficulty sequencing;Requires verbal cues;Requires tactile cues General Comments: per RN has been lethargic today, however was awake most of the night    Exercises      General Comments General comments (skin integrity, edema, etc.): Wife and son present. Wife talking over therapist and asked her not to. She complied rest of session      Pertinent Vitals/Pain Pain Assessment: Faces Faces Pain Scale: No hurt    Home Living                      Prior Function            PT Goals (current goals can now be found in the care plan section) Acute Rehab PT Goals Patient Stated Goal: Get better. Time For Goal Achievement: 12/13/15 Progress towards PT goals: Not progressing  toward goals - comment (decr alertness)    Frequency  Min 2X/week    PT Plan Discharge plan needs to be updated;Frequency needs to be updated    Co-evaluation             End of Session Equipment Utilized During Treatment: Gait belt Activity Tolerance: Patient limited by  lethargy Patient left: in bed;with nursing/sitter in room     Time: 1404-1430 PT Time Calculation (min) (ACUTE ONLY): 26 min  Charges:  $Therapeutic Activity: 8-22 mins $Neuromuscular Re-education: 8-22 mins                    G Codes:      Layza Summa December 15, 2015, 2:51 PM Pager [phone]

## 2015-12-04 NOTE — Progress Notes (Signed)
Occupational Therapy Treatment Patient Details Name: [name] MRN: [id number] DOB: Nov 23, 1922 Today's Date: 12/04/2015    History of present illness pt presents with R Frontotemporoparietal SDH now s/p Crani and evacuation.  pt with hx of skin CA, Prostate CA with Bone Mets, L TKA, and Back Surgery.     OT comments  Focus of session on seated grooming and self feeding.  Pt remaining alert throughout full session.  Moderate assistance required using spoon and for cup drinking, cues for safety and to slow pace at times. Pt pleasant and cooperative.  Follow Up Recommendations  CIR;Supervision/Assistance - 24 hour    Equipment Recommendations  3 in 1 bedside comode;Tub/shower bench    Recommendations for Other Services      Precautions / Restrictions Precautions Precautions: Fall       Mobility Bed Mobility                  Transfers                      Balance     Sitting balance-Leahy Scale: Poor                             ADL Overall ADL's : Needs assistance/impaired Eating/Feeding: Moderate assistance;Sitting Eating/Feeding Details (indicate cue type and reason): Pt able to self feed with spoon, monitoring bite size and with cues to swallow before taking next bite. Needs mod assist to brink cup to mouth. Grooming: Wash/dry hands;Wash/dry face;Moderate assistance;Sitting                                 General ADL Comments: Pt remained alert throughout meal.      Vision                 Additional Comments: Pt visually locating desired items on meal tray. Fidgets.   Perception     Praxis      Cognition   Behavior During Therapy: Impulsive Overall Cognitive Status: Impaired/Different from baseline Area of Impairment: Orientation;Following commands;Safety/judgement;Awareness Orientation Level: Time;Place   Memory: Decreased short-term memory  Following Commands: Follows one step commands  inconsistently;Follows one step commands with increased time Safety/Judgement: Decreased awareness of safety;Decreased awareness of deficits     General Comments: Pt requiring cues for safety with eating.    Extremity/Trunk Assessment               Exercises     Shoulder Instructions       General Comments      Pertinent Vitals/ Pain       Pain Assessment: Faces Faces Pain Scale: No hurt  Home Living                                          Prior Functioning/Environment              Frequency Min 2X/week     Progress Toward Goals  OT Goals(current goals can now be found in the care plan section)  Progress towards OT goals: Progressing toward goals     Plan Discharge plan remains appropriate    Co-evaluation                 End of Session  Activity Tolerance Patient tolerated treatment well   Patient Left in chair;with call bell/phone within reach (at nursing station)   Nurse Communication          Time: 1230-1310 OT Time Calculation (min): 40 min  Charges: OT General Charges $OT Visit: 1 Procedure OT Treatments $Self Care/Home Management : 38-52 mins  Malka So 12/04/2015, 1:16 PM  [phone]

## 2015-12-04 NOTE — Clinical Social Work Placement (Signed)
   CLINICAL SOCIAL WORK PLACEMENT  NOTE  Date:  12/04/2015  Patient Details  Name: [name] MRN: [id number] Date of Birth: Jun 04, 1922  Clinical Social Work is seeking post-discharge placement for this patient at the [place] level of care (*CSW will initial, date and re-position this form in  chart as items are completed):  Yes   Patient/family provided with [facility] Work Department's list of facilities offering this level of care within the geographic area requested by the patient (or if unable, by the patient's family).  Yes   Patient/family informed of their freedom to choose among providers that offer the needed level of care, that participate in Medicare, Medicaid or managed care program needed by the patient, have an available bed and are willing to accept the patient.  Yes   Patient/family informed of [facility]'s ownership interest in [facility] and [facility], as well as of the fact that they are under no obligation to receive care at these facilities.  PASRR submitted to EDS on 12/04/15     PASRR number received on 12/04/15     Existing PASRR number confirmed on       FL2 transmitted to all facilities in geographic area requested by pt/family on 12/04/15     FL2 transmitted to all facilities within larger geographic area on       Patient informed that his/her managed care company has contracts with or will negotiate with certain facilities, including the following:            Patient/family informed of bed offers received.  Patient chooses bed at       Physician recommends and patient chooses bed at      Patient to be transferred to   on  .  Patient to be transferred to facility by       Patient family notified on   of transfer.  Name of family member notified:        PHYSICIAN Please sign FL2     Additional Comment:    _______________________________________________ Rozell Searing, LCSW 12/04/2015,  4:33 PM

## 2015-12-05 LAB — GLUCOSE, CAPILLARY: GLUCOSE-CAPILLARY: 104 mg/dL — AB (ref 65–99)

## 2015-12-05 MED ORDER — LEVOFLOXACIN 500 MG PO TABS
500.0000 mg | ORAL_TABLET | Freq: Every day | ORAL | Status: DC
  Administered 2015-12-06: 500 mg via ORAL
  Filled 2015-12-05: qty 1

## 2015-12-05 NOTE — Progress Notes (Signed)
Physical Therapy Treatment Patient Details Name: [name] MRN: [id number] DOB: 08-May-1922 Today's Date: 12/05/2015    History of Present Illness pt presents with R Frontotemporoparietal SDH now s/p Crani and evacuation.  pt with hx of skin CA, Prostate CA with Bone Mets, L TKA, and Back Surgery.      PT Comments    Patient alert and able to walk short distance! Pt very HOH which may explain some perceived impulsivity (i.e. He doesn't hear the instructions). Noted wife had delivered hearing aids, however he was not wearing them. Patient eager to participate; globally weak.   Follow Up Recommendations  CIR (if remains alert and continues to progress)     Equipment Recommendations  None recommended by PT    Recommendations for Other Services       Precautions / Restrictions Precautions Precautions: Fall Precaution Comments: has hearing aids, may need to put them in Restrictions Weight Bearing Restrictions: No    Mobility  Bed Mobility                  Transfers Overall transfer level: Needs assistance Equipment used: Rolling walker (2 wheeled) (bed rail) Transfers: Sit to/from Stand Sit to Stand: Mod assist;Min assist         General transfer comment: x3; each time required less assist; leans posteriorly  Ambulation/Gait Ambulation/Gait assistance: Mod assist;+2 physical assistance;+2 safety/equipment Ambulation Distance (Feet): 8 Feet Assistive device: Rolling walker (2 wheeled) Gait Pattern/deviations: Step-through pattern;Decreased step length - right;Shuffle;Trunk flexed   Gait velocity interpretation: <1.8 ft/sec, indicative of risk for recurrent falls General Gait Details: assist to advance RLE ~75% of steps   Stairs            Wheelchair Mobility    Modified Rankin (Stroke Patients Only)       Balance Overall balance assessment: Needs assistance Sitting-balance support: No upper extremity supported;Feet supported Sitting  balance-Leahy Scale: Poor     Standing balance support: Bilateral upper extremity supported Standing balance-Leahy Scale: Poor Standing balance comment: worked on weight-shifiting x 4 minutes                    Cognition Arousal/Alertness: Awake/alert Behavior During Therapy: Impulsive (? partly due to decr hearing instructions) Overall Cognitive Status: Impaired/Different from baseline Area of Impairment: Attention;Following commands;Safety/judgement;Awareness;Problem solving Orientation Level:  (time not tested) Current Attention Level: Sustained (very HOH)   Following Commands: Follows one step commands inconsistently Safety/Judgement: Decreased awareness of safety;Decreased awareness of deficits Awareness: Intellectual Problem Solving: Slow processing;Difficulty sequencing;Requires verbal cues;Requires tactile cues General Comments: HOH vs impulsive    Exercises      General Comments        Pertinent Vitals/Pain Pain Assessment: Faces Faces Pain Scale: No hurt    Home Living                      Prior Function            PT Goals (current goals can now be found in the care plan section) Acute Rehab PT Goals Patient Stated Goal: Get better. Time For Goal Achievement: 12/13/15 Progress towards PT goals: Progressing toward goals    Frequency  Min 3X/week    PT Plan Discharge plan needs to be updated;Frequency needs to be updated    Co-evaluation             End of Session Equipment Utilized During Treatment: Gait belt Activity Tolerance: Patient limited by fatigue Patient left: in  chair;with call bell/phone within reach;with chair alarm set     Time: 1140-1156 PT Time Calculation (min) (ACUTE ONLY): 16 min  Charges:  $Gait Training: 8-22 mins                    G Codes:      [name] 12/11/2015, 12:12 PM Pager [phone]

## 2015-12-05 NOTE — Progress Notes (Signed)
Pt with elevated temp. Order was clarified on Levaquin to administer after blood culture was drawn.  MD (Dr. Saintclair Halsted) made aware with order to administer Levaquin.

## 2015-12-05 NOTE — Progress Notes (Signed)
Patient doing much better. He is awake and alert. No complaints.  Afebrile. Vitals stable. Motor and sensory function intact.  Looking much better after starting Levaquin for UTI. Continue efforts at mobilization. Discharge plan pending at this point significant versus home discharge versus rehabilitation.

## 2015-12-05 NOTE — Clinical Social Work Note (Signed)
Family has completed admissions paperwork with [facility].   Glendon Axe, MSW, LCSWA [phone] 12/05/2015 1:49 PM

## 2015-12-05 NOTE — Progress Notes (Signed)
Rehab admissions - Currently rehab beds are full.  I left a message with patient's wife, but no return call yet.  Will continue to follow progress.  RC:[phone]

## 2015-12-05 NOTE — Clinical Social Work Note (Signed)
Patient has a bed at [facility]. CSW left message with pt's wife, Tomasa Hosteller.   CSW remains available as needed.   Glendon Axe, MSW, LCSWA [phone] 12/05/2015 12:06 PM

## 2015-12-06 MED ORDER — LEVOFLOXACIN 500 MG PO TABS: 500.0000 mg | ORAL_TABLET | Freq: Every day | ORAL | Status: DC

## 2015-12-06 NOTE — Progress Notes (Addendum)
No current fevers. Patient remains much more bright and interactive. No symptoms of weakness or sensory loss.  Currently afebrile. Vitals are stable. Awake and alert. Oriented and recently appropriate. Motor and sensory function intact. Wound clean and dry.  Patient responding well to Levaquin for UTI. Ready for discharge to skilled nursing facility when bed available

## 2015-12-06 NOTE — Progress Notes (Signed)
Speech Language Pathology Treatment: Dysphagia;Cognitive-Linquistic  Patient Details Name: [name] MRN: [id number] DOB: 1922/08/20 Today's Date: 12/06/2015 Time: 1139-1208 SLP Time Calculation (min) (ACUTE ONLY): 29 min  Assessment / Plan / Recommendation Clinical Impression  Pt with improved alertness today.  Family present.  Continues with deficits in sustained and selective attention, requiring mod verbal cues to persist through self-feeding and respond to questions.  Trials of thin liquids continue to elicit frequent throat-clearing, intermittent cough.  Pt requires mod verbal/tactile cues to attend to POs, initiate swallow sequence, and reduce oral holding, spillage.  Pt may D/C soon to SNF - recommend SLP f/u at next facility to address cognition and dysphagia.  D/W wife, son.    HPI HPI: pt presents with R Frontotemporoparietal SDH now s/p Crani and evacuation. pt with hx of skin CA, Prostate CA with Bone Mets, L TKA, and Back Surgery.       SLP Plan  Continue with current plan of care     Recommendations  Diet recommendations: Dysphagia 1 (puree);Nectar-thick liquid Liquids provided via: Cup;No straw Medication Administration: Crushed with puree Supervision: Staff to assist with self feeding;Full supervision/cueing for compensatory strategies Compensations: Slow rate;Small sips/bites Postural Changes and/or Swallow Maneuvers: Seated upright 90 degrees              Oral Care Recommendations: Oral care BID Follow up Recommendations: Skilled Nursing facility Plan: Continue with current plan of care   [name] 12/06/2015, 12:12 PM

## 2015-12-06 NOTE — Progress Notes (Signed)
Physical Therapy Treatment Patient Details Name: [name] MRN: [id number] DOB: 23-Apr-1922 Today's Date: 12/06/2015    History of Present Illness pt presents with R Frontotemporoparietal SDH now s/p Crani and evacuation.  pt with hx of skin CA, Prostate CA with Bone Mets, L TKA, and Back Surgery.      PT Comments    Patient less alert with difficulty processing requests (compared to 12/6). Able to walk, however required incr assist and with worse posture.  Follow Up Recommendations  SNF     Equipment Recommendations  None recommended by PT    Recommendations for Other Services       Precautions / Restrictions Precautions Precautions: Fall Precaution Comments: has hearing aids, may need to put them in Restrictions Weight Bearing Restrictions: No    Mobility  Bed Mobility Overal bed mobility: Needs Assistance Bed Mobility: Rolling;Sidelying to Sit Rolling: Min assist Sidelying to sit: Max assist       General bed mobility comments: to Rt, incr difficulty pushing up with RUE and achieving midline (leans Rt and posterior)  Transfers Overall transfer level: Needs assistance Equipment used: Rolling walker (2 wheeled) Transfers: Sit to/from Stand Sit to Stand: Mod assist         General transfer comment: leans posteriorly  Ambulation/Gait Ambulation/Gait assistance: Mod assist;+2 physical assistance;+2 safety/equipment Ambulation Distance (Feet): 8 Feet Assistive device: Rolling walker (2 wheeled) Gait Pattern/deviations: Step-through pattern;Decreased step length - right;Shuffle;Trunk flexed   Gait velocity interpretation: <1.8 ft/sec, indicative of risk for recurrent falls General Gait Details: assist to advance RLE 100% of steps   Stairs            Wheelchair Mobility    Modified Rankin (Stroke Patients Only)       Balance   Sitting-balance support: Bilateral upper extremity supported;Feet supported Sitting balance-Leahy Scale:  Zero Sitting balance - Comments: worked on balance EOB x 4 minutes with incr resistance to wt shift to his LT Postural control: Posterior lean;Right lateral lean Standing balance support: Bilateral upper extremity supported Standing balance-Leahy Scale: Zero                      Cognition Arousal/Alertness: Awake/alert Behavior During Therapy: Impulsive (? partly due to decr hearing instructions) Overall Cognitive Status: Impaired/Different from baseline Area of Impairment: Attention;Following commands;Safety/judgement;Awareness;Problem solving   Current Attention Level: Sustained;Focused (very HOH) Memory: Decreased short-term memory;Decreased recall of precautions Following Commands: Follows one step commands inconsistently Safety/Judgement: Decreased awareness of safety;Decreased awareness of deficits Awareness: Intellectual Problem Solving: Slow processing;Difficulty sequencing;Requires verbal cues;Requires tactile cues General Comments: worse comprehension today; incr impulsivity    Exercises      General Comments General comments (skin integrity, edema, etc.): wife asking same questions pre and post-session with no recognition that we had just discussed      Pertinent Vitals/Pain Pain Assessment: Faces Faces Pain Scale: No hurt    Home Living                      Prior Function            PT Goals (current goals can now be found in the care plan section) Acute Rehab PT Goals Patient Stated Goal: Get better. Time For Goal Achievement: 12/13/15 Progress towards PT goals: Not progressing toward goals - comment    Frequency  Min 3X/week    PT Plan Discharge plan needs to be updated;Frequency needs to be updated    Co-evaluation  End of Session Equipment Utilized During Treatment: Gait belt Activity Tolerance: Patient limited by fatigue Patient left: in chair;with call bell/phone within reach;with chair alarm set;with  family/visitor present     Time: 1032-1101 PT Time Calculation (min) (ACUTE ONLY): 29 min  Charges:  $Gait Training: 8-22 mins $Therapeutic Activity: 8-22 mins                    G Codes:      Jimi Schappert December 17, 2015, 11:44 AM Pager [phone]

## 2015-12-06 NOTE — Discharge Summary (Signed)
Physician Discharge Summary  Patient ID: [name] MRN: [id number] DOB/AGE: January 18, 1922 81 y.o.  Admit date: 11/27/2015 Discharge date: 12/06/2015  Admission Diagnoses:  Discharge Diagnoses:  Principal Problem:   Epidural hematoma ([place]) Active Problems:   HTN (hypertension)   Frequent falls   Right sided weakness   Subdural hematoma [facility])   Discharged Condition: good  Hospital Course: The patient was aspital for evatoma.  This subdural hematoma increased in size with increasing headaches and mass effect.  Patient was taken to the operating room where an uncomplicated right-sided craniotomy and evacuation of l hematoma was performed.  Postoperatively the patient has progressed slowly but steadily.  His postoperative course was complicated by a urinary tract infection which has responded to Levaquin.  Currently he is purchased paining in therapy and making good progress.  He is ready for discharge to skilled nursing for further convalescence.  Consults:   Significant Diagnostic Studies:   Treatments:   Discharge Exam: Blood pressure 178/78, pulse 89, temperature 97.4 F (36.3 C), temperature source Oral, resp. rate 18, height 5\' 2"  (1.575 m), weight 86.6 kg (190 lb 14.7 oz), SpO2 94 %. The patient is awake and alert.  He is oriented and appropriate.  His speech is fluent.  His motor examination is 5/5 bilaterally without pronator drift.  His wound is healing well.  Chest and abdomen are benign.  Disposition: 01-Home or Self Care     Medication List    TAKE these medications        calcium-vitamin D 500-200 MG-UNIT tablet  Commonly known as:  OSCAL WITH D  Take 1 tablet by mouth daily with breakfast.     carisoprodol 350 MG tablet  Commonly known as:  SOMA  Take 0.5-1 tablets (175-350 mg total) by mouth daily as needed.     celecoxib 200 MG capsule  Commonly known as:  CELEBREX  Take 1 capsule (200 mg total) by mouth daily.     diazepam 5 MG tablet  Commonly  known as:  VALIUM  Take 1 tablet (5 mg total) by mouth every 12 (twelve) hours as needed for anxiety.     donepezil 5 MG tablet  Commonly known as:  ARICEPT  Take 1 tablet (5 mg total) by mouth at bedtime.     hydrOXYzine 10 MG tablet  Commonly known as:  ATARAX/VISTARIL  Take 10 mg by mouth 3 (three) times daily as needed for itching. Take two daily     leuprolide 3.75 MG injection  Commonly known as:  LUPRON  Inject 3.75 mg into the muscle once. As needed based on PSA     levofloxacin 500 MG tablet  Commonly known as:  LEVAQUIN  Take 1 tablet (500 mg total) by mouth daily.     oxybutynin 5 MG 24 hr tablet  Commonly known as:  DITROPAN-XL  Take 5 mg by mouth at bedtime.           Follow-up Information    Follow up with Charlie Pitter, MD.   Specialty:  Neurosurgery   Contact information:   [address] [place] [address] [place] [address] [phone]       Signed: Charlie Pitter 12/06/2015, 12:32 PM

## 2015-12-06 NOTE — Progress Notes (Signed)
Discharge orders received. Pt dressed and belongings packed. IV removed. Staples removed per order. PTAR picked up patient for transport to [facility]. Report called to [facility]. Wife informed of PTAR's arrival and verbalized understanding. Wife stated that she would inform son.

## 2015-12-08 ENCOUNTER — Telehealth: Payer: Self-pay | Admitting: *Deleted

## 2015-12-08 NOTE — Clinical Social Work Placement (Signed)
   CLINICAL SOCIAL WORK PLACEMENT  NOTE  Date:  12/08/2015  Patient Details  Name: [name] MRN: [id number] Date of Birth: 04/22/1922  Clinical Social Work is seeking post-discharge placement for this patient at the [place] level of care (*CSW will initial, date and re-position this form in  chart as items are completed):  Yes   Patient/family provided with [place] Work Department's list of facilities offering this level of care within the geographic area requested by the patient (or if unable, by the patient's family).  Yes   Patient/family informed of their freedom to choose among providers that offer the needed level of care, that participate in Medicare, Medicaid or managed care program needed by the patient, have an available bed and are willing to accept the patient.  Yes   Patient/family informed of [place]'s ownership interest in [facility] and [facility], as well as of the fact that they are under no obligation to receive care at these facilities.  PASRR submitted to EDS on 12/04/15     PASRR number received on 12/04/15     Existing PASRR number confirmed on       FL2 transmitted to all facilities in geographic area requested by pt/family on 12/04/15     FL2 transmitted to all facilities within larger geographic area on       Patient informed that his/her managed care company has contracts with or will negotiate with certain facilities, including the following:        Yes   Patient/family informed of bed offers received.  Patient chooses bed at [facility]     Physician recommends and patient chooses bed at      Patient to be transferred to [facility] on 12/06/15.  Patient to be transferred to facility by Ambulance  [name])     Patient family notified on 12/06/15 (Patient stated that he notified his wife; she has completed paperwork with [facility].) of transfer.  Name of family member notified:  Patient  notified wife [name].     PHYSICIAN Please sign FL2, Please prepare priority discharge summary, including medications, Please prepare prescriptions     Additional Comment: Ok per MD for d/c today to SNF for rehab.  Nursing notified to call report to facility and d/c summary sent.  EMS forms prepared. Patient noted to be sitting up in bed and indicated he was ready to leave. He has notified his wife of d/c; she has completed paperwork at [facility] and nursing indicated that she was aware of d/c as well.  No further CSW needs identified. CSW signing off.     _______________________________________________ Williemae Area, LCSW 12/06/2015

## 2015-12-08 NOTE — Telephone Encounter (Signed)
Pt was on TCM list admitted for Epidural hematoma D/C 12/06/15 will f/u with Dr. Trenton Gammon...Johny Chess

## 2015-12-09 LAB — CULTURE, BLOOD (ROUTINE X 2)
CULTURE: NO GROWTH
Culture: NO GROWTH

## 2015-12-12 ENCOUNTER — Non-Acute Institutional Stay (SKILLED_NURSING_FACILITY): Payer: Medicare Other | Admitting: Internal Medicine

## 2015-12-12 DIAGNOSIS — S064X9A Epidural hemorrhage with loss of consciousness of unspecified duration, initial encounter: Secondary | ICD-10-CM | POA: Diagnosis not present

## 2015-12-12 DIAGNOSIS — S064XAA Epidural hemorrhage with loss of consciousness status unknown, initial encounter: Secondary | ICD-10-CM

## 2015-12-12 DIAGNOSIS — K219 Gastro-esophageal reflux disease without esophagitis: Secondary | ICD-10-CM | POA: Diagnosis not present

## 2015-12-12 DIAGNOSIS — N390 Urinary tract infection, site not specified: Secondary | ICD-10-CM | POA: Diagnosis not present

## 2015-12-12 DIAGNOSIS — R5381 Other malaise: Secondary | ICD-10-CM | POA: Diagnosis not present

## 2015-12-12 DIAGNOSIS — B001 Herpesviral vesicular dermatitis: Secondary | ICD-10-CM | POA: Diagnosis not present

## 2015-12-12 DIAGNOSIS — I10 Essential (primary) hypertension: Secondary | ICD-10-CM | POA: Diagnosis not present

## 2015-12-12 DIAGNOSIS — D72829 Elevated white blood cell count, unspecified: Secondary | ICD-10-CM | POA: Diagnosis not present

## 2015-12-12 DIAGNOSIS — E871 Hypo-osmolality and hyponatremia: Secondary | ICD-10-CM | POA: Diagnosis not present

## 2015-12-12 DIAGNOSIS — N400 Enlarged prostate without lower urinary tract symptoms: Secondary | ICD-10-CM | POA: Diagnosis not present

## 2015-12-12 NOTE — Progress Notes (Signed)
Patient ID: [name], male   DOB: June 04, 1922, 81 y.o.   MRN: [id number]     Facility: [facility]    PCP: Hoyt Koch, MD  Code Status: DNR  No Known Allergies  Chief Complaint  Patient presents with  . New Admit To SNF     HPI:  81 y.o. patient is here for short term rehabilitation post hospital admission from 11/27/15-12/06/15 with epidural hematoma. He underwent right sided craniotomy and evacuation of hematoma. He had UTI post operatively and was started on antibiotics. He is seen in his room today with his wife and son present. He complaints of slight headache this am.   Review of Systems:  Constitutional: Negative for fever and diaphoresis.  HENT: Negative for headache, congestion, nasal discharge Eyes: Negative for blurred vision, double vision and discharge.  Respiratory: positive for dry cough. Negative for shortness of breath and wheezing.   Cardiovascular: Negative for chest pain, palpitations, leg swelling.  Gastrointestinal: positive for heartburn. Negative for nausea, vomiting, abdominal pain. Genitourinary: Negative for dysuria Musculoskeletal: Negative for fall Skin: Negative for itching, rash.  Neurological: Negative for dizziness Psychiatric/Behavioral: Negative for depression   Past Medical History  Diagnosis Date  . OTH MALIG NEOPLASM SKIN OTH\T\UNSPEC PARTS FACE 02/10/2009  . HYPERLIPIDEMIA 08/19/2007  . 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 ACTINIC KERATOSIS, EAR, LEFT 06/13/2008  . PROSTATE SPECIFIC ANTIGEN, ELEVATED, CHRONIC 06/15/2010  . History of prostate cancer 2001--  MILD FORM--  NO TX---  FOLLOWED BY DR [facility]  . BPH (benign prostatic hypertrophy)   . Carotid stenosis, asymptomatic BILATERAL MILD ICA STENOSIS   < 50%    PER CAROTID DUPLEX  09-21-2010  . Arthritis   . Colon polyp   . Irritable bowel syndrome   . Prostate cancer metastatic to bone [facility])    Past Surgical  History  Procedure Laterality Date  . Total knee arthroplasty  02-11-2006     left knee  oa  . Transurethral resection of prostate  07-29-2000    bph w/ boo  . Lumbar laminotomy and foraminotomy bilaterally  05-12-2007    L2 - 3 ;  SPONDYLOSIS W/ STENOSIS  . Cardiovascular stress test  09-20-2010  DR BENSIMHON    NORMAL NUCLEAR STUDY/ NO ISCHEMIA/ EF 68%  . Prostate biopsy  07/16/2012    Procedure: PROSTATE BIOPSY;  Surgeon: Malka So, MD;  Location: [facility];  Service: Urology;  Laterality: N/A;  WITH ULTRASOUND ([facility] ULTRA SOUND TECH COMING) OWER  . Transurethral resection of prostate  07/16/2012    Procedure: TRANSURETHRAL RESECTION OF THE PROSTATE (TURP);  Surgeon: Malka So, MD;  Location: [facility];  Service: Urology;;  with gyrus  . Craniotomy Right 11/28/2015    Procedure: CRANIOTOMY HEMATOMA EVACUATION SUBDURAL;  Surgeon: Earnie Larsson, MD;  Location: [place] [facility];  Service: Neurosurgery;  Laterality: Right;  CRANIOTOMY HEMATOMA EVACUATION SUBDURAL   Social History:   reports that he has never smoked. He has never used smokeless tobacco. He reports that he does not drink alcohol or use illicit drugs.  Family History  Problem Relation Age of Onset  . Arthritis Mother   . Cancer Father   . Prostate cancer Father   . Colon cancer Mother     Died at age 69  . Colon polyps Neg Hx     Medications:   Medication List  This list is accurate as of: 12/12/15  2:10 PM.  Always use your most recent med list.               calcium-vitamin D 500-200 MG-UNIT tablet  Commonly known as:  OSCAL WITH D  Take 1 tablet by mouth daily with breakfast.     carisoprodol 350 MG tablet  Commonly known as:  SOMA  Take 0.5-1 tablets (175-350 mg total) by mouth daily as needed.     celecoxib 200 MG capsule  Commonly known as:  CELEBREX  Take 1 capsule (200 mg total) by mouth daily.     diazepam 5 MG tablet  Commonly known as:   VALIUM  Take 1 tablet (5 mg total) by mouth every 12 (twelve) hours as needed for anxiety.     donepezil 5 MG tablet  Commonly known as:  ARICEPT  Take 1 tablet (5 mg total) by mouth at bedtime.     hydrOXYzine 10 MG tablet  Commonly known as:  ATARAX/VISTARIL  Take 10 mg by mouth 3 (three) times daily as needed for itching. Take two daily     leuprolide 3.75 MG injection  Commonly known as:  LUPRON  Inject 3.75 mg into the muscle once. As needed based on PSA     levofloxacin 500 MG tablet  Commonly known as:  LEVAQUIN  Take 1 tablet (500 mg total) by mouth daily.     oxybutynin 5 MG 24 hr tablet  Commonly known as:  DITROPAN-XL  Take 5 mg by mouth at bedtime.         Physical Exam: Filed Vitals:   12/12/15 1407  BP: 157/66  Pulse: 80  Temp: 98.6 F (37 C)  Resp: 18  SpO2: 97%    General- elderly male, in no acute distress Head- normocephalic, atraumatic, has hearing aids Nose- no maxillary or frontal sinus tenderness, no nasal discharge Throat- moist mucus membrane Eyes- PERRLA, EOMI, no pallor, no icterus Neck- no cervical lymphadenopathy Cardiovascular- normal s1,s2, no murmur Respiratory- bilateral clear to auscultation Abdomen- bowel sounds present, soft, non tender Musculoskeletal- able to move all 4 extremities, leg weakness > upper extremity weakness Neurological- alert and oriented Skin- warm and dry, cold sore to his lips   Labs reviewed: Basic Metabolic Panel:  Recent Labs  10/30/15 1618 11/27/15 2009 11/27/15 2015 11/29/15 0425  NA 132* 127* 129* 129*  K 3.8 4.2 4.2 4.6  CL 98 91* 92* 99*  CO2 25 26  --  24  GLUCOSE 106* 115* 113* 119*  BUN 20 22* 25* 18  CREATININE 0.90 1.07 1.10 0.85  CALCIUM 9.5 9.9  --  8.8*   Liver Function Tests:  Recent Labs  10/30/15 1618 11/27/15 2009 11/29/15 0425  AST 22 31 36  ALT 15 19 14*  ALKPHOS 77 88 61  BILITOT 0.4 0.7 1.1  PROT 7.0 6.9 5.9*  ALBUMIN 4.2 4.4 3.5   No results for  input(s): LIPASE, AMYLASE in the last 8760 hours. No results for input(s): AMMONIA in the last 8760 hours. CBC:  Recent Labs  12/27/14 1236 11/27/15 2009 11/27/15 2015 11/29/15 0425 12/04/15 1653  WBC 7.3 9.6  --  12.5* 12.5*  NEUTROABS 4.7 7.5  --   --  9.5*  HGB 13.3 13.4 14.3 10.8* 10.2*  HCT 40.7 38.7* 42.0 31.7* 30.4*  MCV 88.7 85.6  --  85.7 86.6  PLT 187.0 182  --  174 261   Cardiac Enzymes: No results for input(s):  CKTOTAL, CKMB, CKMBINDEX, TROPONINI in the last 8760 hours. BNP: Invalid input(s): POCBNP CBG:  Recent Labs  12/04/15 1206 12/05/15 1122  GLUCAP 99 104*    Radiological Exams: Ct Head Wo Contrast  11/28/2015  CLINICAL DATA:  Fall,  subdural/epidural hemorrhage EXAM: CT HEAD WITHOUT CONTRAST TECHNIQUE: Contiguous axial images were obtained from the base of the skull through the vertex without intravenous contrast. COMPARISON:  11/27/2015 FINDINGS: Again noted acute right hemispheric subdural hemorrhage measures 1.5 cm maximum thickness. There is mild mass effect on the right hemisphere. Again noted about 7 mm right to left midline shift. There is no intraventricular hemorrhage. Stable small amount of subdural blood along the interhemispheric fissure and tentorium. No new hemorrhage is noted. No definite acute cortical infarction. Stable atrophy and chronic white matter disease P IMPRESSION: Again noted acute right hemispheric subdural hemorrhage measures 1.5 cm maximum thickness. There is mild mass effect on the right hemisphere. Again noted about 7 mm right to left midline shift. There is no intraventricular hemorrhage. Stable small amount of subdural blood along the interhemispheric fissure and tentorium. No new hemorrhage is noted. Electronically Signed   By: Lahoma Crocker M.D.   On: 11/28/2015 11:03   Ct Head Wo Contrast  11/27/2015  CLINICAL DATA:  Right-sided weakness. EXAM: CT HEAD WITHOUT CONTRAST TECHNIQUE: Contiguous axial images were obtained from the  base of the skull through the vertex without intravenous contrast. COMPARISON:  PET-CT 05/03/2015 FINDINGS: There is extra-axial hyperdense material along the right frontal cerebral convexity measuring 6 mm in thickness with an area demonstrating a convex margin. There is slight hyperdensity of the left tentorium. There is 2 mm of right-to-left midline shift. There is no evidence of a cortical-based area of acute infarction. There is generalized cerebral atrophy. There is periventricular white matter low attenuation likely secondary to microangiopathy. The ventricles and sulci are appropriate for the patient's age. The basal cisterns are patent. Visualized portions of the orbits are unremarkable. The visualized portions of the paranasal sinuses and mastoid air cells are unremarkable. Cerebrovascular atherosclerotic calcifications are noted. The osseous structures are unremarkable. There is soft tissue swelling along the right frontotemporal calvarium. IMPRESSION: 1. Extra-axial hyperdense material along the right frontal cerebral convexity measuring 6 mm in thickness likely representing acute subdural hemorrhage, but there is an area demonstrating a convex margin which could reflect a epidural hematoma. Slight hyperdensity of the left tentorium likely representing a small amount of subdural blood. Critical Value/emergent results were called by telephone at the time of interpretation on 11/27/2015 at 8:44 pm to Dr. Aram Beecham, who verbally acknowledged these results. Electronically Signed   By: Kathreen Devoid   On: 11/27/2015 20:45   Mr Jodene Nam Head Wo Contrast  11/28/2015  CLINICAL DATA:  Initial evaluation for acute trauma, fall, transient right leg weakness. EXAM: MRI HEAD WITHOUT CONTRAST MRA HEAD WITHOUT CONTRAST TECHNIQUE: Multiplanar, multiecho pulse sequences of the brain and surrounding structures were obtained without intravenous contrast. Angiographic images of the head were obtained using MRA technique without  contrast. COMPARISON:  Prior CT from earlier the same day. FINDINGS: MRI HEAD FINDINGS Study is degraded by motion artifact. Diffuse prominence of the CSF containing spaces is compatible with generalized age-related cerebral atrophy. Patchy and confluent T2/FLAIR hyperintensity within the periventricular and deep white matter both cerebral hemispheres most consistent with chronic small vessel ischemic changes. No evidence for acute infarct. Normal intravascular flow voids are maintained. Acute subdural hematoma overlying the right cerebral convexity again seen, measuring up to 17  mm. Small amount of subdural blood extends along the falx and tentorium. Blood extends around the right cerebellar hemisphere. Trace subdural blood overlies the left cerebral convexity as well. There is associated 7 mm of right-to-left shift. Partial effacement of the right lateral ventricle. Basilar cisterns remain patent. No hydrocephalus. No mass lesion identified. Craniocervical junction within normal limits. Multilevel degenerative spondylolysis present within the visualized upper cervical spine. Degenerative thickening present within the tectorial membrane. Pituitary gland within normal limits. No acute abnormality about the orbits. Sequelae of prior bilateral lens extraction. Scattered mucosal thickening within the paranasal sinuses. No mastoid effusion. Inner ear structures within normal limits. Bone marrow signal intensity normal. No scalp soft tissue abnormality. MRA HEAD FINDINGS ANTERIOR CIRCULATION: Visualized distal cervical segments of the internal carotid arteries are widely patent with antegrade flow. The petrous, cavernous, and supraclinoid segments are widely patent. A1 segments, anterior communicating artery common anterior slurred well arteries well opacified bilaterally. M1 segments patent without stenosis or occlusion. MCA bifurcations within normal limits. Distal MCA branches symmetric and well opacified. Distal  small vessel atheromatous irregularity within the MCA branches bilaterally. Vertebral arteries are patent to the vertebrobasilar junction. Posterior inferior cerebral arteries patent bilaterally. Basilar artery widely patent. Superior cerebellar arteries opacified proximally. Right P1 and P2 segments widely patent. Fetal origin of the left PCA. Left PCA itself is well opacified. No convincing evidence for aneurysm. Minimal irregularity at the origin of the ophthalmic arteries favored to be artifactual in nature related to motion. IMPRESSION: MRI HEAD IMPRESSION: 1. Acute right holo hemispheric subdural hematoma measuring up to 17 mm in maximal thickness with associated 7 mm of right-to-left shift. No hydrocephalus. 2. Trace subdural blood extending along the falx, tentorium, and overlying the left cerebral hemisphere as well. 3. No other acute intracranial process.  No infarct. 4. Atrophy with moderate chronic small vessel ischemic disease. MRA HEAD IMPRESSION: 1. Negative intracranial MRA. No large or proximal arterial branch occlusion. No correctable or high-grade stenosis. 2. Mild distal branch atheromatous irregularity within the MCA branches bilaterally. Electronically Signed   By: Jeannine Boga M.D.   On: 11/28/2015 00:28   Mr Brain Wo Contrast  11/28/2015  CLINICAL DATA:  Initial evaluation for acute trauma, fall, transient right leg weakness. EXAM: MRI HEAD WITHOUT CONTRAST MRA HEAD WITHOUT CONTRAST TECHNIQUE: Multiplanar, multiecho pulse sequences of the brain and surrounding structures were obtained without intravenous contrast. Angiographic images of the head were obtained using MRA technique without contrast. COMPARISON:  Prior CT from earlier the same day. FINDINGS: MRI HEAD FINDINGS Study is degraded by motion artifact. Diffuse prominence of the CSF containing spaces is compatible with generalized age-related cerebral atrophy. Patchy and confluent T2/FLAIR hyperintensity within the  periventricular and deep white matter both cerebral hemispheres most consistent with chronic small vessel ischemic changes. No evidence for acute infarct. Normal intravascular flow voids are maintained. Acute subdural hematoma overlying the right cerebral convexity again seen, measuring up to 17 mm. Small amount of subdural blood extends along the falx and tentorium. Blood extends around the right cerebellar hemisphere. Trace subdural blood overlies the left cerebral convexity as well. There is associated 7 mm of right-to-left shift. Partial effacement of the right lateral ventricle. Basilar cisterns remain patent. No hydrocephalus. No mass lesion identified. Craniocervical junction within normal limits. Multilevel degenerative spondylolysis present within the visualized upper cervical spine. Degenerative thickening present within the tectorial membrane. Pituitary gland within normal limits. No acute abnormality about the orbits. Sequelae of prior bilateral lens extraction. Scattered mucosal thickening  within the paranasal sinuses. No mastoid effusion. Inner ear structures within normal limits. Bone marrow signal intensity normal. No scalp soft tissue abnormality. MRA HEAD FINDINGS ANTERIOR CIRCULATION: Visualized distal cervical segments of the internal carotid arteries are widely patent with antegrade flow. The petrous, cavernous, and supraclinoid segments are widely patent. A1 segments, anterior communicating artery common anterior slurred well arteries well opacified bilaterally. M1 segments patent without stenosis or occlusion. MCA bifurcations within normal limits. Distal MCA branches symmetric and well opacified. Distal small vessel atheromatous irregularity within the MCA branches bilaterally. Vertebral arteries are patent to the vertebrobasilar junction. Posterior inferior cerebral arteries patent bilaterally. Basilar artery widely patent. Superior cerebellar arteries opacified proximally. Right P1 and P2  segments widely patent. Fetal origin of the left PCA. Left PCA itself is well opacified. No convincing evidence for aneurysm. Minimal irregularity at the origin of the ophthalmic arteries favored to be artifactual in nature related to motion. IMPRESSION: MRI HEAD IMPRESSION: 1. Acute right holo hemispheric subdural hematoma measuring up to 17 mm in maximal thickness with associated 7 mm of right-to-left shift. No hydrocephalus. 2. Trace subdural blood extending along the falx, tentorium, and overlying the left cerebral hemisphere as well. 3. No other acute intracranial process.  No infarct. 4. Atrophy with moderate chronic small vessel ischemic disease. MRA HEAD IMPRESSION: 1. Negative intracranial MRA. No large or proximal arterial branch occlusion. No correctable or high-grade stenosis. 2. Mild distal branch atheromatous irregularity within the MCA branches bilaterally. Electronically Signed   By: Jeannine Boga M.D.   On: 11/28/2015 00:28     Assessment/Plan  Physical deconditioning Will have him work with physical therapy and occupational therapy team to help with gait training and muscle strengthening exercises.fall precautions. Skin care. Encourage to be out of bed.   Epidural hematoma S/p craniotomy. Has f/u with neurosurgery tomorrow. Will have patient work with PT/OT as tolerated to regain strength and restore function.  Fall precautions are in place.  UTI To complete his levaquin today, monitor clinically, maintain hydration, check cbc with diff  Leukocytosis With UTI, completes antibiotic today, check cbc with diff  Reflux disease Start omeprazole 20 mg daily and monitor  Hyponatremia Monitor BMP  HTN Elevated SBP, monitor bp readings. Continue clonidine 0.1 mg q8h prn for elevated BP.   BPH Continue ditropan, has hx of prostate cancer. Will need to make f/u with urology  Cold sore Start abreva qid for a week  Goals of care: short term rehabilitation   Labs/tests  ordered: cbc with diff, cmp  Family/ staff Communication: reviewed care plan with patient and nursing supervisor    Blanchie Serve, MD  [facility] [phone] (Monday-Friday 8 am - 5 pm) [phone] (afterhours)

## 2015-12-13 ENCOUNTER — Ambulatory Visit: Payer: Medicare Other | Admitting: Internal Medicine

## 2015-12-13 ENCOUNTER — Ambulatory Visit: Payer: Medicare Other | Admitting: Neurology

## 2015-12-13 LAB — CBC AND DIFFERENTIAL
HCT: 30 % — AB (ref 41–53)
HEMOGLOBIN: 9.7 g/dL — AB (ref 13.5–17.5)
PLATELETS: 355 10*3/uL (ref 150–399)
WBC: 8.5 10^3/mL

## 2015-12-13 LAB — BASIC METABOLIC PANEL
BUN: 22 mg/dL — AB (ref 4–21)
CREATININE: 0.9 mg/dL (ref 0.6–1.3)
Glucose: 89 mg/dL
POTASSIUM: 3.6 mmol/L (ref 3.4–5.3)
Sodium: 139 mmol/L (ref 137–147)

## 2015-12-22 ENCOUNTER — Non-Acute Institutional Stay (SKILLED_NURSING_FACILITY): Payer: Medicare Other | Admitting: Adult Health

## 2015-12-22 ENCOUNTER — Encounter: Payer: Self-pay | Admitting: Adult Health

## 2015-12-22 DIAGNOSIS — S064X9A Epidural hemorrhage with loss of consciousness of unspecified duration, initial encounter: Secondary | ICD-10-CM | POA: Diagnosis not present

## 2015-12-22 DIAGNOSIS — I10 Essential (primary) hypertension: Secondary | ICD-10-CM

## 2015-12-22 DIAGNOSIS — R1314 Dysphagia, pharyngoesophageal phase: Secondary | ICD-10-CM | POA: Insufficient documentation

## 2015-12-22 DIAGNOSIS — N400 Enlarged prostate without lower urinary tract symptoms: Secondary | ICD-10-CM | POA: Insufficient documentation

## 2015-12-22 DIAGNOSIS — R413 Other amnesia: Secondary | ICD-10-CM

## 2015-12-22 DIAGNOSIS — K219 Gastro-esophageal reflux disease without esophagitis: Secondary | ICD-10-CM

## 2015-12-22 DIAGNOSIS — F39 Unspecified mood [affective] disorder: Secondary | ICD-10-CM | POA: Diagnosis not present

## 2015-12-22 DIAGNOSIS — S064XAA Epidural hemorrhage with loss of consciousness status unknown, initial encounter: Secondary | ICD-10-CM

## 2015-12-22 DIAGNOSIS — M47896 Other spondylosis, lumbar region: Secondary | ICD-10-CM

## 2015-12-22 DIAGNOSIS — C61 Malignant neoplasm of prostate: Secondary | ICD-10-CM | POA: Diagnosis not present

## 2015-12-22 NOTE — Progress Notes (Signed)
Patient ID: [name], male   DOB: 03-04-1922, 81 y.o.   MRN: [id number]    Facility: [facility]      No Known Allergies  Chief Complaint  Patient presents with  . Medical Management of Chronic Issues  . Acute Visit    staff concerns     HPI:  He is a resident of this facility being seen for the management of his chronic illnesses. He reported to staff that he is having double vision. He is a poor historian; but tells me that he has double vision periodically. He does not know how long he has had the visual disturbance. He is denying any headaches; blurring of vision or seeing floaters.  He states he is feeling good otherwise. He was seen by urology his ditropan will be stopped. There were no other changes ordered by urology on 12-18-15.     Past Medical History  Diagnosis Date  . OTH MALIG NEOPLASM SKIN OTH\T\UNSPEC PARTS FACE 02/10/2009  . HYPERLIPIDEMIA 08/19/2007  . 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 ACTINIC KERATOSIS, EAR, LEFT 06/13/2008  . PROSTATE SPECIFIC ANTIGEN, ELEVATED, CHRONIC 06/15/2010  . History of prostate cancer 2001--  MILD FORM--  NO TX---  FOLLOWED BY DR [facility]  . BPH (benign prostatic hypertrophy)   . Carotid stenosis, asymptomatic BILATERAL MILD ICA STENOSIS   < 50%    PER CAROTID DUPLEX  09-21-2010  . Arthritis   . Colon polyp   . Irritable bowel syndrome   . Prostate cancer metastatic to bone [facility])     Past Surgical History  Procedure Laterality Date  . Total knee arthroplasty  02-11-2006     left knee  oa  . Transurethral resection of prostate  07-29-2000    bph w/ boo  . Lumbar laminotomy and foraminotomy bilaterally  05-12-2007    L2 - 3 ;  SPONDYLOSIS W/ STENOSIS  . Cardiovascular stress test  09-20-2010  DR BENSIMHON    NORMAL NUCLEAR STUDY/ NO ISCHEMIA/ EF 68%  . Prostate biopsy  07/16/2012    Procedure: PROSTATE BIOPSY;  Surgeon: Malka So, MD;  Location: [facility];  Service:  Urology;  Laterality: N/A;  WITH ULTRASOUND ([facility] ULTRA SOUND TECH COMING) OWER  . Transurethral resection of prostate  07/16/2012    Procedure: TRANSURETHRAL RESECTION OF THE PROSTATE (TURP);  Surgeon: Malka So, MD;  Location: [facility];  Service: Urology;;  with gyrus  . Craniotomy Right 11/28/2015    Procedure: CRANIOTOMY HEMATOMA EVACUATION SUBDURAL;  Surgeon: Earnie Larsson, MD;  Location: [place] [facility];  Service: Neurosurgery;  Laterality: Right;  CRANIOTOMY HEMATOMA EVACUATION SUBDURAL    VITAL SIGNS BP 132/64 mmHg  Pulse 75  Temp(Src) 97.6 F (36.4 C)  Resp 20  SpO2 94%  Patient's Medications  New Prescriptions   No medications on file  Previous Medications   CALCIUM-VITAMIN D (OSCAL WITH D) 500-200 MG-UNIT TABLET    Take 1 tablet by mouth daily with breakfast.   CARISOPRODOL (SOMA) 350 MG TABLET    Take 0.5-(175mg ) by mouth daily as needed.   CELECOXIB (CELEBREX) 200 MG CAPSULE    Take 1 capsule (200 mg total) by mouth daily.   CLONIDINE (CATAPRES) 0.1 MG TABLET    Take 0.1 mg by mouth every 8 (eight) hours as needed. For systolic blood pressure 99991111   DIAZEPAM (VALIUM) 5 MG TABLET    Take 1 tablet (5 mg total) by  mouth every 12 (twelve) hours as needed for anxiety.   DONEPEZIL (ARICEPT) 5 MG TABLET    Take 1 tablet (5 mg total) by mouth at bedtime.   FOOD THICKENER (THICK IT) POWD    Take 1 Container by mouth as needed. For HONEY thick   HYDROXYZINE (ATARAX/VISTARIL) 10 MG TABLET    Take 10 mg by mouth 3 (three) times daily as needed for itching. Take two daily   OMEPRAZOLE (PRILOSEC) 20 MG CAPSULE    Take 20 mg by mouth daily.  Modified Medications   No medications on file  Discontinued Medications     SIGNIFICANT DIAGNOSTIC EXAMS  LABS REVIEWED:   12-08-15: PSA 3.30; testosterone 31.02 12-13-15: wbc 8.5; hgb 9.7; hct 30.4; mcv 89.1; plt 355; glucose 89; bun 22; creat 0.86; k+ 3.6; na++139; liver normal albumin 3.41   Review of Systems    Unable to perform ROS: dementia     Physical Exam  Constitutional: No distress.  Thin  Eyes: Conjunctivae are normal.  Neck: Neck supple. No JVD present. No thyromegaly present.  Cardiovascular: Normal rate, regular rhythm and intact distal pulses.   Respiratory: Effort normal and breath sounds normal. No respiratory distress. He has no wheezes.  GI: Soft. Bowel sounds are normal. He exhibits no distension. There is no tenderness.  Musculoskeletal: He exhibits no edema.  Able to move all extremities   Lymphadenopathy:    He has no cervical adenopathy.  Neurological: He is alert.  Skin: Skin is warm and dry. He is not diaphoretic.  Psychiatric: He has a normal mood and affect.      ASSESSMENT/ PLAN:  1. Epidural hematoma: status post CRANIOTOMY HEMATOMA EVACUATION on 11-28-15: will continue therapy as indicated; will continue to monitor his status.   2. Dysphagia: no signs of aspiration present; will continue honey thick liquids  3. Osteoarthritis of spine: will continue soma 175 mg daily as needed and will continue celebrex 200 mg daily   4. Gerd: will continue prilosec 20 mg daily   5. Hypertension: will continue clonidine 0.1 mg every 8 hours as needed for Sb/p >190 will monitor  6. Memory loss: will continue aricept 5 mg nightly   7. Episodic mood disorder: will continue valium 5 mg twice daily as needed  8. Prostate cancer: is followed by urology; did not require lupron injection;   9. BPH: his ditropan has been stopped; will monitor his status.   10. Diplopia: will have him to be seen by eye doctor and will monitor his status.   Ok Edwards NP [facility]  Contact [phone] Monday through Friday 8am- 5pm  After hours call [phone]

## 2016-01-03 ENCOUNTER — Non-Acute Institutional Stay (SKILLED_NURSING_FACILITY): Payer: Medicare Other | Admitting: Nurse Practitioner

## 2016-01-03 DIAGNOSIS — I6200 Nontraumatic subdural hemorrhage, unspecified: Secondary | ICD-10-CM

## 2016-01-03 DIAGNOSIS — R1314 Dysphagia, pharyngoesophageal phase: Secondary | ICD-10-CM

## 2016-01-03 DIAGNOSIS — R413 Other amnesia: Secondary | ICD-10-CM

## 2016-01-03 DIAGNOSIS — S065X9A Traumatic subdural hemorrhage with loss of consciousness of unspecified duration, initial encounter: Secondary | ICD-10-CM

## 2016-01-03 DIAGNOSIS — C61 Malignant neoplasm of prostate: Secondary | ICD-10-CM | POA: Diagnosis not present

## 2016-01-03 DIAGNOSIS — K219 Gastro-esophageal reflux disease without esophagitis: Secondary | ICD-10-CM

## 2016-01-03 DIAGNOSIS — M47896 Other spondylosis, lumbar region: Secondary | ICD-10-CM | POA: Diagnosis not present

## 2016-01-03 DIAGNOSIS — I10 Essential (primary) hypertension: Secondary | ICD-10-CM

## 2016-01-03 DIAGNOSIS — S065XAA Traumatic subdural hemorrhage with loss of consciousness status unknown, initial encounter: Secondary | ICD-10-CM

## 2016-01-03 MED ORDER — CARISOPRODOL 350 MG PO TABS: 175.0000 mg | ORAL_TABLET | Freq: Every evening | ORAL | Status: AC | PRN

## 2016-01-03 NOTE — Progress Notes (Signed)
Patient ID: KESTIN CONATSER, male   DOB: 1922-07-23, 82 y.o.   MRN: [id number]    Nursing Home Location:  [place] of Service: SNF (31)  PCP: Hoyt Koch, MD  No Known Allergies  Chief Complaint  Patient presents with  . Discharge Note    HPI:  Patient is a 82 y.o. male seen today at [facility] for discharge home with 24 hours care. Pt with a pmh of BPH, arthritis, GERD, memory loss.  Pt is at [facility] after hospitalization from 11/27/15-12/06/15 with epidural hematoma. He underwent right sided craniotomy and evacuation of hematoma. He had UTI post operatively and was started on antibiotics. Currently without dysuria, abdominal pain. No changes in mentation. Patient currently doing well with therapy, now stable to discharge home with home health. Review of Systems:  Review of Systems  Constitutional: Negative for activity change, appetite change, fatigue and unexpected weight change.  HENT: Negative for congestion and hearing loss.   Eyes: Negative.   Respiratory: Negative for cough and shortness of breath.   Cardiovascular: Negative for chest pain, palpitations and leg swelling.  Gastrointestinal: Negative for abdominal pain, diarrhea and constipation.  Genitourinary: Negative for dysuria and difficulty urinating.  Musculoskeletal: Negative for myalgias and arthralgias.  Skin: Negative for color change.  Neurological: Negative for dizziness and weakness.  Psychiatric/Behavioral: Positive for confusion. Negative for behavioral problems and agitation.    Past Medical History  Diagnosis Date  . OTH MALIG NEOPLASM SKIN OTH\T\UNSPEC PARTS FACE 02/10/2009  . HYPERLIPIDEMIA 08/19/2007  . 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 ACTINIC KERATOSIS, EAR, LEFT 06/13/2008  . PROSTATE SPECIFIC ANTIGEN, ELEVATED, CHRONIC 06/15/2010  . History of prostate cancer 2001--  MILD FORM--  NO TX---  FOLLOWED BY DR  [facility]  . BPH (benign prostatic hypertrophy)   . Carotid stenosis, asymptomatic BILATERAL MILD ICA STENOSIS   < 50%    PER CAROTID DUPLEX  09-21-2010  . Arthritis   . Colon polyp   . Irritable bowel syndrome   . Prostate cancer metastatic to bone [facility])    Past Surgical History  Procedure Laterality Date  . Total knee arthroplasty  02-11-2006     left knee  oa  . Transurethral resection of prostate  07-29-2000    bph w/ boo  . Lumbar laminotomy and foraminotomy bilaterally  05-12-2007    L2 - 3 ;  SPONDYLOSIS W/ STENOSIS  . Cardiovascular stress test  09-20-2010  DR BENSIMHON    NORMAL NUCLEAR STUDY/ NO ISCHEMIA/ EF 68%  . Prostate biopsy  07/16/2012    Procedure: PROSTATE BIOPSY;  Surgeon: Malka So, MD;  Location: [facility];  Service: Urology;  Laterality: N/A;  WITH ULTRASOUND ([facility] ULTRA SOUND TECH COMING) OWER  . Transurethral resection of prostate  07/16/2012    Procedure: TRANSURETHRAL RESECTION OF THE PROSTATE (TURP);  Surgeon: Malka So, MD;  Location: [facility];  Service: Urology;;  with gyrus  . Craniotomy Right 11/28/2015    Procedure: CRANIOTOMY HEMATOMA EVACUATION SUBDURAL;  Surgeon: Earnie Larsson, MD;  Location: [place] NEUR[facility] OR[facility];  Service: Neurosurgery;  Laterality: Right;  CRANIOTOMY HEMATOMA EVACUATION SUBDURAL   Social History:   reports that he has never smoked. He has never used smokeless tobacco. He reports that he does not drink alcohol or use illicit drugs.  Family History  Problem Relation Age of Onset  . Arthritis  Mother   . Cancer Father   . Prostate cancer Father   . Colon cancer Mother     Died at age 76  . Colon polyps Neg Hx     Medications: Patient's Medications  New Prescriptions   No medications on file  Previous Medications   CALCIUM-VITAMIN D (OSCAL WITH D) 500-200 MG-UNIT TABLET    Take 1 tablet by mouth daily with breakfast.   CELECOXIB (CELEBREX) 200 MG CAPSULE    Take 1 capsule (200 mg total)  by mouth daily.   CLONIDINE (CATAPRES) 0.1 MG TABLET    Take 0.1 mg by mouth every 8 (eight) hours as needed. For systolic blood pressure 99991111   DIAZEPAM (VALIUM) 5 MG TABLET    Take 1 tablet (5 mg total) by mouth every 12 (twelve) hours as needed for anxiety.   DONEPEZIL (ARICEPT) 5 MG TABLET    Take 1 tablet (5 mg total) by mouth at bedtime.   FOOD THICKENER (THICK IT) POWD    Take 1 Container by mouth as needed. For HONEY thick   HYDROXYZINE (ATARAX/VISTARIL) 10 MG TABLET    Take 10 mg by mouth 3 (three) times daily as needed for itching. Take two daily   OMEPRAZOLE (PRILOSEC) 20 MG CAPSULE    Take 20 mg by mouth daily.  Modified Medications   Modified Medication Previous Medication   CARISOPRODOL (SOMA) 350 MG TABLET carisoprodol (SOMA) 350 MG tablet      Take 0.5 tablets (175 mg total) by mouth at bedtime as needed for muscle spasms.    Take 0.5-1 tablets (175-350 mg total) by mouth daily as needed.  Discontinued Medications   No medications on file     Physical Exam: Filed Vitals:   01/03/16 1356  BP: 117/72  Pulse: 78  Temp: 98.8 F (37.1 C)  Resp: 16    Physical Exam  Constitutional: He appears well-developed and well-nourished. No distress.  HENT:  Head: Normocephalic and atraumatic.  Mouth/Throat: Oropharynx is clear and moist. No oropharyngeal exudate.  Eyes: Conjunctivae and EOM are normal. Pupils are equal, round, and reactive to light.  Neck: Normal range of motion. Neck supple.  Cardiovascular: Normal rate, regular rhythm and normal heart sounds.   Pulmonary/Chest: Effort normal and breath sounds normal.  Abdominal: Soft. Bowel sounds are normal.  Musculoskeletal: He exhibits no edema or tenderness.  Neurological: He is alert.  Memory loss  Skin: Skin is warm and dry. He is not diaphoretic.  Psychiatric: He has a normal mood and affect. Cognition and memory are impaired. He exhibits abnormal recent memory.    Labs reviewed: Basic Metabolic Panel:  Recent  Labs  10/30/15 1618 11/27/15 2009 11/27/15 2015 11/29/15 0425  NA 132* 127* 129* 129*  K 3.8 4.2 4.2 4.6  CL 98 91* 92* 99*  CO2 25 26  --  24  GLUCOSE 106* 115* 113* 119*  BUN 20 22* 25* 18  CREATININE 0.90 1.07 1.10 0.85  CALCIUM 9.5 9.9  --  8.8*   Liver Function Tests:  Recent Labs  10/30/15 1618 11/27/15 2009 11/29/15 0425  AST 22 31 36  ALT 15 19 14*  ALKPHOS 77 88 61  BILITOT 0.4 0.7 1.1  PROT 7.0 6.9 5.9*  ALBUMIN 4.2 4.4 3.5   No results for input(s): LIPASE, AMYLASE in the last 8760 hours. No results for input(s): AMMONIA in the last 8760 hours. CBC:  Recent Labs  11/27/15 2009 11/27/15 2015 11/29/15 0425 12/04/15 1653  WBC 9.6  --  12.5* 12.5*  NEUTROABS 7.5  --   --  9.5*  HGB 13.4 14.3 10.8* 10.2*  HCT 38.7* 42.0 31.7* 30.4*  MCV 85.6  --  85.7 86.6  PLT 182  --  174 261   TSH:  Recent Labs  10/30/15 1618  TSH 1.16   A1C: Lab Results  Component Value Date   HGBA1C 5.7* 11/27/2015   Lipid Panel:  Recent Labs  11/28/15 0030  CHOL 227*  HDL 58  LDLCALC 149*  TRIG 101  CHOLHDL 3.9    Assessment/Plan 1. subdural hematoma (HCC) S/p craniotomy hematoma evacuation on 11/28/15. Has done well with thearpy. Will be discharging with 24 hour care.   2. Other osteoarthritis of spine, lumbar region Stable, no complaints of pain, conts on celebrex 200 mg daily   3. Gastroesophageal reflux disease without esophagitis Denies GERD, conts on omeprazole 20 mg daily   4. Memory loss Stable, conts on aricept 5 mg daily  5. Essential hypertension Stable, not requiring daily medication, clonidine 0.1 as needed for sbp >190 ordered while at [facility] however blood pressures at goal, will DC clonidine on discharge.   6. Dysphagia, pharyngoesophageal phase Stable, without signs of aspiration, followed by ST here, will order as outpt to follow up. Cont honey thick  7. Malignant neoplasm of prostate ([place]) Hx of prostate cancer, recent PSA of  3.3 on 12/08/15. Following with urology.   pt is stable for discharge-will need PT/OT/ST per home health. DME needed includes standard WC with elevating swing away leg rest, WC cushion. Rx written.  will need to follow up with PCP within 2 weeks.     Carlos American. Harle Battiest  [facility] [phone] 8 am - 5 pm) [phone] (after hours)

## 2016-01-11 ENCOUNTER — Telehealth: Payer: Self-pay | Admitting: Internal Medicine

## 2016-01-11 ENCOUNTER — Encounter: Payer: Self-pay | Admitting: Internal Medicine

## 2016-01-11 ENCOUNTER — Ambulatory Visit (INDEPENDENT_AMBULATORY_CARE_PROVIDER_SITE_OTHER): Payer: Medicare Other | Admitting: Internal Medicine

## 2016-01-11 VITALS — BP 130/82 | HR 88 | Temp 98.0°F

## 2016-01-11 DIAGNOSIS — F329 Major depressive disorder, single episode, unspecified: Secondary | ICD-10-CM | POA: Diagnosis not present

## 2016-01-11 DIAGNOSIS — R296 Repeated falls: Secondary | ICD-10-CM

## 2016-01-11 DIAGNOSIS — R5383 Other fatigue: Secondary | ICD-10-CM | POA: Diagnosis not present

## 2016-01-11 DIAGNOSIS — F32A Depression, unspecified: Secondary | ICD-10-CM | POA: Insufficient documentation

## 2016-01-11 DIAGNOSIS — I10 Essential (primary) hypertension: Secondary | ICD-10-CM | POA: Diagnosis not present

## 2016-01-11 LAB — POCT URINALYSIS DIPSTICK
Bilirubin, UA: NEGATIVE
Glucose, UA: NEGATIVE
KETONES UA: NEGATIVE
Leukocytes, UA: NEGATIVE
Nitrite, UA: NEGATIVE
PH UA: 6.5
PROTEIN UA: 1
RBC UA: NEGATIVE
SPEC GRAV UA: 1.025
UROBILINOGEN UA: NEGATIVE

## 2016-01-11 MED ORDER — MIRTAZAPINE 30 MG PO TABS: 30.0000 mg | ORAL_TABLET | Freq: Every day | ORAL | Status: AC

## 2016-01-11 NOTE — Telephone Encounter (Signed)
Fine with me

## 2016-01-11 NOTE — Progress Notes (Signed)
Subjective:    Patient ID: [name], male    DOB: Nov 16, 1922, 82 y.o.   MRN: [id number]  HPI  Her to f/u, c/o fatigue, more difficult sleeping, more stress recently, wife wants separation.  Mor e anxious and depressed recently.  Not taking any meds. Pt denies chest pain, increased sob or doe, wheezing, orthopnea, PND, increased LE swelling, palpitations, dizziness or syncope.  Pt denies new neurological symptoms such as new headache, or facial or extremity weakness or numbness   Pt denies polydipsia, polyuria.  Wondering if dehydrated b/c only gets thickened liquids, has been drinking more fluids toda, can cough somewhat with liquids sometimes.  Has likely lost some wt - not yet had wt checked today. Lats wt documented likely an error.  No recent bleeding, last hgb low at 9.7 post op. Wt Readings from Last 3 Encounters:  12/02/15 190 lb 14.7 oz (86.6 kg)  11/14/15 146 lb (66.225 kg)  10/30/15 146 lb (66.225 kg)   Lab Results  Component Value Date   WBC 8.5 12/13/2015   HGB 9.7* 12/13/2015   HCT 30* 12/13/2015   MCV 86.6 12/04/2015   PLT 355 12/13/2015   Past Medical History  Diagnosis Date  . OTH MALIG NEOPLASM SKIN OTH\T\UNSPEC PARTS FACE 02/10/2009  . HYPERLIPIDEMIA 08/19/2007  . 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 ACTINIC KERATOSIS, EAR, LEFT 06/13/2008  . PROSTATE SPECIFIC ANTIGEN, ELEVATED, CHRONIC 06/15/2010  . History of prostate cancer 2001--  MILD FORM--  NO TX---  FOLLOWED BY DR [facility]  . BPH (benign prostatic hypertrophy)   . Carotid stenosis, asymptomatic BILATERAL MILD ICA STENOSIS   < 50%    PER CAROTID DUPLEX  09-21-2010  . Arthritis   . Colon polyp   . Irritable bowel syndrome   . Prostate cancer metastatic to bone [facility])    Past Surgical History  Procedure Laterality Date  . Total knee arthroplasty  02-11-2006     left knee  oa  . Transurethral resection of prostate  07-29-2000    bph w/ boo  . Lumbar laminotomy and  foraminotomy bilaterally  05-12-2007    L2 - 3 ;  SPONDYLOSIS W/ STENOSIS  . Cardiovascular stress test  09-20-2010  DR BENSIMHON    NORMAL NUCLEAR STUDY/ NO ISCHEMIA/ EF 68%  . Prostate biopsy  07/16/2012    Procedure: PROSTATE BIOPSY;  Surgeon: Malka So, MD;  Location: [facility];  Service: Urology;  Laterality: N/A;  WITH ULTRASOUND ([facility] ULTRA SOUND TECH COMING) OWER  . Transurethral resection of prostate  07/16/2012    Procedure: TRANSURETHRAL RESECTION OF THE PROSTATE (TURP);  Surgeon: Malka So, MD;  Location: [facility];  Service: Urology;;  with gyrus  . Craniotomy Right 11/28/2015    Procedure: CRANIOTOMY HEMATOMA EVACUATION SUBDURAL;  Surgeon: Earnie Larsson, MD;  Location: [place] NEURO ORS;  Service: Neurosurgery;  Laterality: Right;  CRANIOTOMY HEMATOMA EVACUATION SUBDURAL    reports that he has never smoked. He has never used smokeless tobacco. He reports that he does not drink alcohol or use illicit drugs. family history includes Arthritis in his mother; Cancer in his father; Colon cancer in his mother; Prostate cancer in his father. There is no history of Colon polyps. No Known Allergies Current Outpatient Prescriptions on File Prior to Visit  Medication Sig Dispense Refill  . calcium-vitamin D (OSCAL WITH D) 500-200 MG-UNIT tablet Take 1 tablet by mouth daily with breakfast.    .  carisoprodol (SOMA) 350 MG tablet Take 0.5 tablets (175 mg total) by mouth at bedtime as needed for muscle spasms.  0  . celecoxib (CELEBREX) 200 MG capsule Take 1 capsule (200 mg total) by mouth daily. 90 capsule 3  . diazepam (VALIUM) 5 MG tablet Take 1 tablet (5 mg total) by mouth every 12 (twelve) hours as needed for anxiety. 60 tablet 1  . food thickener (THICK IT) POWD Take 1 Container by mouth as needed. For HONEY thick    . omeprazole (PRILOSEC) 20 MG capsule Take 20 mg by mouth daily.    Marland Kitchen donepezil (ARICEPT) 5 MG tablet Take 1 tablet (5 mg total) by  mouth at bedtime. (Patient not taking: Reported on 01/11/2016) 90 tablet 1  . hydrOXYzine (ATARAX/VISTARIL) 10 MG tablet Take 10 mg by mouth 3 (three) times daily as needed for itching. Reported on 01/11/2016     No current facility-administered medications on file prior to visit.   Review of Systems  Constitutional: Negative for unusual diaphoresis or night sweats HENT: Negative for ringing in ear or discharge Eyes: Negative for double vision or worsening visual disturbance.  Respiratory: Negative for choking and stridor.   Gastrointestinal: Negative for vomiting or other signifcant bowel change Genitourinary: Negative for hematuria or change in urine volume.  Musculoskeletal: Negative for other MSK pain or swelling Skin: Negative for color change and worsening wound.  Neurological: Negative for tremors and numbness other than noted  Psychiatric/Behavioral: Negative for decreased concentration or agitation other than above       Objective:   Physical Exam BP 130/82 mmHg  Pulse 88  Temp(Src) 98 F (36.7 C) (Oral)  SpO2 98% VS noted, not ill appearing but weak, seated in wheelchair Constitutional: Pt appears in no significant distress HENT: Head: NCAT.  Right Ear: External ear normal.  Left Ear: External ear normal.  Eyes: . Pupils are equal, round, and reactive to light. Conjunctivae and EOM are normal Neck: Normal range of motion. Neck supple.  Cardiovascular: Normal rate and regular rhythm.   Pulmonary/Chest: Effort normal and breath sounds without rales or wheezing.  Abd:  Soft, NT, ND, + BS Neurological: Pt is alert. + mild confused , motor grossly intact,  Skin: Skin is warm. No rash, no LE edema Psychiatric: Pt behavior is normal. No agitation. + depressed affect    Assessment & Plan:

## 2016-01-11 NOTE — Assessment & Plan Note (Signed)
None for 2 wks, but son is asking for PT - ok (signed order to be done at his facility)

## 2016-01-11 NOTE — Progress Notes (Signed)
Pre visit review using our clinic review tool, if applicable. No additional management support is needed unless otherwise documented below in the visit note. 

## 2016-01-11 NOTE — Patient Instructions (Signed)
Please take all new medication as prescribed- the anti-depressant  Please continue all other medications as before, and refills have been done if requested.  Please have the pharmacy call with any other refills you may need.  Please keep your appointments with your specialists as you may have planned  Please go to the LAB in the Basement (turn left off the elevator) for the tests to be done today  You will be contacted by phone if any changes need to be made immediately.  Otherwise, you will receive a letter about your results with an explanation, but please check with MyChart first.  .Please remember to sign up for MyChart if you have not done so, as this will be important to you in the future with finding out test results, communicating by private email, and scheduling acute appointments online when needed.  Please return in 6 months, or sooner if needed, with Dr Sharlet Salina (your PCP)

## 2016-01-11 NOTE — Assessment & Plan Note (Signed)
With sleep difficutly and wt loss, to start remeron 30 qhs, f.u PCP 3 wks

## 2016-01-11 NOTE — Telephone Encounter (Signed)
Recently discharged from [facility].  Needs updated order for PT, OT and ST.  Can give verbal and leave message if no answer.

## 2016-01-11 NOTE — Assessment & Plan Note (Signed)
stable overall by history and exam, recent data reviewed with pt, and pt to continue medical treatment as before,  to f/u any worsening symptoms or concerns BP Readings from Last 3 Encounters:  01/11/16 130/82  01/03/16 117/72  12/22/15 132/64

## 2016-01-11 NOTE — Assessment & Plan Note (Addendum)
UA dip neg, exam benign, I supset related to sleep and depression but cant r/o anemia or other - also forl labs today as ordered  Note:  Total time for pt hx, exam, review of record with pt in the room, determination of diagnoses and plan for further eval and tx is > 40 min, with over 50% spent in coordination and counseling of patient

## 2016-01-12 ENCOUNTER — Other Ambulatory Visit (INDEPENDENT_AMBULATORY_CARE_PROVIDER_SITE_OTHER): Payer: Medicare Other

## 2016-01-12 ENCOUNTER — Telehealth: Payer: Self-pay | Admitting: Internal Medicine

## 2016-01-12 DIAGNOSIS — R5383 Other fatigue: Secondary | ICD-10-CM

## 2016-01-12 LAB — CBC WITH DIFFERENTIAL/PLATELET
BASOS PCT: 0.6 % (ref 0.0–3.0)
Basophils Absolute: 0 10*3/uL (ref 0.0–0.1)
EOS ABS: 0 10*3/uL (ref 0.0–0.7)
EOS PCT: 0.5 % (ref 0.0–5.0)
HEMATOCRIT: 39.2 % (ref 39.0–52.0)
HEMOGLOBIN: 12.9 g/dL — AB (ref 13.0–17.0)
LYMPHS PCT: 22.9 % (ref 12.0–46.0)
Lymphs Abs: 2 10*3/uL (ref 0.7–4.0)
MCHC: 33 g/dL (ref 30.0–36.0)
MCV: 87.4 fl (ref 78.0–100.0)
MONO ABS: 0.7 10*3/uL (ref 0.1–1.0)
Monocytes Relative: 8.4 % (ref 3.0–12.0)
Neutro Abs: 5.9 10*3/uL (ref 1.4–7.7)
Neutrophils Relative %: 67.6 % (ref 43.0–77.0)
Platelets: 254 10*3/uL (ref 150.0–400.0)
RBC: 4.49 Mil/uL (ref 4.22–5.81)
RDW: 14.7 % (ref 11.5–15.5)
WBC: 8.7 10*3/uL (ref 4.0–10.5)

## 2016-01-12 LAB — TSH: TSH: 1.87 u[IU]/mL (ref 0.35–4.50)

## 2016-01-12 LAB — HEPATIC FUNCTION PANEL
ALBUMIN: 4.2 g/dL (ref 3.5–5.2)
ALT: 9 U/L (ref 0–53)
AST: 15 U/L (ref 0–37)
Alkaline Phosphatase: 81 U/L (ref 39–117)
BILIRUBIN TOTAL: 0.5 mg/dL (ref 0.2–1.2)
Bilirubin, Direct: 0.1 mg/dL (ref 0.0–0.3)
Total Protein: 7.4 g/dL (ref 6.0–8.3)

## 2016-01-12 LAB — BASIC METABOLIC PANEL
BUN: 18 mg/dL (ref 6–23)
CALCIUM: 10.2 mg/dL (ref 8.4–10.5)
CO2: 30 meq/L (ref 19–32)
CREATININE: 0.89 mg/dL (ref 0.40–1.50)
Chloride: 101 mEq/L (ref 96–112)
GFR: 85.05 mL/min (ref >60.00)
Glucose, Bld: 103 mg/dL — ABNORMAL HIGH (ref 70–99)
Potassium: 4 mEq/L (ref 3.5–5.1)
SODIUM: 138 meq/L (ref 135–145)

## 2016-01-12 NOTE — Telephone Encounter (Signed)
Called and spoke with Amy and gave verbal orders.

## 2016-01-12 NOTE — Telephone Encounter (Signed)
[employer] fax number [phone]  Nanine Means needs this info faxed over to them on this pt Problem list  Med list Last Dr visit note And A1c

## 2016-01-15 ENCOUNTER — Telehealth: Payer: Self-pay | Admitting: Internal Medicine

## 2016-01-15 NOTE — Telephone Encounter (Signed)
Spoke with Amy and gave verbal orders. 

## 2016-01-15 NOTE — Telephone Encounter (Signed)
Faxed information to [facility]

## 2016-01-15 NOTE — Telephone Encounter (Signed)
Called to give verbal orders. The mailbox was full. I was not able to leave a message.

## 2016-01-15 NOTE — Telephone Encounter (Signed)
OT order -  2 times a week for 4 weeks.  Is requesting verbal.  Can leave message on VM.

## 2016-01-15 NOTE — Telephone Encounter (Signed)
Fine with me

## 2016-01-15 NOTE — Telephone Encounter (Signed)
[name] called back and I let her know Dr. Sharlet Salina ok'd

## 2016-01-15 NOTE — Telephone Encounter (Signed)
Suanne Marker called from [facility] request social evaluation and PT: 1 time a week for 1 week, 3 times a week for 2 week and 2 times a week for 3 week. Please call her back  Phone # [phone]

## 2016-01-15 NOTE — Telephone Encounter (Signed)
Patient is complaining about constipation and is requesting enema.  Is requesting order in regards.  Please call Courtney at [phone]

## 2016-01-16 NOTE — Telephone Encounter (Signed)
Is he at a facility? If not then can get and do at home. Otherwise would recommend magnesium citrate BID until bowel movement.

## 2016-01-16 NOTE — Telephone Encounter (Signed)
Left message on voice mail informing Loma Sousa of Dr. Nathanial Millman instructions.

## 2016-01-17 ENCOUNTER — Telehealth: Payer: Self-pay | Admitting: Internal Medicine

## 2016-01-17 NOTE — Telephone Encounter (Signed)
Has completed assessment. Is requesting order for plan of care for 2 x a week for 1 week and 3 x a week for 2 weeks for pharyngoesophageal dysphasia.

## 2016-01-18 NOTE — Telephone Encounter (Signed)
Fine

## 2016-01-18 NOTE — Telephone Encounter (Signed)
Called and gave Stacy verbal ok  

## 2016-01-19 ENCOUNTER — Encounter: Payer: Self-pay | Admitting: Internal Medicine

## 2016-01-19 ENCOUNTER — Telehealth: Payer: Self-pay | Admitting: Internal Medicine

## 2016-01-19 ENCOUNTER — Ambulatory Visit (INDEPENDENT_AMBULATORY_CARE_PROVIDER_SITE_OTHER): Payer: Medicare Other | Admitting: Internal Medicine

## 2016-01-19 VITALS — BP 122/74 | HR 93 | Temp 98.2°F | Resp 16 | Ht 62.0 in | Wt 127.0 lb

## 2016-01-19 DIAGNOSIS — R413 Other amnesia: Secondary | ICD-10-CM | POA: Diagnosis not present

## 2016-01-19 DIAGNOSIS — Z23 Encounter for immunization: Secondary | ICD-10-CM | POA: Diagnosis not present

## 2016-01-19 MED ORDER — MECLIZINE HCL 25 MG PO TABS: 25.0000 mg | ORAL_TABLET | Freq: Three times a day (TID) | ORAL | Status: AC | PRN

## 2016-01-19 NOTE — Progress Notes (Signed)
Pre visit review using our clinic review tool, if applicable. No additional management support is needed unless otherwise documented below in the visit note. 

## 2016-01-19 NOTE — Telephone Encounter (Signed)
Amy is calling to advise that they missed the patient's PT this week. Patient was not feeling well on Wednesday.

## 2016-01-19 NOTE — Patient Instructions (Signed)
We have filled out the papers and sent in the meclizine.   Good luck with [place], if you need anything before you get established there please feel free to call us.

## 2016-01-21 NOTE — Progress Notes (Signed)
   Subjective:    Patient ID: [name], male    DOB: 02/25/1922, 82 y.o.   MRN: [id number]  HPI The patient is a 82 YO man coming in for visit after SNF. He would like to stop the aricept as he does not feel it is helping. He is currently separating from his wife and moving to [place] with his son (to an ALF there). He needs several forms filled out so that he can go. No new concerns.   Review of Systems  Constitutional: Negative for fever, activity change, appetite change and unexpected weight change.  Respiratory: Negative.   Cardiovascular: Negative.   Gastrointestinal: Negative.   Musculoskeletal: Positive for back pain. Negative for myalgias.  Skin: Negative.   Neurological: Negative.   Psychiatric/Behavioral: Positive for decreased concentration. Negative for sleep disturbance. The patient is not nervous/anxious.       Objective:   Physical Exam  Constitutional: He appears well-developed and well-nourished.  HENT:  Head: Normocephalic and atraumatic.  Eyes: EOM are normal.  Neck: Normal range of motion.  Cardiovascular: Normal rate and regular rhythm.   Pulmonary/Chest: Effort normal. No respiratory distress. He has no wheezes.  Abdominal: Soft. He exhibits no distension. There is no tenderness. There is no rebound.  Musculoskeletal: He exhibits no edema.  Neurological: He is alert.  Skin: Skin is warm and dry.   Filed Vitals:   01/19/16 1127  BP: 122/74  Pulse: 93  Temp: 98.2 F (36.8 C)  TempSrc: Oral  Resp: 16  Height: 5\' 2"  (1.575 m)  Weight: 127 lb (57.607 kg)  SpO2: 99%      Assessment & Plan:  Tdap given at visit.

## 2016-01-21 NOTE — Assessment & Plan Note (Signed)
Does not feel difference with aricept and wishes to stop. Okay with that and reminded him of the effects of aricept on not improving but stabilizing the memory. He wishes to readdress this in  [place] if he notices any difference. Feels that his wife caused him a lot of stress which he feels limited his memory skills.

## 2016-01-22 ENCOUNTER — Telehealth: Payer: Self-pay | Admitting: Internal Medicine

## 2016-01-22 NOTE — Telephone Encounter (Signed)
Patient is going to be discharged.  Patient moved to [place] on Saturday.

## 2016-01-22 NOTE — Telephone Encounter (Signed)
Fine

## 2016-01-30 ENCOUNTER — Telehealth: Payer: Self-pay

## 2016-01-30 NOTE — Telephone Encounter (Signed)
Home Health Cert/Plan of Care received, completed, and placed on MD's desk for signature

## 2016-01-30 NOTE — Telephone Encounter (Signed)
Paperwork signed, faxed, copy sent to scan 

## 2016-02-05 ENCOUNTER — Telehealth: Payer: Self-pay | Admitting: Internal Medicine

## 2016-02-05 NOTE — Telephone Encounter (Signed)
States there was a face to face faxed over today but there was no OV notes included for 01/21/2016.  Please fax to [phone].

## 2016-02-06 NOTE — Telephone Encounter (Signed)
Faxed notes

## 2016-02-06 NOTE — Telephone Encounter (Signed)
Has checked fax and does not have.  Can you please refax to [phone].

## 2016-02-07 NOTE — Telephone Encounter (Signed)
Megine has called back regarding the face to face. She received the office notes but there are still some things missing from the form like diagnosis code and why pt is receiving their services.  Can you please give her a call at [phone] to be sure she gets what she needs.

## 2016-02-07 NOTE — Telephone Encounter (Signed)
Re-faxed office notes.

## 2016-02-09 NOTE — Telephone Encounter (Signed)
Spoke to [employer]. I am faxing over office notes from 01/11/16. She will send over face to face to be signed again. Fax to [phone].

## 2016-02-19 NOTE — Telephone Encounter (Signed)
[name] is calling back to advise that addendum faxed over shows that the signature and visit note dates do not match. The encounter date reflects 01/21/2016, and does not match the visit notes that show 01/19/2016.   Fax to [phone]

## 2016-02-26 ENCOUNTER — Telehealth: Payer: Self-pay | Admitting: Geriatric Medicine

## 2016-02-26 NOTE — Telephone Encounter (Signed)
Patient is requesting a statement from you stating that he was unable to travel by air around Christmas of last year because he was in a transitional care facility recovering from a subdural hematoma and brain surgery. Dr. Amado Coe was the surgeon. [employer] will refund the ticket price if the patient has a letter from his PCP. Thanks.

## 2016-02-26 NOTE — Telephone Encounter (Signed)
I have faxed the only notes I have.

## 2016-02-26 NOTE — Telephone Encounter (Signed)
Printed and signed.  

## 2016-02-26 NOTE — Telephone Encounter (Signed)
Patient aware and asked that I mail it to him.

## 2016-03-07 ENCOUNTER — Telehealth: Payer: Self-pay | Admitting: Internal Medicine

## 2016-03-07 NOTE — Telephone Encounter (Signed)
States OV notes sent over is dated for 1/20 and face to face is dated 1/22.  States dates needs to be the same.  Also states that the [facility] notes faxed over do not include any of the diagnosis included on the face to face.  States everything on Face to Face need to be documented as discussed on OV notes.

## 2016-03-11 NOTE — Telephone Encounter (Signed)
They were supposed to be faxing over a copy of the face to face and Dr. Sharlet Salina was going to fix the date. The face to face was never received.

## 2016-03-18 NOTE — Telephone Encounter (Signed)
States that OV notes along with face to face does not match up with what is on the face to face.  Needs OV notes that correlate with face to face. States this has been outstanding for over 60 days.

## 2016-03-18 NOTE — Telephone Encounter (Signed)
I have already signed the face to face.

## 2016-03-19 NOTE — Telephone Encounter (Signed)
Face to Face was signed and faxed weeks ago. I have sent it twice. I don't understand what the problem has been. The original one had the wrong date. Dr. Sharlet Salina signed the second one with the correct date and that has been sent as well.

## 2016-04-22 NOTE — Telephone Encounter (Signed)
Perhaps the 11/14/2015 notes are more adequate to provide for giving them what they need?

## 2016-04-22 NOTE — Telephone Encounter (Signed)
Dr. Sharlet Salina, I spoke to [facility]. She is saying that the office notes we have sent her do not have the correct diagnosis to cover the services provided by them. They helped the patient with PT, OT, ST, and had a Education officer, museum go into the home. She is requesting office notes from 11/14/2015 and a face to face signed and dated 11/14/2015. She is asking that we add diagnosis to the office notes from 11/14/2015 to cover ST and OT. The notes from that date already cover the other services. She is faxing me the face to face for you to sign again.

## 2016-04-22 NOTE — Telephone Encounter (Signed)
I have the face to face. I placed it with your forms to be signed.

## 2016-04-22 NOTE — Telephone Encounter (Signed)
We cannot change the date we requested services as that would be fraud. We can addend notes but cannot change the face to face date. The other thing we can do is change diagnoses on the face to face to match visit. Will address when face to face is present.

## 2016-04-22 NOTE — Telephone Encounter (Signed)
[name] is calling back in. She states that the o/v notes that she received state nothing about the services that they are providing. She is asking that the notes be addended to reflect notes stating the services that we are asking for. DOS given is 01/19/2016, and we saw him for other problems such as memory loss. Please clarify and resolve

## 2017-09-29 IMAGING — CT CT HEAD W/O CM
1 series · 15 of 30 positions shown, 19 images · non-contrast
Comparison: CT head November 29, 2015

CLINICAL DATA: Followup subdural hematoma, altered mental status.

EXAM:
CT HEAD WITHOUT CONTRAST
TECHNIQUE: Contiguous axial images were obtained from the base of the skull
through the vertex without intravenous contrast.

[Series 3: head 5.0 h30s · axial · 0.47mm/px · z∈[-152,-17]mm · 15 of 31 slices shown, 19 images]
[im 2/31  brain]
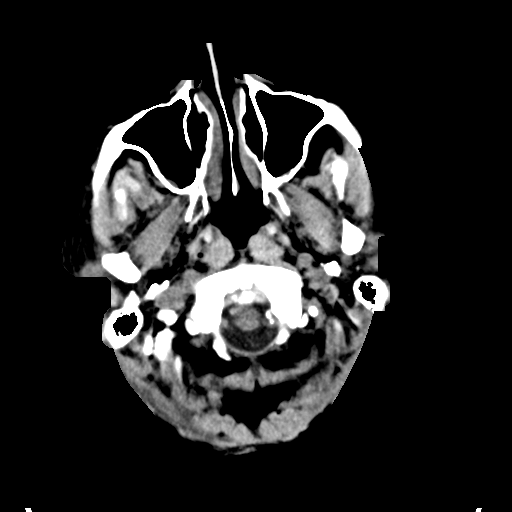
[im 2/31  bone]
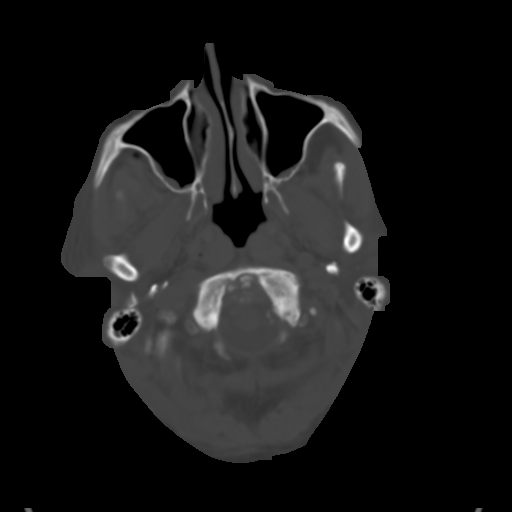
[im 4/31  brain]
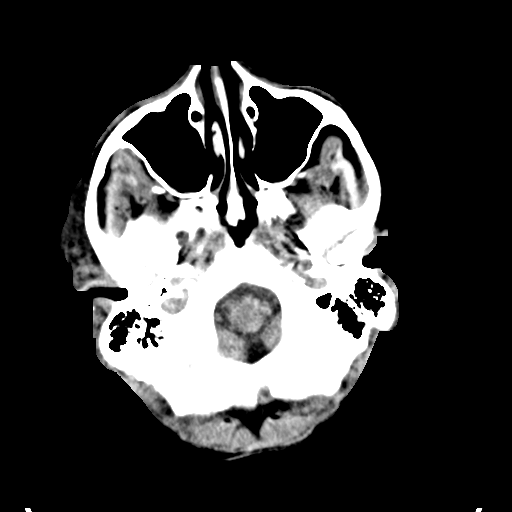
[im 6/31  brain]
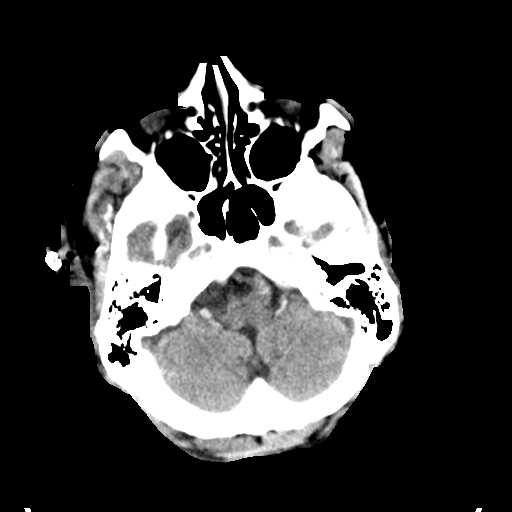
[im 8/31  brain]
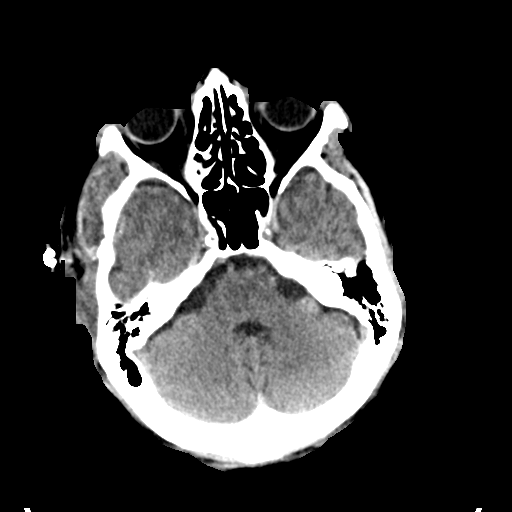
[im 10/31  brain]
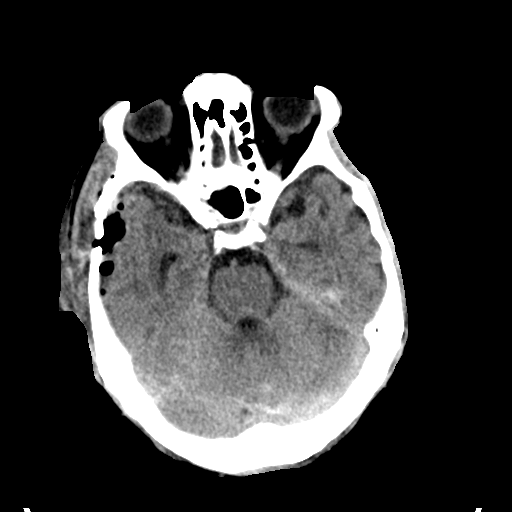
[im 10/31  bone]
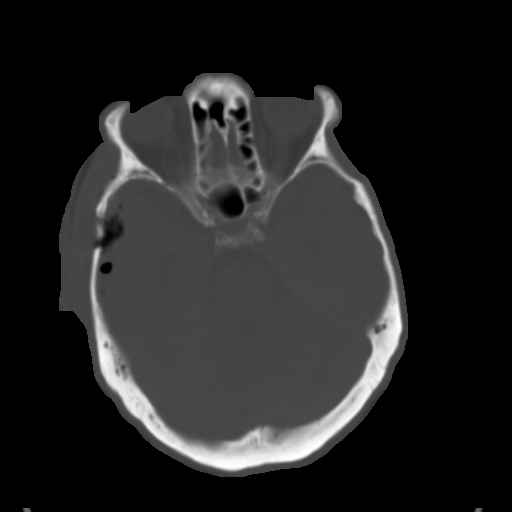
[im 12/31  brain]
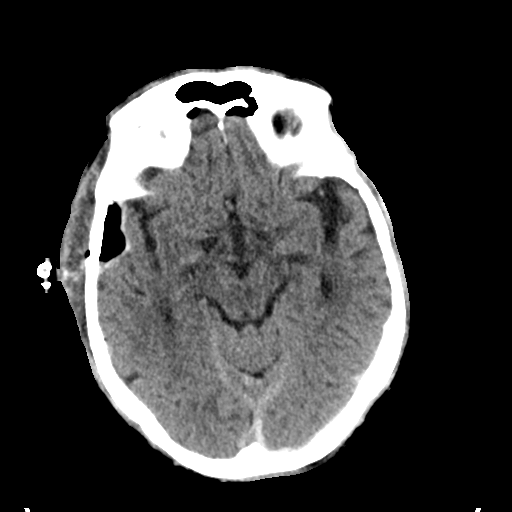
[im 14/31  brain]
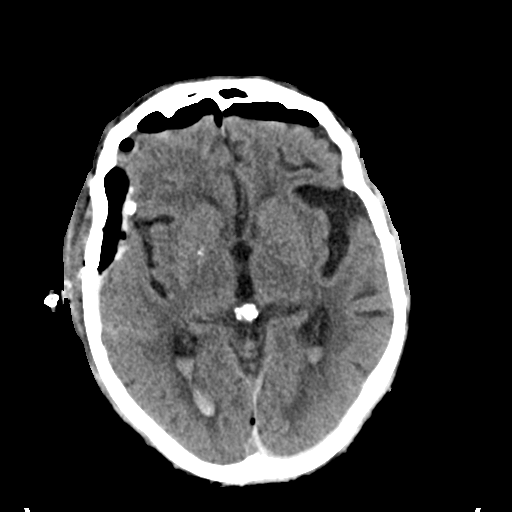
[im 16/31  brain]
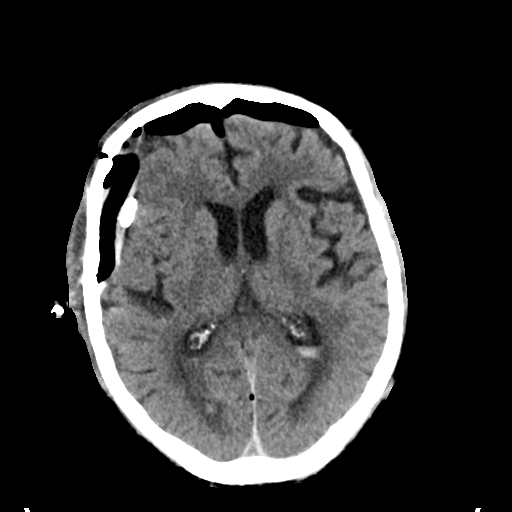
[im 17/31  brain]
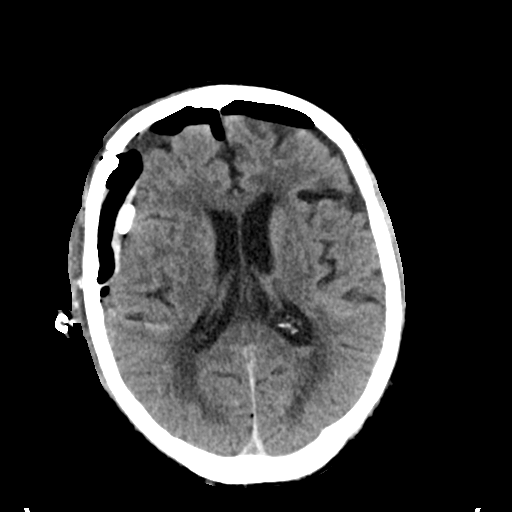
[im 17/31  bone]
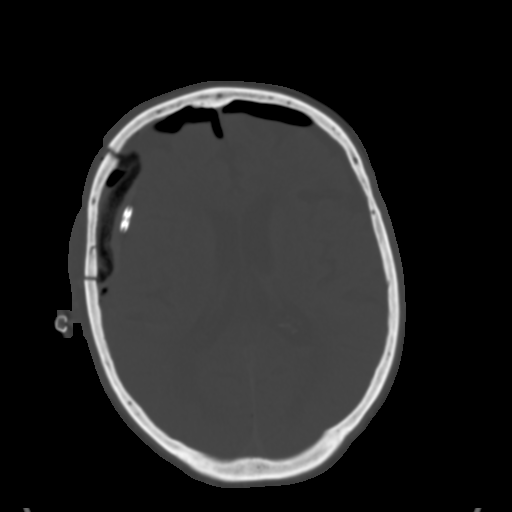
[im 19/31  brain]
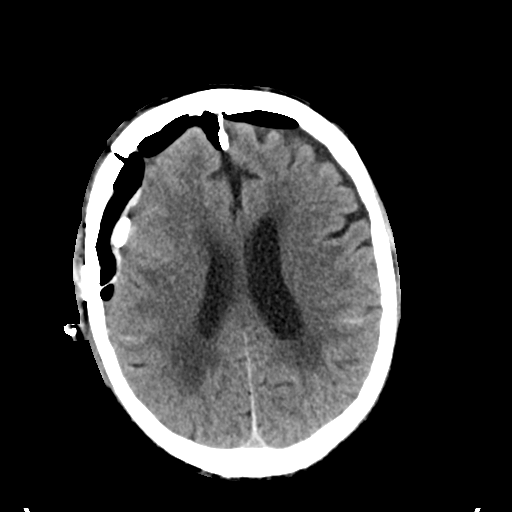
[im 21/31  brain]
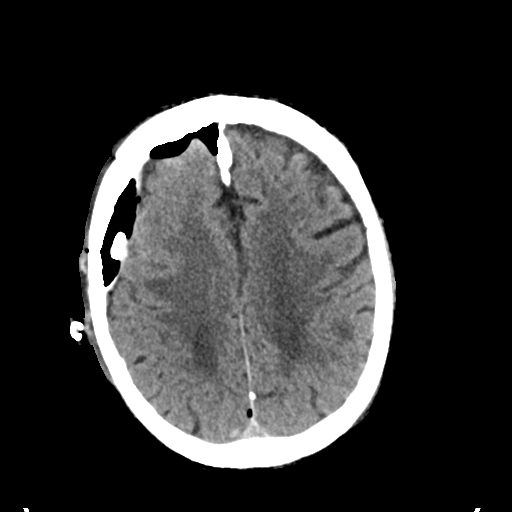
[im 23/31  brain]
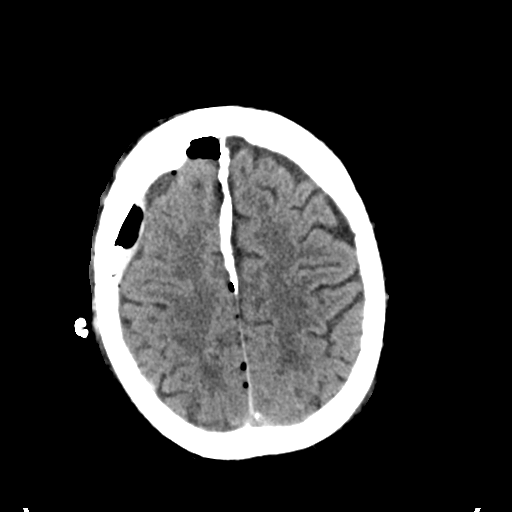
[im 25/31  brain]
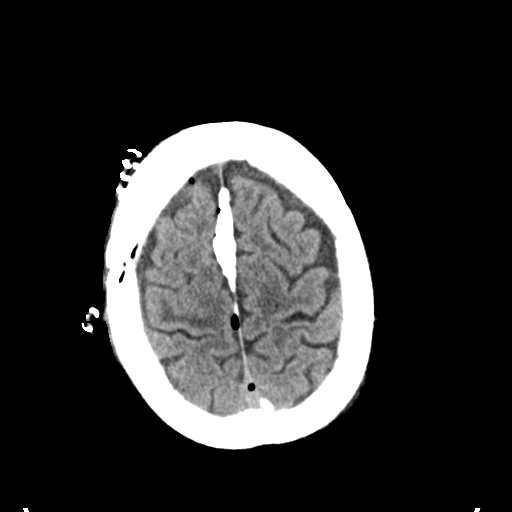
[im 25/31  bone]
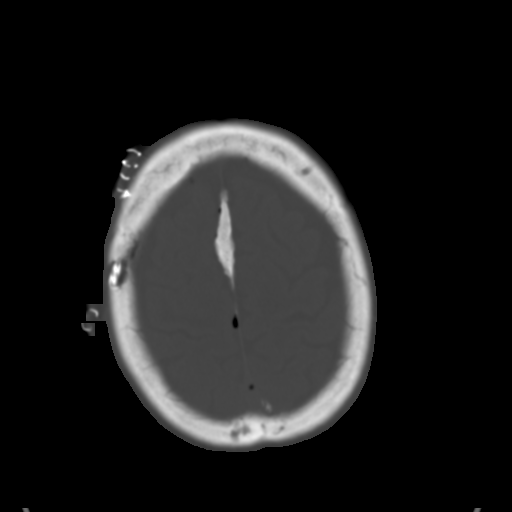
[im 27/31  brain]
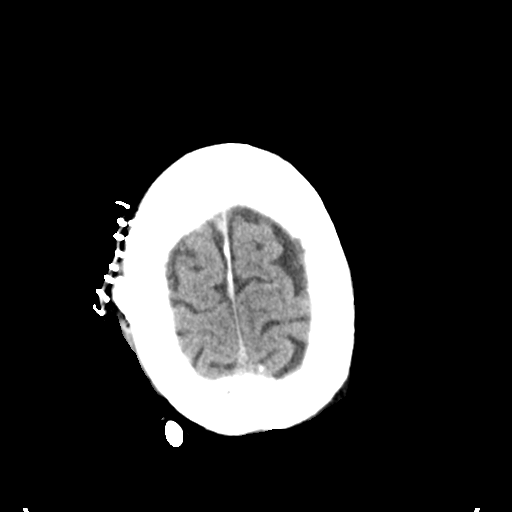
[im 29/31  brain]
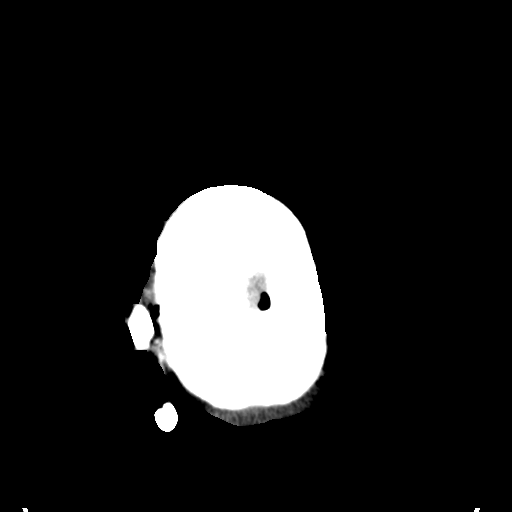

[15 of 30 positions shown; findings below may reference images not displayed]

FINDINGS: Status post RIGHT frontotemporal craniotomy with extra-axial
surgical drain and Surgicel subjacent to the craniotomy. Similar
extra-axial pneumocephalus without significant mass effect. Small
amount of residual subarachnoid blood and, based subdural hematomas
along the cerebellar tentorium. Layering blood products persists in
the occipital horns. 3 mm RIGHT to LEFT midline shift without
ventricular entrapment or hydrocephalus. No intraparenchymal
hemorrhage. No acute large vascular territory infarct. Patchy to
confluent supratentorial white matter hypodensities compatible with
chronic small vessel ischemic disease. Basal cisterns are patent.

Moderate to severe calcific atherosclerosis the carotid siphons.
Mild paranasal sinus mucosal thickening without air-fluid levels.
The mastoid air cells are well aerated. Mild RIGHT temporomandibular
osteoarthrosis. RIGHT scalp soft tissue swelling with skin staples.
IMPRESSION: Status post RIGHT craniotomy for evacuation of subdural hematoma
with extra-axial drain and extra-axial pneumocephalus similar to
prior study. No re-accumulation of subdural collection. Similar 3 mm
RIGHT to LEFT midline shift.

Trace residual subdural hematomas along the cerebellar tentorium
and, small amount of subarachnoid hemorrhage.

Subcentimeter RIGHT parietal subarachnoid hemorrhage, less likely
stable hematoma.

Similar intraventricular hemorrhage without hydrocephalus or
entrapment.

## 2017-10-01 IMAGING — CT CT HEAD W/O CM
2 series · 15 of 30 positions shown, 17 images · non-contrast
Comparison: Head CT dated [DATE].

CLINICAL DATA: Craniotomy on [REDACTED], drain placed. Now with
decreased mental status and extremely slurred speech.

EXAM:
CT HEAD WITHOUT CONTRAST
TECHNIQUE: Contiguous axial images were obtained from the base of the skull
through the vertex without intravenous contrast.

[Series 2: head without · axial · non-contrast · 0.47mm/px · z∈[-132,-12]mm · 7 of 33 slices shown, 9 images]
[im 5/33  brain]
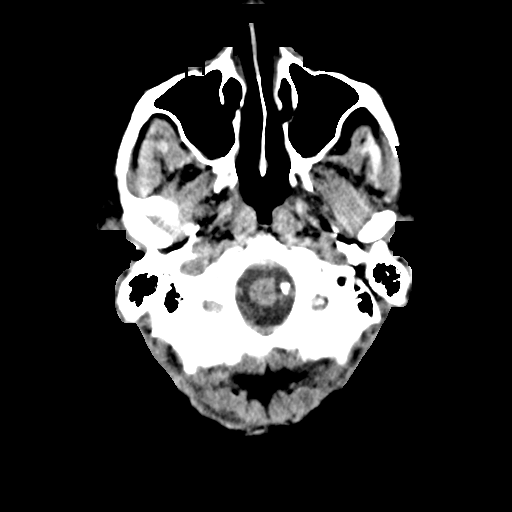
[im 5/33  bone]
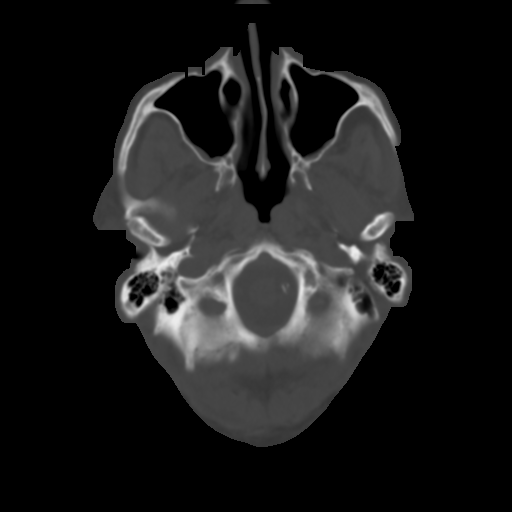
[im 9/33  brain]
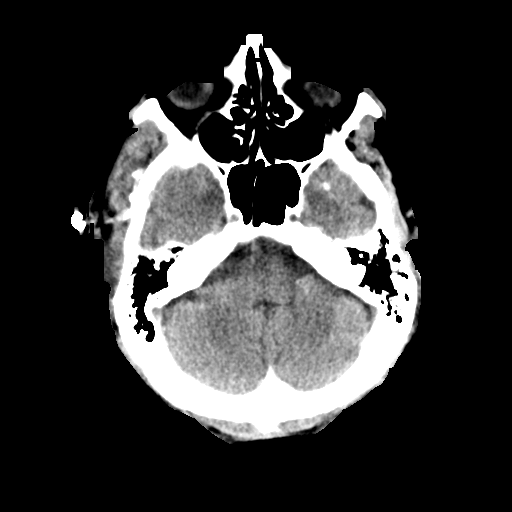
[im 13/33  brain]
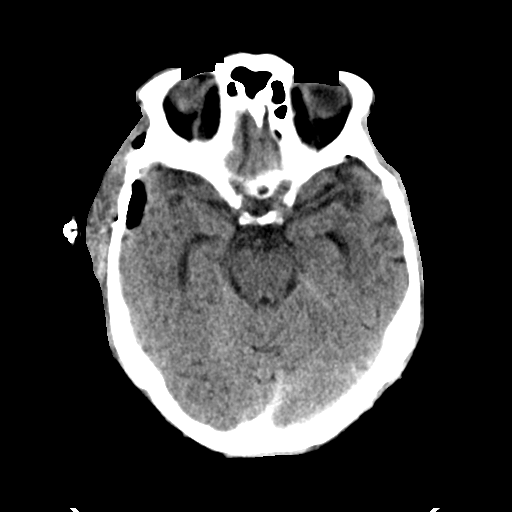
[im 17/33  brain]
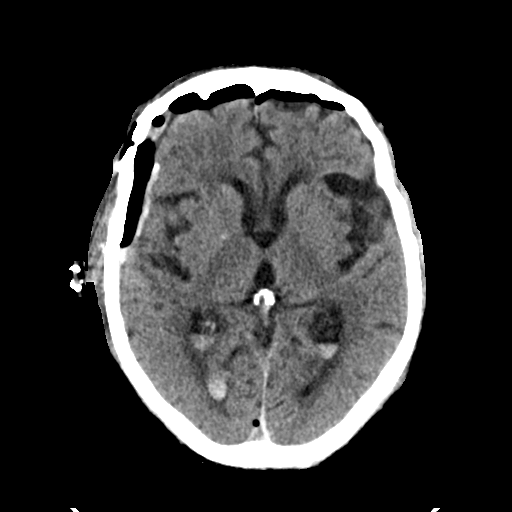
[im 21/33  brain]
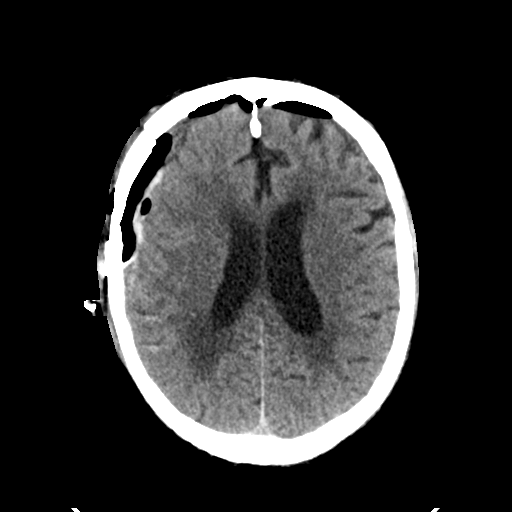
[im 21/33  bone]
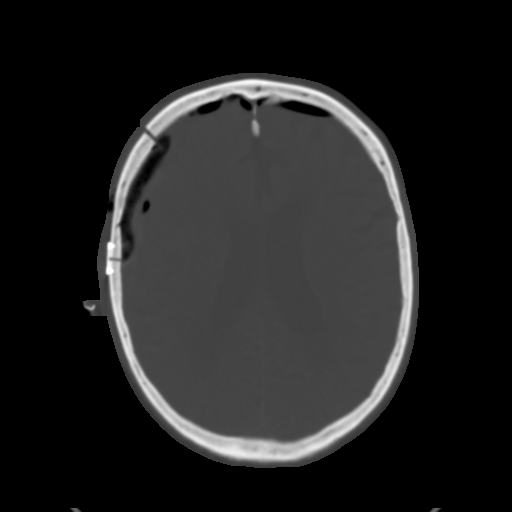
[im 25/33  brain]
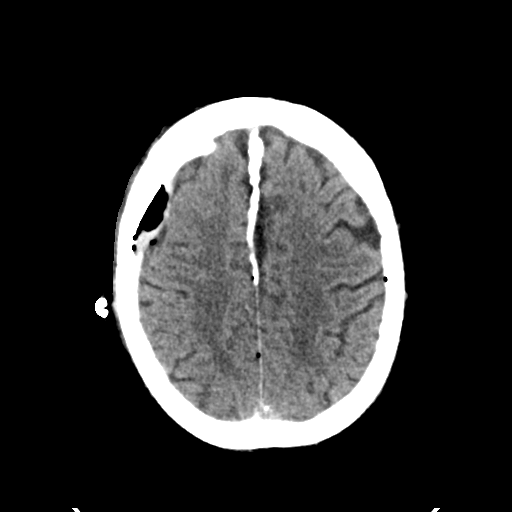
[im 29/33  brain]
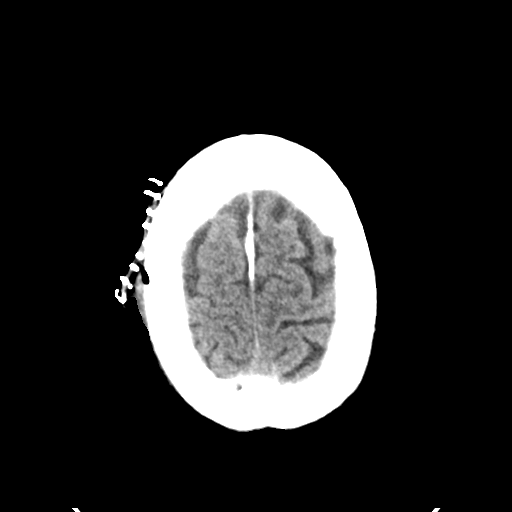

[Series 3: head bone · axial · 0.47mm/px · z∈[-136,-6]mm · 8 of 83 slices shown]
[im 9/83  bone]
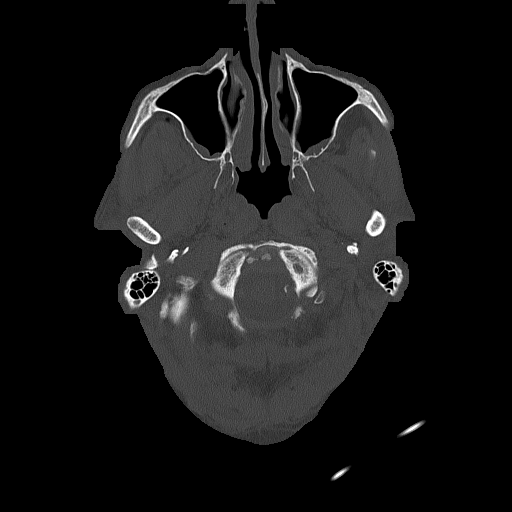
[im 17/83  bone]
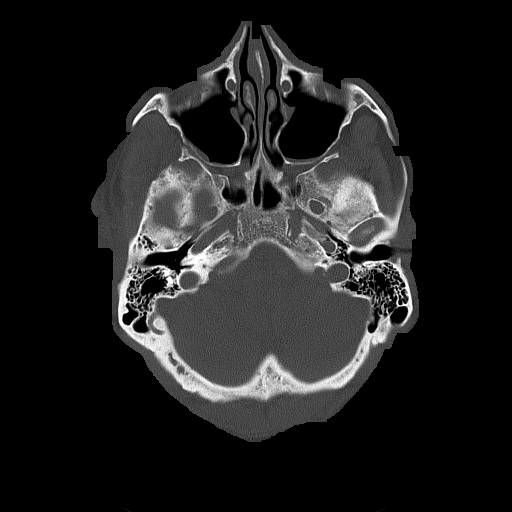
[im 25/83  bone]
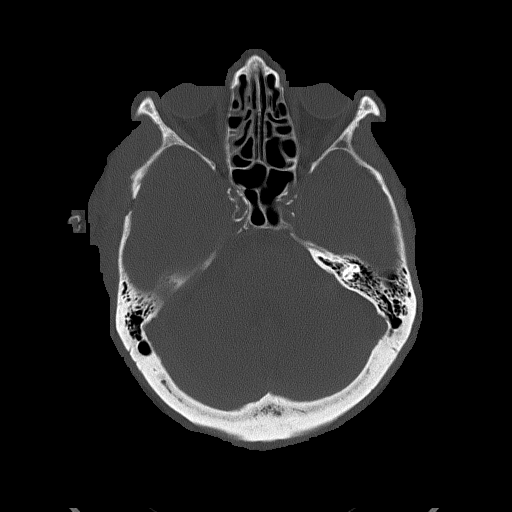
[im 37/83  bone]
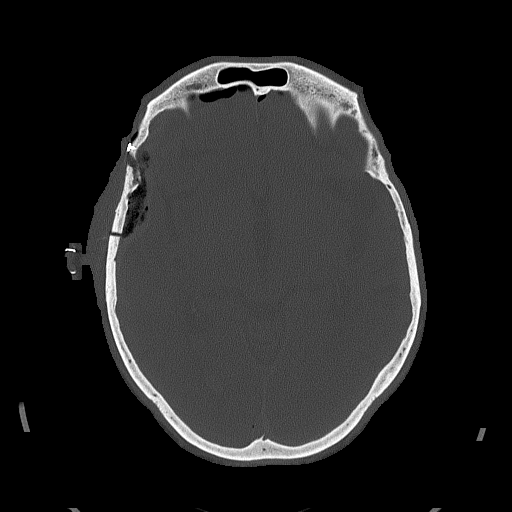
[im 46/83  bone]
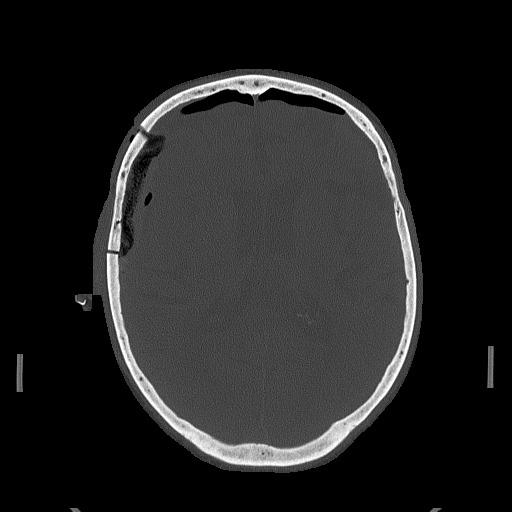
[im 58/83  bone]
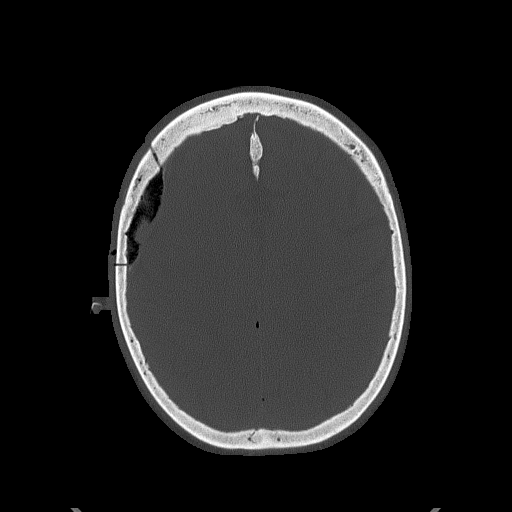
[im 66/83  bone]
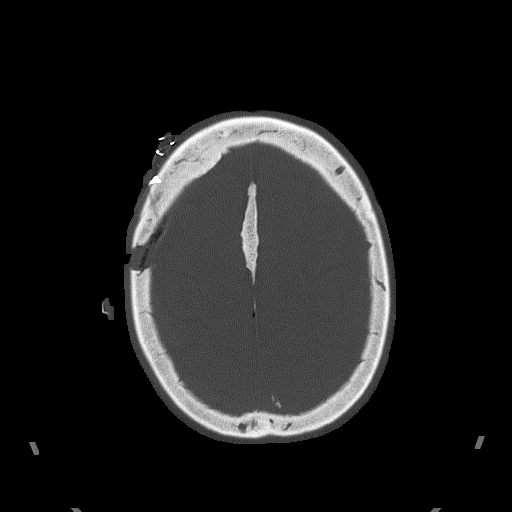
[im 74/83  bone]
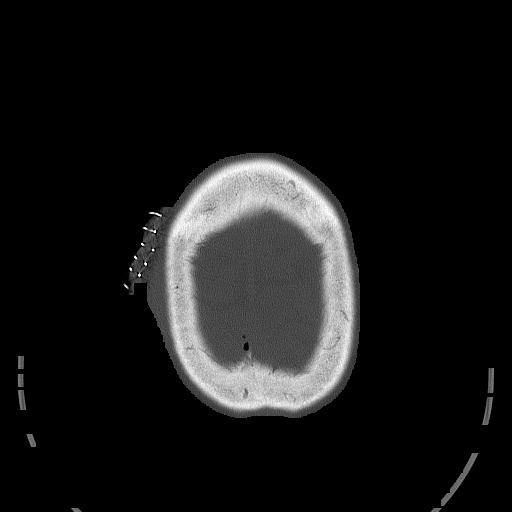

[15 of 30 positions shown; findings below may reference images not displayed]

FINDINGS: Again noted are surgical changes compatible with right
frontotemporal craniotomy. The extra-axial drain has been removed in
the interval. The pneumocephalus and a small amount of extra-axial
hemorrhage underlying the craniotomy site is without significant
change, perhaps minimally decreased. There is also a stable small
amount of extra-axial hemorrhage overlying the tentorium. Layering
hemorrhage within the occipital horns of each ventricle are also
unchanged in amount. Ventricles themselves are stable in size.

3 mm of leftward midline shift is stable. No parenchymal hemorrhage
or evidence of parenchymal edema. No new extra-axial hemorrhage.

Patchy areas of low density within the bilateral periventricular
subcortical white matter regions are unchanged and compatible with
sequela of chronic small vessel ischemia.

There are chronic calcified atherosclerotic changes of the large
vessels at the skull base. Mild mucosal thickening again noted
within the paranasal sinuses. Right scalp soft tissue swelling/edema
is grossly stable with associated surgical staples in place.
IMPRESSION: Postsurgical changes of right craniotomy for evacuation of subdural
hematoma. Extra-axial drain has been removed since the previous head
CT. Otherwise stable head CT. The amount of extra-axial hemorrhage
and pneumocephalus is not significantly changed, perhaps minimally
decreased. No reaccumulation of hemorrhage. No parenchymal
hemorrhage or evidence of parenchymal edema. Stable minimal leftward
midline shift.

## 2019-11-30 DEATH — deceased
